# Patient Record
Sex: Female | Born: 1945 | Race: White | Hispanic: No | Marital: Married | Smoking: Former smoker
Health system: Southern US, Community
[De-identification: ages and names within clinical notes are randomized; demographics above are authoritative.]

## PROBLEM LIST (undated history)

## (undated) DIAGNOSIS — M1990 Unspecified osteoarthritis, unspecified site: Secondary | ICD-10-CM

## (undated) DIAGNOSIS — F32A Depression, unspecified: Secondary | ICD-10-CM

## (undated) DIAGNOSIS — I6523 Occlusion and stenosis of bilateral carotid arteries: Secondary | ICD-10-CM

## (undated) DIAGNOSIS — N3281 Overactive bladder: Secondary | ICD-10-CM

## (undated) DIAGNOSIS — N3941 Urge incontinence: Secondary | ICD-10-CM

## (undated) HISTORY — DX: Urge incontinence: N39.41

## (undated) HISTORY — PX: ELBOW FRACTURE SURGERY: SHX616

## (undated) HISTORY — PX: FRACTURE SURGERY: SHX138

## (undated) HISTORY — PX: BUNIONECTOMY: SHX129

## (undated) HISTORY — PX: COLONOSCOPY: SHX174

---

## 2002-03-15 HISTORY — PX: BREAST BIOPSY: SHX20

## 2009-05-30 LAB — HM COLONOSCOPY

## 2011-06-08 DIAGNOSIS — M25529 Pain in unspecified elbow: Secondary | ICD-10-CM | POA: Insufficient documentation

## 2011-11-10 DIAGNOSIS — S42409A Unspecified fracture of lower end of unspecified humerus, initial encounter for closed fracture: Secondary | ICD-10-CM | POA: Insufficient documentation

## 2011-11-10 HISTORY — DX: Unspecified fracture of lower end of unspecified humerus, initial encounter for closed fracture: S42.409A

## 2013-03-15 HISTORY — PX: OTHER SURGICAL HISTORY: SHX169

## 2016-03-24 DIAGNOSIS — Z1231 Encounter for screening mammogram for malignant neoplasm of breast: Secondary | ICD-10-CM | POA: Diagnosis not present

## 2016-04-06 DIAGNOSIS — N3281 Overactive bladder: Secondary | ICD-10-CM | POA: Diagnosis not present

## 2016-04-06 DIAGNOSIS — Z0000 Encounter for general adult medical examination without abnormal findings: Secondary | ICD-10-CM | POA: Diagnosis not present

## 2016-04-06 DIAGNOSIS — G478 Other sleep disorders: Secondary | ICD-10-CM | POA: Diagnosis not present

## 2016-04-06 DIAGNOSIS — E784 Other hyperlipidemia: Secondary | ICD-10-CM | POA: Diagnosis not present

## 2016-04-06 DIAGNOSIS — K5730 Diverticulosis of large intestine without perforation or abscess without bleeding: Secondary | ICD-10-CM | POA: Diagnosis not present

## 2016-06-14 DIAGNOSIS — M79671 Pain in right foot: Secondary | ICD-10-CM | POA: Diagnosis not present

## 2016-06-14 DIAGNOSIS — M2011 Hallux valgus (acquired), right foot: Secondary | ICD-10-CM | POA: Diagnosis not present

## 2016-09-10 DIAGNOSIS — M19071 Primary osteoarthritis, right ankle and foot: Secondary | ICD-10-CM | POA: Diagnosis not present

## 2016-09-10 DIAGNOSIS — M2010 Hallux valgus (acquired), unspecified foot: Secondary | ICD-10-CM | POA: Insufficient documentation

## 2016-09-10 DIAGNOSIS — M2021 Hallux rigidus, right foot: Secondary | ICD-10-CM | POA: Diagnosis not present

## 2016-09-10 DIAGNOSIS — G8918 Other acute postprocedural pain: Secondary | ICD-10-CM | POA: Diagnosis not present

## 2016-09-10 DIAGNOSIS — M79671 Pain in right foot: Secondary | ICD-10-CM | POA: Diagnosis not present

## 2016-09-10 DIAGNOSIS — N393 Stress incontinence (female) (male): Secondary | ICD-10-CM | POA: Diagnosis not present

## 2016-09-10 DIAGNOSIS — Z87891 Personal history of nicotine dependence: Secondary | ICD-10-CM | POA: Diagnosis not present

## 2016-09-24 DIAGNOSIS — Z09 Encounter for follow-up examination after completed treatment for conditions other than malignant neoplasm: Secondary | ICD-10-CM | POA: Diagnosis not present

## 2016-10-13 DIAGNOSIS — Z09 Encounter for follow-up examination after completed treatment for conditions other than malignant neoplasm: Secondary | ICD-10-CM | POA: Diagnosis not present

## 2016-10-13 DIAGNOSIS — M79671 Pain in right foot: Secondary | ICD-10-CM | POA: Diagnosis not present

## 2016-12-21 DIAGNOSIS — Z09 Encounter for follow-up examination after completed treatment for conditions other than malignant neoplasm: Secondary | ICD-10-CM | POA: Diagnosis not present

## 2016-12-31 DIAGNOSIS — Z23 Encounter for immunization: Secondary | ICD-10-CM | POA: Diagnosis not present

## 2017-02-18 DIAGNOSIS — E559 Vitamin D deficiency, unspecified: Secondary | ICD-10-CM | POA: Diagnosis not present

## 2017-02-18 DIAGNOSIS — R350 Frequency of micturition: Secondary | ICD-10-CM | POA: Diagnosis not present

## 2017-02-18 DIAGNOSIS — E785 Hyperlipidemia, unspecified: Secondary | ICD-10-CM | POA: Diagnosis not present

## 2017-02-18 DIAGNOSIS — I10 Essential (primary) hypertension: Secondary | ICD-10-CM | POA: Diagnosis not present

## 2017-02-25 DIAGNOSIS — E559 Vitamin D deficiency, unspecified: Secondary | ICD-10-CM | POA: Diagnosis not present

## 2017-02-25 DIAGNOSIS — R350 Frequency of micturition: Secondary | ICD-10-CM | POA: Diagnosis not present

## 2017-02-25 DIAGNOSIS — E785 Hyperlipidemia, unspecified: Secondary | ICD-10-CM | POA: Diagnosis not present

## 2017-02-25 HISTORY — DX: Hyperlipidemia, unspecified: E78.5

## 2017-03-30 DIAGNOSIS — Z1231 Encounter for screening mammogram for malignant neoplasm of breast: Secondary | ICD-10-CM | POA: Diagnosis not present

## 2017-11-29 ENCOUNTER — Emergency Department
Admission: EM | Admit: 2017-11-29 | Discharge: 2017-11-29 | Disposition: A | Discharge status: Home / Self Care | Payer: Medicare Other | Attending: Emergency Medicine | Admitting: Emergency Medicine

## 2017-11-29 ENCOUNTER — Other Ambulatory Visit: Payer: Self-pay

## 2017-11-29 ENCOUNTER — Emergency Department: Payer: Medicare Other

## 2017-11-29 DIAGNOSIS — Y929 Unspecified place or not applicable: Secondary | ICD-10-CM | POA: Insufficient documentation

## 2017-11-29 DIAGNOSIS — S52021A Displaced fracture of olecranon process without intraarticular extension of right ulna, initial encounter for closed fracture: Secondary | ICD-10-CM | POA: Diagnosis not present

## 2017-11-29 DIAGNOSIS — W0110XA Fall on same level from slipping, tripping and stumbling with subsequent striking against unspecified object, initial encounter: Secondary | ICD-10-CM | POA: Insufficient documentation

## 2017-11-29 DIAGNOSIS — Z23 Encounter for immunization: Secondary | ICD-10-CM | POA: Diagnosis not present

## 2017-11-29 DIAGNOSIS — Y999 Unspecified external cause status: Secondary | ICD-10-CM | POA: Insufficient documentation

## 2017-11-29 DIAGNOSIS — Y939 Activity, unspecified: Secondary | ICD-10-CM | POA: Insufficient documentation

## 2017-11-29 DIAGNOSIS — S59902A Unspecified injury of left elbow, initial encounter: Secondary | ICD-10-CM | POA: Diagnosis present

## 2017-11-29 MED ORDER — CEPHALEXIN 500 MG PO CAPS: 1000.0000 mg | ORAL_CAPSULE | Freq: Two times a day (BID) | ORAL | 0 refills | Status: DC

## 2017-11-29 MED ORDER — OXYCODONE-ACETAMINOPHEN 5-325 MG PO TABS
1.0000 | ORAL_TABLET | Freq: Once | ORAL | Status: AC
  Administered 2017-11-29: 1 via ORAL
  Filled 2017-11-29: qty 1

## 2017-11-29 MED ORDER — TETANUS-DIPHTH-ACELL PERTUSSIS 5-2.5-18.5 LF-MCG/0.5 IM SUSP
0.5000 mL | Freq: Once | INTRAMUSCULAR | Status: AC
  Administered 2017-11-29: 0.5 mL via INTRAMUSCULAR
  Filled 2017-11-29: qty 0.5

## 2017-11-29 MED ORDER — OXYCODONE-ACETAMINOPHEN 5-325 MG PO TABS: 1.0000 | ORAL_TABLET | Freq: Four times a day (QID) | ORAL | 0 refills | Status: DC | PRN

## 2017-11-29 MED ORDER — ONDANSETRON 8 MG PO TBDP
8.0000 mg | ORAL_TABLET | Freq: Once | ORAL | Status: AC
  Administered 2017-11-29: 8 mg via ORAL
  Filled 2017-11-29: qty 1

## 2017-11-29 NOTE — ED Provider Notes (Signed)
[facility] Emergency Department Provider Note  ____________________________________________  Time seen: Approximately 6:42 PM  I have reviewed the triage vital signs and the nursing notes.   HISTORY  Chief Complaint Fall    HPI Julia Snyder is a 72 y.o. female who presents the emergency department complaining of right elbow injury.  Patient reports that she was walking, believes she tripped over an unknown object, falling and landing on her elbow.  Patient reports immediate pain to the area and inability to move due to the significant amount of pain.  Patient did not hit her head or lose consciousness.  She did sustain abrasions to the forearm and elbow.  No active bleeding.  She is unsure of her last tetanus shot.  Patient's main complaint at this time is severe elbow pain.  Patient had significant surgery to the left elbow but has had no right elbow/arm pain.    History reviewed. No pertinent past medical history.  There are no active problems to display for this patient.   Past Surgical History:  Procedure Laterality Date  . OTHER SURGICAL HISTORY  2015   left arm surgery with titanium plate    Prior to Admission medications   Medication Sig Start Date End Date Taking? Authorizing Provider  cephALEXin (KEFLEX) 500 MG capsule Take 2 capsules (1,000 mg total) by mouth 2 (two) times daily. 11/29/17   Cuthriell, Delorise RoyalsJonathan D, PA-C  oxyCODONE-acetaminophen (PERCOCET/ROXICET) 5-325 MG tablet Take 1 tablet by mouth every 6 (six) hours as needed for severe pain. 11/29/17   Cuthriell, Delorise RoyalsJonathan D, PA-C    Allergies Patient has no allergy information on record.  History reviewed. No pertinent family history.  Social History Social History   Tobacco Use  . Smoking status: Never Smoker  . Smokeless tobacco: Never Used  Substance Use Topics  . Alcohol use: Yes  . Drug use: Never     Review of Systems  Constitutional: No fever/chills Eyes: No visual  changes.  Cardiovascular: no chest pain. Respiratory: no cough. No SOB. Gastrointestinal: No abdominal pain.  No nausea, no vomiting.   Musculoskeletal: Positive for right elbow injury Skin: Abrasions to the right elbow and forearm Neurological: Negative for headaches, focal weakness or numbness. 10-point ROS otherwise negative.  ____________________________________________   PHYSICAL EXAM:  VITAL SIGNS: ED Triage Vitals  Enc Vitals Group     BP 11/29/17 1736 (!) 134/54     Pulse Rate 11/29/17 1736 (!) 54     Resp 11/29/17 1736 20     Temp 11/29/17 1736 97.8 F (36.6 C)     Temp Source 11/29/17 1736 Oral     SpO2 11/29/17 1736 99 %     Weight 11/29/17 1731 121 lb (54.9 kg)     Height 11/29/17 1731 5\' 2"  (1.575 m)     Head Circumference --      Peak Flow --      Pain Score 11/29/17 1729 10     Pain Loc --      Pain Edu? --      Excl. in GC? --      Constitutional: Alert and oriented. Well appearing and in no acute distress. Eyes: Conjunctivae are normal. PERRL. EOMI. Head: Atraumatic. Neck: No stridor.    Cardiovascular: Normal rate, regular rhythm. Normal S1 and S2.  Good peripheral circulation. Respiratory: Normal respiratory effort without tachypnea or retractions. Lungs CTAB. Good air entry to the bases with no decreased or absent breath sounds. Musculoskeletal: Full range of  motion to all extremities. No gross deformities appreciated.  Visualization of the right elbow reveals edema to the posterior aspect.  Patient does have superficial abrasions from elbow into the forearm.  No range of motion due to pain.  Palpation along the elbow reveals posteriorly displaced olecranon but no other palpable abnormality.  Extreme tenderness to palpation in this region.  No range of motion testing is performed.  No other significant tenderness or palpable abnormality to the right elbow.  Examination of the right shoulder and right wrist is unremarkable.  Radial pulse intact distally.   Sensation intact distally. Neurologic:  Normal speech and language. No gross focal neurologic deficits are appreciated.  Skin:  Skin is warm, dry and intact. No rash noted. Psychiatric: Mood and affect are normal. Speech and behavior are normal. Patient exhibits appropriate insight and judgement.   ____________________________________________   LABS (all labs ordered are listed, but only abnormal results are displayed)  Labs Reviewed - No data to display ____________________________________________  EKG   ____________________________________________  RADIOLOGY I personally viewed and evaluated these images as part of my medical decision making, as well as reviewing the written report by the radiologist.  I concur with radiologist finding of comminuted olecranon fracture with posterior displacement.  Visualized wedge-shaped fracture fragment is also appreciated.  Dg Elbow 2 Views Right  Result Date: 11/29/2017 CLINICAL DATA:  Right elbow pain after fall EXAM: RIGHT ELBOW - 2 VIEW COMPARISON:  None. FINDINGS: Acute, closed, comminuted fracture of the olecranon with 4 cm of distraction of the main fracture fragments. A wedge shaped fracture fragment measuring 18 x 18 x 9 mm is seen interposed within the gap created by the fracture. The radius and distal humerus appear intact. IMPRESSION: Acute, comminuted fracture of the olecranon with 4 cm of distraction noted. An 18 x 18 x 9 mm wedge-shaped fracture fragment is also seen interposed between the main fracture fragments. Electronically Signed   By: Tollie Eth M.D.   On: 11/29/2017 18:16    ____________________________________________    PROCEDURES  Procedure(s) performed:    .Splint Application Date/Time: 11/29/2017 7:12 PM Performed by: Racheal Patches, PA-C Authorized by: Racheal Patches, PA-C   Consent:    Consent obtained:  Verbal   Consent given by:  Patient   Risks discussed:  Pain and swelling Pre-procedure  details:    Sensation:  Normal Procedure details:    Laterality:  Right   Location:  Elbow   Elbow:  R elbow   Splint type:  Long arm   Supplies:  Cotton padding, Ortho-Glass and elastic bandage Post-procedure details:    Pain:  Improved   Sensation:  Normal   Patient tolerance of procedure:  Tolerated well, no immediate complications      Medications  oxyCODONE-acetaminophen (PERCOCET/ROXICET) 5-325 MG per tablet 1 tablet (1 tablet Oral Given 11/29/17 1910)  ondansetron (ZOFRAN-ODT) disintegrating tablet 8 mg (8 mg Oral Given 11/29/17 1912)  Tdap (BOOSTRIX) injection 0.5 mL (0.5 mLs Intramuscular Given 11/29/17 1911)     ____________________________________________   INITIAL IMPRESSION / ASSESSMENT AND PLAN / ED COURSE  Pertinent labs & imaging results that were available during my care of the patient were reviewed by me and considered in my medical decision making (see chart for details).  Review of the [place] CSRS was performed in accordance of the [facility] prior to dispensing any controlled drugs.      Patient's diagnosis is consistent with olecranon fracture.  Patient presents the emergency department with injury  to the right elbow.  Palpation revealed palpable deformity around the olecranon process.  Imaging reveals comminuted fracture with posterior displacement.  Patient will be splinted with long-arm posterior OCL splint.  Patient is updated with tetanus shot.  She will be prescribed pain medication, antibiotics prophylactically.. I discussed the case with on-call orthopedic surgeon who advises that he will follow the patient in the morning. Patient is given ED precautions to return to the ED for any worsening or new symptoms.     ____________________________________________  FINAL CLINICAL IMPRESSION(S) / ED DIAGNOSES  Final diagnoses:  Closed fracture of right olecranon process, initial encounter      NEW MEDICATIONS STARTED DURING THIS VISIT:  ED Discharge Orders          Ordered    cephALEXin (KEFLEX) 500 MG capsule  2 times daily     11/29/17 1959    oxyCODONE-acetaminophen (PERCOCET/ROXICET) 5-325 MG tablet  Every 6 hours PRN     11/29/17 1959              This chart was dictated using voice recognition software/Dragon. Despite best efforts to proofread, errors can occur which can change the meaning. Any change was purely unintentional.    Racheal Patches, PA-C 11/29/17 2353    Jeanmarie Plant, MD 11/30/17 0000

## 2017-11-29 NOTE — ED Triage Notes (Signed)
Pt arrives to ed via POV. Pt states she tripped over shoe on sidewalk. Pt a&o x 4. Pt denies hitting head, no LOC. Ice pack on right arm. Pt stated hit elbow on concrete, skin abrasions and bruising noted to the right elbow and forearm, currently not bleeding.

## 2017-11-30 DIAGNOSIS — W101XXA Fall (on)(from) sidewalk curb, initial encounter: Secondary | ICD-10-CM | POA: Diagnosis not present

## 2017-11-30 DIAGNOSIS — S52031A Displaced fracture of olecranon process with intraarticular extension of right ulna, initial encounter for closed fracture: Secondary | ICD-10-CM | POA: Diagnosis not present

## 2017-12-01 ENCOUNTER — Ambulatory Visit: Payer: Medicare Other

## 2017-12-01 ENCOUNTER — Ambulatory Visit: Payer: Medicare Other | Admitting: Anesthesiology

## 2017-12-01 ENCOUNTER — Encounter
Admission: RE | Disposition: A | Discharge status: Home / Self Care | Payer: Self-pay | Source: Ambulatory Visit | Attending: Orthopedic Surgery

## 2017-12-01 ENCOUNTER — Ambulatory Visit
Admission: RE | Admit: 2017-12-01 | Discharge: 2017-12-01 | Disposition: A | Discharge status: Home / Self Care | Payer: Medicare Other | Source: Ambulatory Visit | Attending: Orthopedic Surgery | Admitting: Orthopedic Surgery

## 2017-12-01 ENCOUNTER — Other Ambulatory Visit: Payer: Self-pay

## 2017-12-01 ENCOUNTER — Encounter: Payer: Self-pay | Admitting: *Deleted

## 2017-12-01 DIAGNOSIS — S52001A Unspecified fracture of upper end of right ulna, initial encounter for closed fracture: Secondary | ICD-10-CM | POA: Diagnosis not present

## 2017-12-01 DIAGNOSIS — Z79899 Other long term (current) drug therapy: Secondary | ICD-10-CM | POA: Diagnosis not present

## 2017-12-01 DIAGNOSIS — S52031A Displaced fracture of olecranon process with intraarticular extension of right ulna, initial encounter for closed fracture: Secondary | ICD-10-CM | POA: Diagnosis not present

## 2017-12-01 DIAGNOSIS — W010XXA Fall on same level from slipping, tripping and stumbling without subsequent striking against object, initial encounter: Secondary | ICD-10-CM | POA: Diagnosis not present

## 2017-12-01 DIAGNOSIS — Z8781 Personal history of (healed) traumatic fracture: Secondary | ICD-10-CM

## 2017-12-01 DIAGNOSIS — N3281 Overactive bladder: Secondary | ICD-10-CM | POA: Diagnosis not present

## 2017-12-01 DIAGNOSIS — Y9289 Other specified places as the place of occurrence of the external cause: Secondary | ICD-10-CM | POA: Diagnosis not present

## 2017-12-01 DIAGNOSIS — Z9889 Other specified postprocedural states: Secondary | ICD-10-CM

## 2017-12-01 DIAGNOSIS — S52021A Displaced fracture of olecranon process without intraarticular extension of right ulna, initial encounter for closed fracture: Secondary | ICD-10-CM | POA: Diagnosis not present

## 2017-12-01 DIAGNOSIS — S52011A Torus fracture of upper end of right ulna, initial encounter for closed fracture: Secondary | ICD-10-CM | POA: Diagnosis not present

## 2017-12-01 DIAGNOSIS — Z01810 Encounter for preprocedural cardiovascular examination: Secondary | ICD-10-CM | POA: Diagnosis not present

## 2017-12-01 HISTORY — PX: ORIF ELBOW FRACTURE: SHX5031

## 2017-12-01 HISTORY — DX: Overactive bladder: N32.81

## 2017-12-01 SURGERY — OPEN REDUCTION INTERNAL FIXATION (ORIF) ELBOW/OLECRANON FRACTURE
Anesthesia: General | Site: Elbow | Laterality: Right

## 2017-12-01 MED ORDER — CEFAZOLIN SODIUM-DEXTROSE 2-4 GM/100ML-% IV SOLN
INTRAVENOUS | Status: AC
  Filled 2017-12-01: qty 100

## 2017-12-01 MED ORDER — ACETAMINOPHEN 10 MG/ML IV SOLN
INTRAVENOUS | Status: DC | PRN
  Administered 2017-12-01: 1000 mg via INTRAVENOUS

## 2017-12-01 MED ORDER — FENTANYL CITRATE (PF) 100 MCG/2ML IJ SOLN
INTRAMUSCULAR | Status: AC
  Administered 2017-12-01: 25 ug via INTRAVENOUS
  Filled 2017-12-01: qty 2

## 2017-12-01 MED ORDER — MIDAZOLAM HCL 2 MG/2ML IJ SOLN
INTRAMUSCULAR | Status: AC
  Administered 2017-12-01: 2 mg via INTRAVENOUS
  Filled 2017-12-01: qty 2

## 2017-12-01 MED ORDER — BUPIVACAINE LIPOSOME 1.3 % IJ SUSP
INTRAMUSCULAR | Status: AC
  Filled 2017-12-01: qty 20

## 2017-12-01 MED ORDER — LIDOCAINE HCL (PF) 1 % IJ SOLN
INTRAMUSCULAR | Status: AC
  Filled 2017-12-01: qty 5

## 2017-12-01 MED ORDER — OXYCODONE-ACETAMINOPHEN 5-325 MG PO TABS: 1.0000 | ORAL_TABLET | ORAL | 0 refills | Status: DC | PRN

## 2017-12-01 MED ORDER — NEOMYCIN-POLYMYXIN B GU 40-200000 IR SOLN
Status: DC | PRN
  Administered 2017-12-01: 2 mL

## 2017-12-01 MED ORDER — CEFAZOLIN SODIUM-DEXTROSE 2-4 GM/100ML-% IV SOLN
2.0000 g | Freq: Once | INTRAVENOUS | Status: AC
  Administered 2017-12-01: 2 g via INTRAVENOUS

## 2017-12-01 MED ORDER — ACETAMINOPHEN 10 MG/ML IV SOLN
INTRAVENOUS | Status: AC
  Filled 2017-12-01: qty 100

## 2017-12-01 MED ORDER — GLYCOPYRROLATE 0.2 MG/ML IJ SOLN
INTRAMUSCULAR | Status: DC | PRN
  Administered 2017-12-01: 0.2 mg via INTRAVENOUS

## 2017-12-01 MED ORDER — ONDANSETRON HCL 4 MG/2ML IJ SOLN
INTRAMUSCULAR | Status: DC | PRN
  Administered 2017-12-01: 4 mg via INTRAVENOUS

## 2017-12-01 MED ORDER — BUPIVACAINE HCL (PF) 0.5 % IJ SOLN
INTRAMUSCULAR | Status: DC | PRN
  Administered 2017-12-01: 10 mL via PERINEURAL
  Administered 2017-12-01: 10 mL

## 2017-12-01 MED ORDER — FENTANYL CITRATE (PF) 100 MCG/2ML IJ SOLN
25.0000 ug | Freq: Once | INTRAMUSCULAR | Status: AC
  Administered 2017-12-01: 25 ug via INTRAVENOUS

## 2017-12-01 MED ORDER — FENTANYL CITRATE (PF) 100 MCG/2ML IJ SOLN
INTRAMUSCULAR | Status: AC
  Filled 2017-12-01: qty 2

## 2017-12-01 MED ORDER — DEXAMETHASONE SODIUM PHOSPHATE 10 MG/ML IJ SOLN
INTRAMUSCULAR | Status: DC | PRN
  Administered 2017-12-01: 10 mg via INTRAVENOUS

## 2017-12-01 MED ORDER — MIDAZOLAM HCL 2 MG/2ML IJ SOLN
2.0000 mg | Freq: Once | INTRAMUSCULAR | Status: AC
  Administered 2017-12-01: 2 mg via INTRAVENOUS

## 2017-12-01 MED ORDER — LACTATED RINGERS IV SOLN
INTRAVENOUS | Status: DC
  Administered 2017-12-01: 14:00:00 via INTRAVENOUS

## 2017-12-01 MED ORDER — FAMOTIDINE 20 MG PO TABS: 20.0000 mg | ORAL_TABLET | Freq: Once | ORAL | Status: DC

## 2017-12-01 MED ORDER — BUPIVACAINE HCL (PF) 0.5 % IJ SOLN
INTRAMUSCULAR | Status: AC
  Filled 2017-12-01: qty 10

## 2017-12-01 MED ORDER — EPHEDRINE SULFATE 50 MG/ML IJ SOLN
INTRAMUSCULAR | Status: DC | PRN
  Administered 2017-12-01: 5 mg via INTRAVENOUS

## 2017-12-01 MED ORDER — NEOMYCIN-POLYMYXIN B GU 40-200000 IR SOLN
Status: AC
  Filled 2017-12-01: qty 2

## 2017-12-01 MED ORDER — SEVOFLURANE IN SOLN
RESPIRATORY_TRACT | Status: AC
  Filled 2017-12-01: qty 500

## 2017-12-01 MED ORDER — PROPOFOL 10 MG/ML IV BOLUS
INTRAVENOUS | Status: DC | PRN
  Administered 2017-12-01: 200 mg via INTRAVENOUS

## 2017-12-01 MED ORDER — BUPIVACAINE LIPOSOME 1.3 % IJ SUSP
INTRAMUSCULAR | Status: DC | PRN
  Administered 2017-12-01: 20 mL via PERINEURAL

## 2017-12-01 SURGICAL SUPPLY — 50 items
BANDAGE ELASTIC 4 LF NS (GAUZE/BANDAGES/DRESSINGS) ×6 IMPLANT
BIT DRILL 2.5X2.75 QC CALB (BIT) ×2 IMPLANT
BIT DRILL CALIBRATED 2.7 (BIT) ×1 IMPLANT
BIT DRILL CALIBRATED 2.7MM (BIT) ×1
CANISTER SUCT 1200ML W/VALVE (MISCELLANEOUS) ×3 IMPLANT
CHLORAPREP W/TINT 26ML (MISCELLANEOUS) ×3 IMPLANT
CLOSURE WOUND 1/2 X4 (GAUZE/BANDAGES/DRESSINGS) ×1
CUFF TOURN 18 STER (MISCELLANEOUS) ×3 IMPLANT
CUFF TOURN 24 STER (MISCELLANEOUS) ×3 IMPLANT
DRAPE C-ARM XRAY 36X54 (DRAPES) ×3 IMPLANT
DRAPE SHEET LG 3/4 BI-LAMINATE (DRAPES) ×3 IMPLANT
ELECT CAUTERY BLADE 6.4 (BLADE) ×3 IMPLANT
ELECT REM PT RETURN 9FT ADLT (ELECTROSURGICAL) ×3
ELECTRODE REM PT RTRN 9FT ADLT (ELECTROSURGICAL) ×1 IMPLANT
GAUZE PETRO XEROFOAM 1X8 (MISCELLANEOUS) ×3 IMPLANT
GAUZE SPONGE 4X4 12PLY STRL (GAUZE/BANDAGES/DRESSINGS) ×3 IMPLANT
GLOVE SURG SYN 9.0  PF PI (GLOVE) ×2
GLOVE SURG SYN 9.0 PF PI (GLOVE) ×1 IMPLANT
GOWN SRG 2XL LVL 4 RGLN SLV (GOWNS) ×1 IMPLANT
GOWN STRL NON-REIN 2XL LVL4 (GOWNS) ×2
GOWN STRL REUS W/ TWL LRG LVL3 (GOWN DISPOSABLE) ×1 IMPLANT
GOWN STRL REUS W/TWL LRG LVL3 (GOWN DISPOSABLE) ×2
K-WIRE FIXATION 2.0X6 (WIRE) ×3
KIT TURNOVER KIT A (KITS) ×3 IMPLANT
KWIRE FIXATION 2.0X6 (WIRE) IMPLANT
NDL FILTER BLUNT 18X1 1/2 (NEEDLE) ×1 IMPLANT
NEEDLE FILTER BLUNT 18X 1/2SAF (NEEDLE) ×2
NEEDLE FILTER BLUNT 18X1 1/2 (NEEDLE) ×1 IMPLANT
NS IRRIG 500ML POUR BTL (IV SOLUTION) ×3 IMPLANT
PACK EXTREMITY ARMC (MISCELLANEOUS) ×3 IMPLANT
PAD ABD DERMACEA PRESS 5X9 (GAUZE/BANDAGES/DRESSINGS) ×6 IMPLANT
PAD CAST CTTN 4X4 STRL (SOFTGOODS) ×3 IMPLANT
PAD PREP 24X41 OB/GYN DISP (PERSONAL CARE ITEMS) ×3 IMPLANT
PADDING CAST COTTON 4X4 STRL (SOFTGOODS) ×6
PLATE OLECRANON LRG (Plate) ×2 IMPLANT
SCALPEL PROTECTED #15 DISP (BLADE) ×6 IMPLANT
SCREW LOCK CORT STAR 3.5X10 (Screw) ×4 IMPLANT
SCREW LOCK CORT STAR 3.5X12 (Screw) ×4 IMPLANT
SCREW LOW PROFILE 22MMX3.5MM (Screw) ×2 IMPLANT
SCREW NON LOCKING LP 3.5 16MM (Screw) ×6 IMPLANT
SPLINT CAST 1 STEP 5X30 WHT (MISCELLANEOUS) ×3 IMPLANT
SPONGE LAP 18X18 RF (DISPOSABLE) ×3 IMPLANT
STAPLER SKIN PROX 35W (STAPLE) ×3 IMPLANT
STRIP CLOSURE SKIN 1/2X4 (GAUZE/BANDAGES/DRESSINGS) ×2 IMPLANT
SUT ETHILON 3-0 FS-10 30 BLK (SUTURE) ×3
SUT VIC AB 0 CT2 27 (SUTURE) ×3 IMPLANT
SUT VIC AB 2-0 CT2 27 (SUTURE) ×3 IMPLANT
SUTURE EHLN 3-0 FS-10 30 BLK (SUTURE) ×1 IMPLANT
SYR 5ML LL (SYRINGE) ×3 IMPLANT
WASHER 3.5MM (Orthopedic Implant) ×4 IMPLANT

## 2017-12-01 NOTE — H&P (Signed)
Reviewed paper H+P, will be scanned into chart. No changes noted.  

## 2017-12-01 NOTE — Op Note (Signed)
12/01/2017  4:14 PM  PATIENT:  Julia LemmingLinda L Dils  72 y.o. female  PRE-OPERATIVE DIAGNOSIS: Right olecranon fracture comminuted intra-articular  POST-OPERATIVE DIAGNOSIS: Same  PROCEDURE:  Procedure(s): OPEN REDUCTION INTERNAL FIXATION (ORIF) ELBOW/OLECRANON FRACTURE (Right)  SURGEON: Leitha SchullerMichael J Jaydah Stahle, MD  ASSISTANTS: None  ANESTHESIA:   general  EBL:  Total I/O In: 900 [I.V.:900] Out: 50 [Blood:50]  BLOOD ADMINISTERED:none  DRAINS: none   LOCAL MEDICATIONS USED:  OTHER preop block given  SPECIMEN:  No Specimen  DISPOSITION OF SPECIMEN:  N/A  COUNTS:  YES  TOURNIQUET:  * No tourniquets in log *  IMPLANTS: Biomet locking olecranon plate long multiple screws  DICTATION: .Dragon Dictation patient was brought to the operating room and after adequate general anesthesia was obtained the right arm was prepped and draped you sterile fashion.  After patient identification and timeout procedures were completed, the right olecranon was exposed through a curvilinear incision extended distally along the shaft of the ulna.  The subcutaneous tissue was spread and there is a large hematoma after this was evacuated the fracture fragments were well identified and there were 4 main fragments the shaft the olecranon and then to intra-articular fragments 1 of which was flipped 180 degrees when this was flipped over and held in position without soft tissue stripping a K wire was sent through the olecranon up to the shaft and this gave almost anatomic alignment immediately.  The long plate was required to get adequate fixation of the shaft from the Biomet locking olecranon plate and the proximal end was bent to better grasp onto the olecranon with the olecranon held in reduced position 2 locking screws were placed followed by a shaft screw to get compression at the fracture.  Care was taken to not over compress the joint.  Next additional nonlocking screws were placed in the shaft and locking screws  obliquely into the olecranon with the set screws placed the K wire could be removed and the fracture was stable.  2 additional locking screws were placed to fix the additional intervening fragments and with essentially anatomic reduction of the joint no hardware prominence into the joint or soft tissues and full range of motion with stable fixation.  The wound was thoroughly irrigated and all fast guides that were not utilized were removed at this time as well as 1 of the tabs on the more ulnar side of the plate.  The wound was closed with 0 Vicryl over the triceps insertion were then split to allow for application of the plate, 3-0 Vicryl subcutaneously with 4-0 nylon for the skin.  Xeroform was placed over a forearm abrasion and Adaptic over the large abrasion and skin incision.  4 x 4's web roll and anterior and posterior splints applied with the elbow at 90 degrees of flexion followed by Ace wrap.  Patient was sent to recovery in stable condition  PLAN OF CARE: Discharge to home after PACU  PATIENT DISPOSITION:  PACU - hemodynamically stable.

## 2017-12-01 NOTE — Transfer of Care (Signed)
Immediate Anesthesia Transfer of Care Note  Patient: Julia LemmingLinda L Snyder  Procedure(s) Performed: OPEN REDUCTION INTERNAL FIXATION (ORIF) ELBOW/OLECRANON FRACTURE (Right Elbow)  Patient Location: PACU  Anesthesia Type:General  Level of Consciousness: sedated  Airway & Oxygen Therapy: Patient Spontanous Breathing and Patient connected to face mask oxygen  Post-op Assessment: Report given to RN and Post -op Vital signs reviewed and stable  Post vital signs: Reviewed and stable  Last Vitals:  Vitals Value Taken Time  BP 138/58 12/01/2017  4:11 PM  Temp 36.3 C 12/01/2017  4:11 PM  Pulse 58 12/01/2017  4:12 PM  Resp 11 12/01/2017  4:12 PM  SpO2 100 % 12/01/2017  4:12 PM  Vitals shown include unvalidated device data.  Last Pain:  Vitals:   12/01/17 1411  TempSrc:   PainSc: 0-No pain         Complications: No apparent anesthesia complications

## 2017-12-01 NOTE — Discharge Instructions (Addendum)
Keep splint clean and dry.  Try to move fingers as much as possible.  Elevate arm.  Okay to put ice on the back of the elbow.  Pain medicine as directed.  AMBULATORY SURGERY  DISCHARGE INSTRUCTIONS   1) The drugs that you were given will stay in your system until tomorrow so for the next 24 hours you should not:  A) Drive an automobile B) Make any legal decisions C) Drink any alcoholic beverage   2) You may resume regular meals tomorrow.  Today it is better to start with liquids and gradually work up to solid foods.  You may eat anything you prefer, but it is better to start with liquids, then soup and crackers, and gradually work up to solid foods.   3) Please notify your doctor immediately if you have any unusual bleeding, trouble breathing, redness and pain at the surgery site, drainage, fever, or pain not relieved by medication.    4) Additional Instructions:        Please contact your physician with any problems or Same Day Surgery at [phone], Monday through Friday 6 am to 4 pm, or [place] [facility] Main number at [phone].

## 2017-12-01 NOTE — Anesthesia Post-op Follow-up Note (Signed)
Anesthesia QCDR form completed.        

## 2017-12-01 NOTE — Anesthesia Procedure Notes (Signed)
Anesthesia Regional Block: Interscalene brachial plexus block   Pre-Anesthetic Checklist: ,, timeout performed, Correct Patient, Correct Site, Correct Laterality, Correct Procedure, Correct Position, site marked, Risks and benefits discussed,  Surgical consent,  Pre-op evaluation,  At surgeon's request and post-op pain management  Laterality: Left  Prep: chloraprep       Needles:  Injection technique: Single-shot  Needle Type: Echogenic Stimulator Needle     Needle Length: 10cm  Needle Gauge: 20     Additional Needles:   Procedures:, nerve stimulator,,, ultrasound used (permanent image in chart),,,,   Nerve Stimulator or Paresthesia:  Response: biceps flexion,   Additional Responses:   Narrative:  Injection made incrementally with aspirations every 5 mL.  Performed by: Personally  Anesthesiologist: Yevette EdwardsAdams, Deagen Krass G, MD  Additional Notes: Functioning IV was confirmed and monitors were applied. . Sterile prep and drape,hand hygiene and sterile gloves were used.  Negative aspiration and negative test dose prior to incremental administration with easy injection and minimal pressure with the  local anesthetic. The patient tolerated the procedure well.

## 2017-12-01 NOTE — Anesthesia Preprocedure Evaluation (Addendum)
Anesthesia Evaluation  Patient identified by MRN, date of birth, ID band Patient awake    Reviewed: Allergy & Precautions, H&P , NPO status , Patient's Chart, lab work & pertinent test results, reviewed documented beta blocker date and time   Airway Mallampati: II  TM Distance: >3 FB Neck ROM: full    Dental  (+) Teeth Intact   Pulmonary neg pulmonary ROS,    Pulmonary exam normal        Cardiovascular negative cardio ROS Normal cardiovascular exam Rhythm:regular Rate:Normal     Neuro/Psych  Neuromuscular disease negative neurological ROS  negative psych ROS   GI/Hepatic negative GI ROS, Neg liver ROS,   Endo/Other  negative endocrine ROS  Renal/GU negative Renal ROS  negative genitourinary   Musculoskeletal   Abdominal   Peds  Hematology negative hematology ROS (+)   Anesthesia Other Findings Past Medical History: No date: Overactive bladder Past Surgical History: No date: COLONOSCOPY 2015: OTHER SURGICAL HISTORY     Comment:  left arm surgery with titanium plate BMI    Body Mass Index:  22.31 kg/m     Reproductive/Obstetrics negative OB ROS                             Anesthesia Physical Anesthesia Plan  ASA: II  Anesthesia Plan: General ETT   Post-op Pain Management:  Regional for Post-op pain   Induction:   PONV Risk Score and Plan: 4 or greater  Airway Management Planned:   Additional Equipment:   Intra-op Plan:   Post-operative Plan:   Informed Consent: I have reviewed the patients History and Physical, chart, labs and discussed the procedure including the risks, benefits and alternatives for the proposed anesthesia with the patient or authorized representative who has indicated his/her understanding and acceptance.   Dental Advisory Given  Plan Discussed with: CRNA  Anesthesia Plan Comments:        Anesthesia Quick Evaluation

## 2017-12-02 ENCOUNTER — Encounter: Payer: Self-pay | Admitting: Orthopedic Surgery

## 2017-12-02 NOTE — Anesthesia Postprocedure Evaluation (Signed)
Anesthesia Post Note  Patient: Julia LemmingLinda L Snyder  Procedure(s) Performed: OPEN REDUCTION INTERNAL FIXATION (ORIF) ELBOW/OLECRANON FRACTURE (Right Elbow)  Patient location during evaluation: PACU Anesthesia Type: General Level of consciousness: awake and alert and oriented Pain management: pain level controlled Vital Signs Assessment: post-procedure vital signs reviewed and stable Respiratory status: spontaneous breathing Cardiovascular status: blood pressure returned to baseline Anesthetic complications: no     Last Vitals:  Vitals:   12/01/17 1702 12/01/17 1726  BP: (!) 141/54 (!) 130/49  Pulse: (!) 52 (!) 51  Resp: 16   Temp:    SpO2: 100% 98%    Last Pain:  Vitals:   12/01/17 1702  TempSrc:   PainSc: 0-No pain                 Jaysen Wey

## 2018-01-02 DIAGNOSIS — S52031D Displaced fracture of olecranon process with intraarticular extension of right ulna, subsequent encounter for closed fracture with routine healing: Secondary | ICD-10-CM | POA: Diagnosis not present

## 2018-01-30 DIAGNOSIS — S52031D Displaced fracture of olecranon process with intraarticular extension of right ulna, subsequent encounter for closed fracture with routine healing: Secondary | ICD-10-CM | POA: Diagnosis not present

## 2018-02-01 DIAGNOSIS — Z23 Encounter for immunization: Secondary | ICD-10-CM | POA: Diagnosis not present

## 2018-02-22 ENCOUNTER — Encounter: Payer: Self-pay | Admitting: Internal Medicine

## 2018-02-22 ENCOUNTER — Ambulatory Visit (INDEPENDENT_AMBULATORY_CARE_PROVIDER_SITE_OTHER): Payer: Medicare Other | Admitting: Internal Medicine

## 2018-02-22 VITALS — BP 108/78 | HR 68 | Temp 98.0°F | Ht 61.5 in | Wt 120.0 lb

## 2018-02-22 DIAGNOSIS — N3941 Urge incontinence: Secondary | ICD-10-CM | POA: Diagnosis not present

## 2018-02-22 DIAGNOSIS — Z7189 Other specified counseling: Secondary | ICD-10-CM | POA: Insufficient documentation

## 2018-02-22 DIAGNOSIS — Z0000 Encounter for general adult medical examination without abnormal findings: Secondary | ICD-10-CM | POA: Diagnosis not present

## 2018-02-22 NOTE — Patient Instructions (Signed)
Please get a copy of your last colonoscopy report. 

## 2018-02-22 NOTE — Progress Notes (Signed)
Subjective:    Patient ID: Julia LemmingLinda L Snyder, female    DOB: [birth year], 72 y.o.   MRN: [id number]  HPI Here to establish care [place] x 5 months Fractured elbowed recently---surgically repaired and doing okay (still sore)  Has urinary incontinence Urge only Some help by the oxybutynin Doesn't need protection while on this  Current Outpatient Medications on File Prior to Visit  Medication Sig Dispense Refill  . Ascorbic Acid (VITAMIN C) 100 MG tablet Take 100 mg by mouth daily.    . Cholecalciferol (VITAMIN D-1000 MAX ST) 25 MCG (1000 UT) tablet Take by mouth.    . Cyanocobalamin (B-12 PO) Take by mouth.    . Multiple Vitamins-Minerals (MULTIPLE VITAMINS/WOMENS PO) Take 1 tablet by mouth daily.    . Omega-3 Fatty Acids (FISH OIL) 1000 MG CAPS Take by mouth.    . oxybutynin (DITROPAN-XL) 5 MG 24 hr tablet Take 5 mg by mouth daily.    . vitamin k 100 MCG tablet Take 100 mcg by mouth daily.     No current facility-administered medications on file prior to visit.     No Known Allergies  Past Medical History:  Diagnosis Date  . Overactive bladder   . Urge incontinence of urine     Past Surgical History:  Procedure Laterality Date  . BREAST BIOPSY Left 2004   benign  . BUNIONECTOMY Bilateral    left ~2014, right 2018  . COLONOSCOPY    . ELBOW FRACTURE SURGERY Bilateral    right 2019, left ~2015  . ORIF ELBOW FRACTURE Right 12/01/2017   Procedure: OPEN REDUCTION INTERNAL FIXATION (ORIF) ELBOW/OLECRANON FRACTURE;  Surgeon: Kennedy BuckerMenz, Michael, MD;  Location: [facility] ORS;  Service: Orthopedics;  Laterality: Right;  . OTHER SURGICAL HISTORY  2015   left arm surgery with titanium plate    Family History  Problem Relation Age of Onset  . Stroke Mother   . Heart disease Mother   . Parkinson's disease Father   . Diabetes Neg Hx   . Cancer Neg Hx     Social History   Socioeconomic History  . Marital status: Married    Spouse name: Not on file  . Number of children: 1  .  Years of education: Not on file  . Highest education level: Not on file  Occupational History  . 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 Financial resource strain: Not on file  . Food insecurity:    Worry: Not on file    Inability: Not on file  . Transportation needs:    Medical: Not on file    Non-medical: Not on file  Tobacco Use  . Smoking status: Never Smoker  . Smokeless tobacco: Never Used  Substance and Sexual Activity  . Alcohol use: Yes    Comment: occassional  . Drug use: Never  . Sexual activity: Not on file  Lifestyle  . Physical activity:    Days per week: Not on file    Minutes per session: Not on file  . Stress: Not on file  Relationships  . Social connections:    Talks on phone: Not on file    Gets together: Not on file    Attends religious service: Not on file    Active member of club or organization: Not on file    Attends meetings of clubs or organizations: Not on file    Relationship status: Not on file  . Intimate partner  violence:    Fear of current or ex partner: Not on file    Emotionally abused: Not on file    Physically abused: Not on file    Forced sexual activity: Not on file  Other Topics Concern  . Not on file  Social History Narrative   Has living will   Husband is health care POA--then daughter   Would accept resuscitation but no prolonged ventilation or feeding tubes   Review of Systems  Constitutional: Negative for fatigue and unexpected weight change.       Plant based diet starting a year ago (husband's MI)--lost 20# then Wears seat belt  HENT: Negative for dental problem, hearing loss and tinnitus.        Sees dentist  Eyes: Negative for visual disturbance.       No diplopia or unilateral vision loss  Respiratory: Negative for cough, chest tightness and shortness of breath.   Cardiovascular: Negative for chest pain, palpitations and leg swelling.  Gastrointestinal: Negative  for abdominal pain, blood in stool and constipation.  Endocrine: Positive for polydipsia. Negative for polyuria.       Dry mouth from oxybutynin  Genitourinary: Positive for frequency. Negative for dyspareunia, dysuria and hematuria.  Musculoskeletal: Negative for back pain and joint swelling.  Skin: Negative for rash.       No suspicious lesions  Allergic/Immunologic: Negative for environmental allergies and immunocompromised state.  Neurological: Positive for dizziness. Negative for headaches.       Passed out a few months ago-- broke elbow. Related to vertigo?  Hematological: Negative for adenopathy. Does not bruise/bleed easily.  Psychiatric/Behavioral: Negative for dysphoric mood and sleep disturbance. The patient is not nervous/anxious.        Objective:   Physical Exam  Constitutional: She appears well-developed. No distress.  HENT:  Mouth/Throat: Oropharynx is clear and moist.  Neck: No thyromegaly present.  Cardiovascular: Normal rate, regular rhythm, normal heart sounds and intact distal pulses. Exam reveals no gallop.  No murmur heard. Respiratory: Breath sounds normal. No respiratory distress. She has no wheezes. She has no rales.  GI: Soft. There is no tenderness.  Musculoskeletal: She exhibits no edema or tenderness.  Lymphadenopathy:    She has no cervical adenopathy.  Skin: No rash noted. No erythema.  Psychiatric: She has a normal mood and affect. Her behavior is normal.           Assessment & Plan:

## 2018-02-22 NOTE — Assessment & Plan Note (Signed)
Some help from the oxybutynin Discussed potential side effects --will continue

## 2018-02-22 NOTE — Assessment & Plan Note (Signed)
See social history 

## 2018-02-22 NOTE — Assessment & Plan Note (Signed)
Had flu vaccine Recent mammogram Colon a few years ago--asked her to get records

## 2018-03-02 ENCOUNTER — Encounter: Payer: Self-pay | Admitting: Internal Medicine

## 2018-04-04 ENCOUNTER — Telehealth: Payer: Self-pay | Admitting: Internal Medicine

## 2018-04-04 NOTE — Telephone Encounter (Signed)
I contacted patient to f/u on her symptoms.   Patient has been waking up in ams with about worth of dizziness for approx 7-10days after receiving Shingrix vaccine.    She did not experience any dizziness today and it has been slowly improving over the past week. She denies any other symptoms.  No numbness, tingling, HA, chest pain, or SOB.  She felt maybe was related to the vaccine.  Shingrix can make you feel achy, flu-like and out of sorts for a few days which may or may not be related.  But do not see any reason why this should prevent her from receiving next dose.  She denies any allergic reaction type symptoms.   I have asked patient to start keeping a record of her blood pressures in the ams (especially if dizziness returns) and pms for the next week until her appointment with Dr. Alphonsus Sias.  She was also informed to increase her fluid intake as she admits to having trouble keeping self hydrated.    Patient is to call us back before her appointment next week if notes any concerning bp readings or if dizziness returns or worsens.   Will FYI to Dr. Alphonsus Sias in case he recommends anything further.  Patient is doing well otherwise today without complaints.

## 2018-04-04 NOTE — Telephone Encounter (Signed)
Pt called in and stated she was having dizziness for 9 days but today it eased up. Pt stated she had taken the shingles shot and it may have came from that but she has felt this way before the shot also. I called [facility] to get the pt triaged and per Tiffany the Nursing Manager the call didn't need to be triaged because it had been going on for 9 days. I was told by Shannon([facility]) to just sched an appt.

## 2018-04-05 NOTE — Telephone Encounter (Signed)
It is probably a reaction to the shingrix--though not typical.  I am not sure about the booster---but certainly this is not a clear contraindication to the booster dose. I would certainly recommend holding off till at least 6 months before giving it though

## 2018-04-07 NOTE — Telephone Encounter (Signed)
LM on VM for patient to call back to discuss details from Dr. Karle Starch response below.

## 2018-04-11 ENCOUNTER — Encounter: Payer: Self-pay | Admitting: Internal Medicine

## 2018-04-11 ENCOUNTER — Ambulatory Visit (INDEPENDENT_AMBULATORY_CARE_PROVIDER_SITE_OTHER): Payer: Medicare Other | Admitting: Internal Medicine

## 2018-04-11 VITALS — BP 124/78 | HR 68 | Temp 98.1°F | Ht 61.5 in | Wt 120.0 lb

## 2018-04-11 DIAGNOSIS — R42 Dizziness and giddiness: Secondary | ICD-10-CM

## 2018-04-11 HISTORY — DX: Dizziness and giddiness: R42

## 2018-04-11 MED ORDER — MECLIZINE HCL 25 MG PO TABS: 25.0000 mg | ORAL_TABLET | Freq: Three times a day (TID) | ORAL | 1 refills | Status: DC | PRN

## 2018-04-11 NOTE — Assessment & Plan Note (Signed)
Has clear vestibular vertigo Nothing to suggest intracranial lesion or stroke She relates to stress Unclear if the shingrix could have been a trigger Discussed salt Rx for meclizine for prn use

## 2018-04-11 NOTE — Progress Notes (Signed)
Subjective:    Patient ID: Julia LemmingLinda L Googe, female    DOB: [birth year], 73 y.o.   MRN: [id number]  HPI Here due to concerns about dizziness With husband  "Woke with vertigo" Remembers having it back when she was [age] (and maybe before) Does have seasickness This was after mother's death, divorce, moving, etc Now feels she is having stress again  Started a writing project Had deadline and then started moving---getting used to new area Has had 7 months of stress  Vertigo now started about 2 weeks ago Really noticed after the shingrix vaccine  Gets room spinning Has to sit on bed for a while before getting up Has to hold on May get it when turning in bed Mostly getting up out of bed in the AM---not clearly orthostatic Hasn't tried anything  They have been monitoring her BP 142/92---179/90 Usually much lower Pulse 55-78  Current Outpatient Medications on File Prior to Visit  Medication Sig Dispense Refill  . Ascorbic Acid (VITAMIN C) 100 MG tablet Take 100 mg by mouth daily.    . Cholecalciferol (VITAMIN D-1000 MAX ST) 25 MCG (1000 UT) tablet Take by mouth.    . Cyanocobalamin (B-12 PO) Take by mouth.    . Multiple Vitamins-Minerals (MULTIPLE VITAMINS/WOMENS PO) Take 1 tablet by mouth daily.    . Omega-3 Fatty Acids (FISH OIL) 1000 MG CAPS Take by mouth.    . oxybutynin (DITROPAN-XL) 5 MG 24 hr tablet Take 5 mg by mouth daily.    . vitamin k 100 MCG tablet Take 100 mcg by mouth daily.     No current facility-administered medications on file prior to visit.     No Known Allergies  Past Medical History:  Diagnosis Date  . Overactive bladder   . Urge incontinence of urine     Past Surgical History:  Procedure Laterality Date  . BREAST BIOPSY Left 2004   benign  . BUNIONECTOMY Bilateral    left ~2014, right 2018  . COLONOSCOPY    . ELBOW FRACTURE SURGERY Bilateral    right 2019, left ~2015  . ORIF ELBOW FRACTURE Right 12/01/2017   Procedure: OPEN REDUCTION  INTERNAL FIXATION (ORIF) ELBOW/OLECRANON FRACTURE;  Surgeon: Kennedy BuckerMenz, Michael, MD;  Location: [facility] ORS;  Service: Orthopedics;  Laterality: Right;  . OTHER SURGICAL HISTORY  2015   left arm surgery with titanium plate    Family History  Problem Relation Age of Onset  . Stroke Mother   . Heart disease Mother   . Parkinson's disease Father   . Diabetes Neg Hx   . Cancer Neg Hx     Social History   Socioeconomic History  . Marital status: Married    Spouse name: Not on file  . Number of children: 1  . Years of education: Not on file  . Highest education level: Not on file  Occupational History  . 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 Financial resource strain: Not on file  . Food insecurity:    Worry: Not on file    Inability: Not on file  . Transportation needs:    Medical: Not on file    Non-medical: Not on file  Tobacco Use  . Smoking status: Never Smoker  . Smokeless tobacco: Never Used  Substance and Sexual Activity  . Alcohol use: Yes    Comment: occassional  . Drug use: Never  . Sexual activity: Not on file  Lifestyle  .  Physical activity:    Days per week: Not on file    Minutes per session: Not on file  . Stress: Not on file  Relationships  . Social connections:    Talks on phone: Not on file    Gets together: Not on file    Attends religious service: Not on file    Active member of club or organization: Not on file    Attends meetings of clubs or organizations: Not on file    Relationship status: Not on file  . Intimate partner violence:    Fear of current or ex partner: Not on file    Emotionally abused: Not on file    Physically abused: Not on file    Forced sexual activity: Not on file  Other Topics Concern  . Not on file  Social History Narrative   Has living will   Husband is health care POA--then daughter   Would accept resuscitation but no prolonged ventilation or feeding tubes   Review  of Systems No tinnitus No hearing loss No recent travel (other than extended car trips) Has been eating a lot of popcorn with salt    Objective:   Physical Exam  Constitutional: She is oriented to person, place, and time.  HENT:  Mouth/Throat: Oropharynx is clear and moist. No oropharyngeal exudate.  Eyes: Pupils are equal, round, and reactive to light. EOM are normal.  No nystagmus  Neck: No thyromegaly present.  Cardiovascular: Normal rate, regular rhythm and normal heart sounds. Exam reveals no gallop.  No murmur heard. Respiratory: Effort normal and breath sounds normal. No respiratory distress. She has no wheezes. She has no rales.  Musculoskeletal:        General: No tenderness or edema.  Lymphadenopathy:    She has no cervical adenopathy.  Neurological: She is oriented to person, place, and time. She has normal strength. She displays no tremor. No cranial nerve deficit. She exhibits normal muscle tone. She displays a negative Romberg sign. Coordination and gait normal.           Assessment & Plan:

## 2018-05-05 ENCOUNTER — Telehealth: Payer: Self-pay | Admitting: Internal Medicine

## 2018-05-05 MED ORDER — OXYBUTYNIN CHLORIDE ER 5 MG PO TB24: 5.0000 mg | ORAL_TABLET | Freq: Every day | ORAL | 3 refills | Status: DC

## 2018-05-05 NOTE — Telephone Encounter (Signed)
Spoke to pt. Asked that the rx be sent to [facility]. Rx sent electronically.

## 2018-05-05 NOTE — Telephone Encounter (Signed)
Pt is requesting a refill on the oxybutynin. Pt is requesting a call back

## 2018-12-04 IMAGING — DX DG ELBOW 2V*R*
3 series · 3 of 3 positions shown · non-contrast
Comparison: None.

CLINICAL DATA: Right elbow pain after fall

EXAM:
RIGHT ELBOW - 2 VIEW

[elbow ap (1 of 2)]
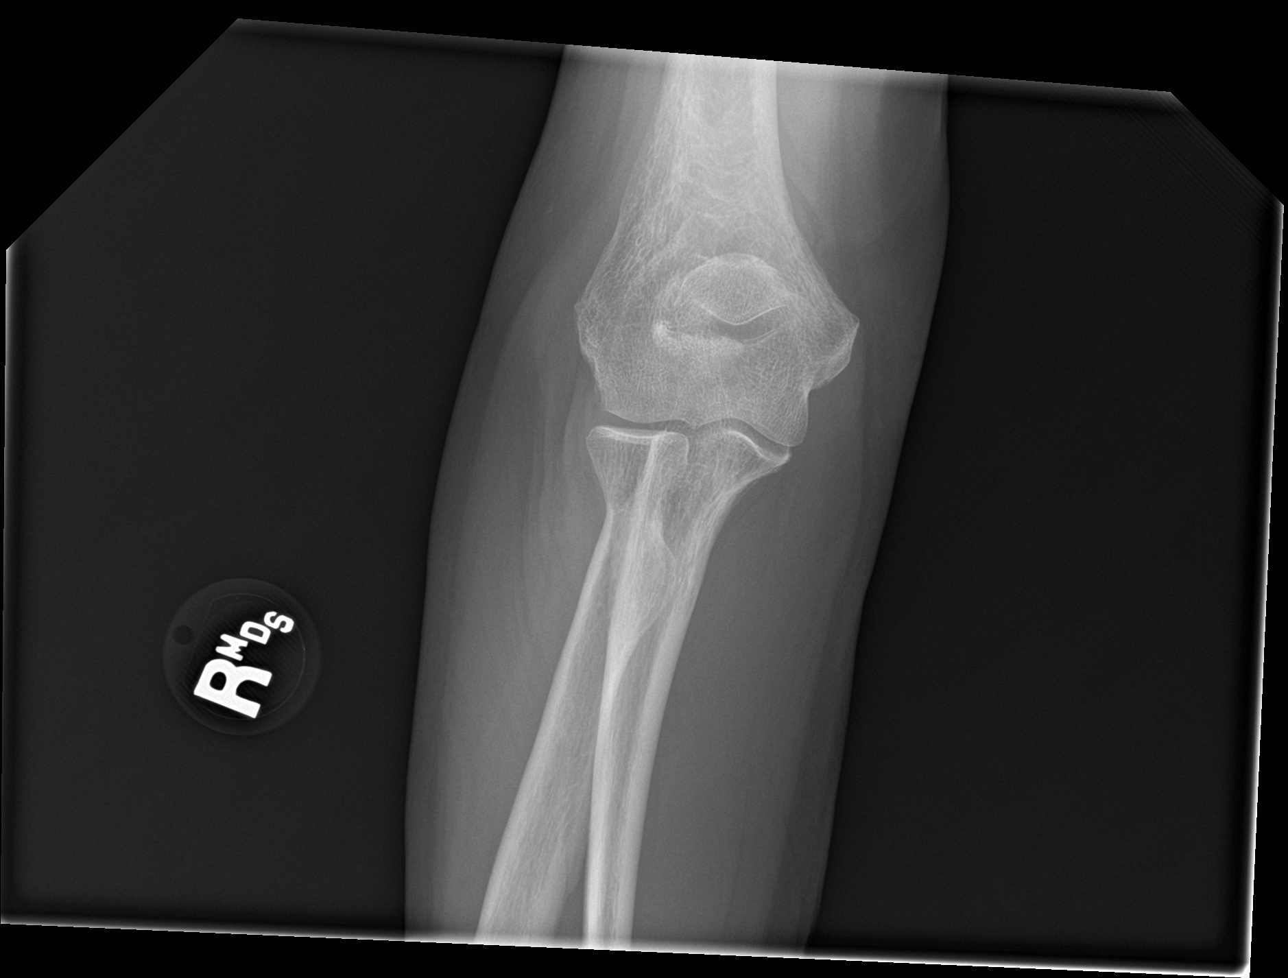

[elbow lat]
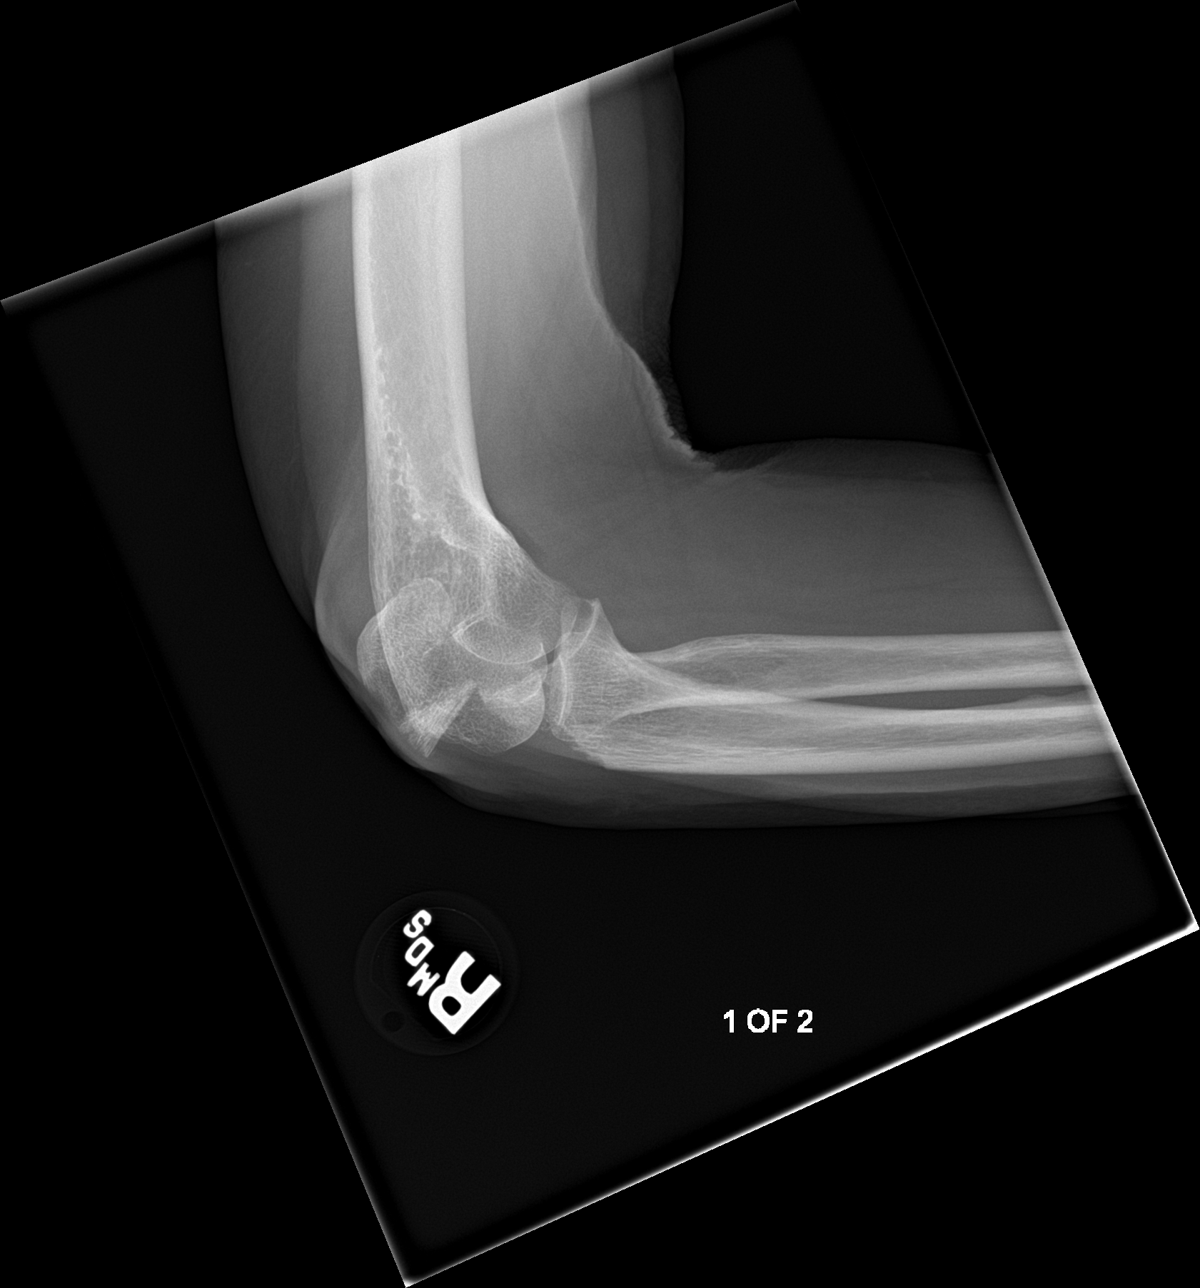

[elbow ap (2 of 2)]
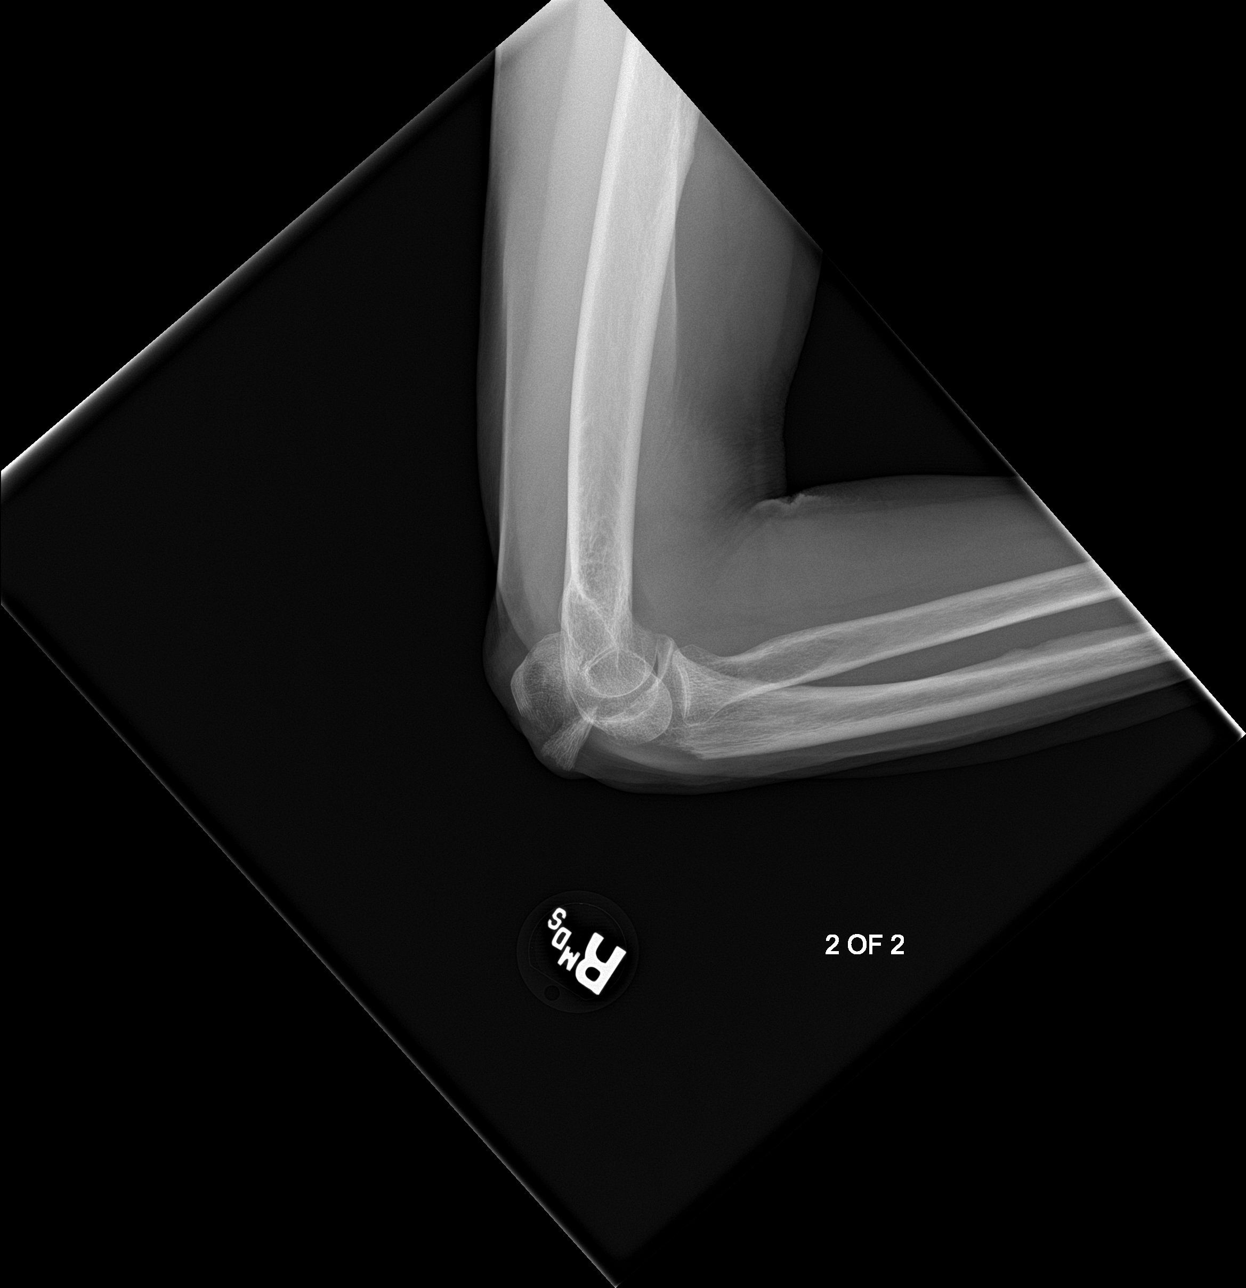

[3 of 3 positions shown; findings below may reference images not displayed]

FINDINGS: Acute, closed, comminuted fracture of the olecranon with 4 cm of
distraction of the main fracture fragments. A wedge shaped fracture
fragment measuring 18 x 18 x 9 mm is seen interposed within the gap
created by the fracture. The radius and distal humerus appear
intact.
IMPRESSION: Acute, comminuted fracture of the olecranon with 4 cm of distraction
noted. An 18 x 18 x 9 mm wedge-shaped fracture fragment is also seen
interposed between the main fracture fragments.

## 2018-12-06 IMAGING — DX DG ELBOW 2V*R*
3 series · 3 of 3 positions shown · non-contrast
Comparison: 11/29/2017

CLINICAL DATA: Status post olecranon ORIF

EXAM:
RIGHT ELBOW - 2 VIEW

[elbow ap (1 of 2)]
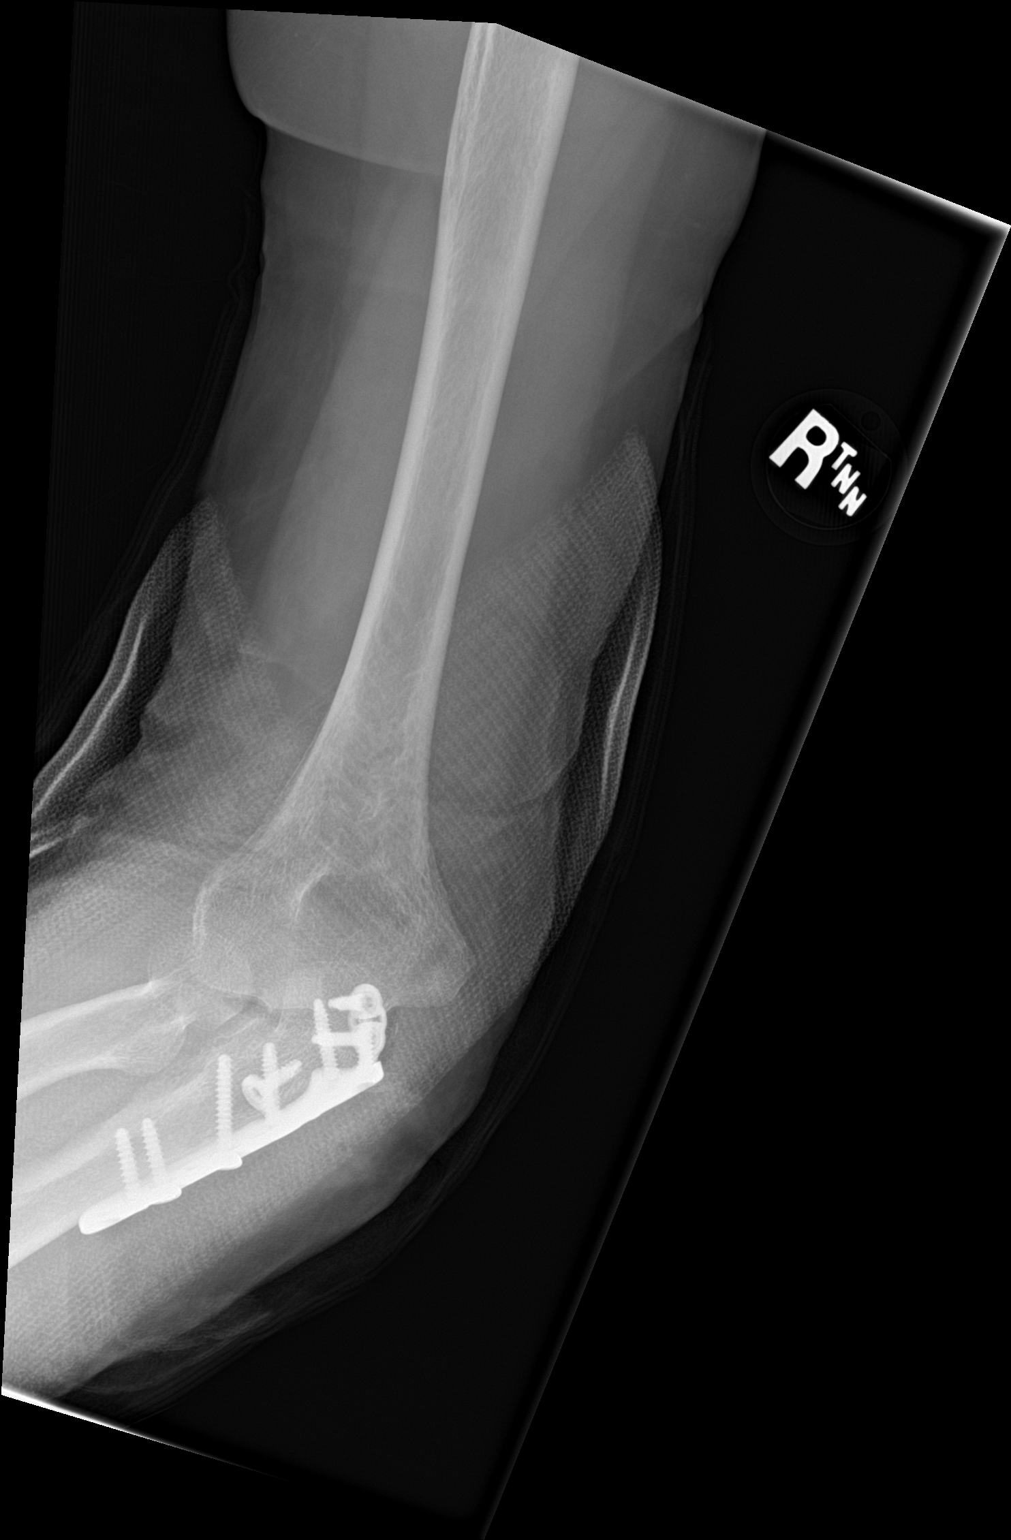

[elbow lat]
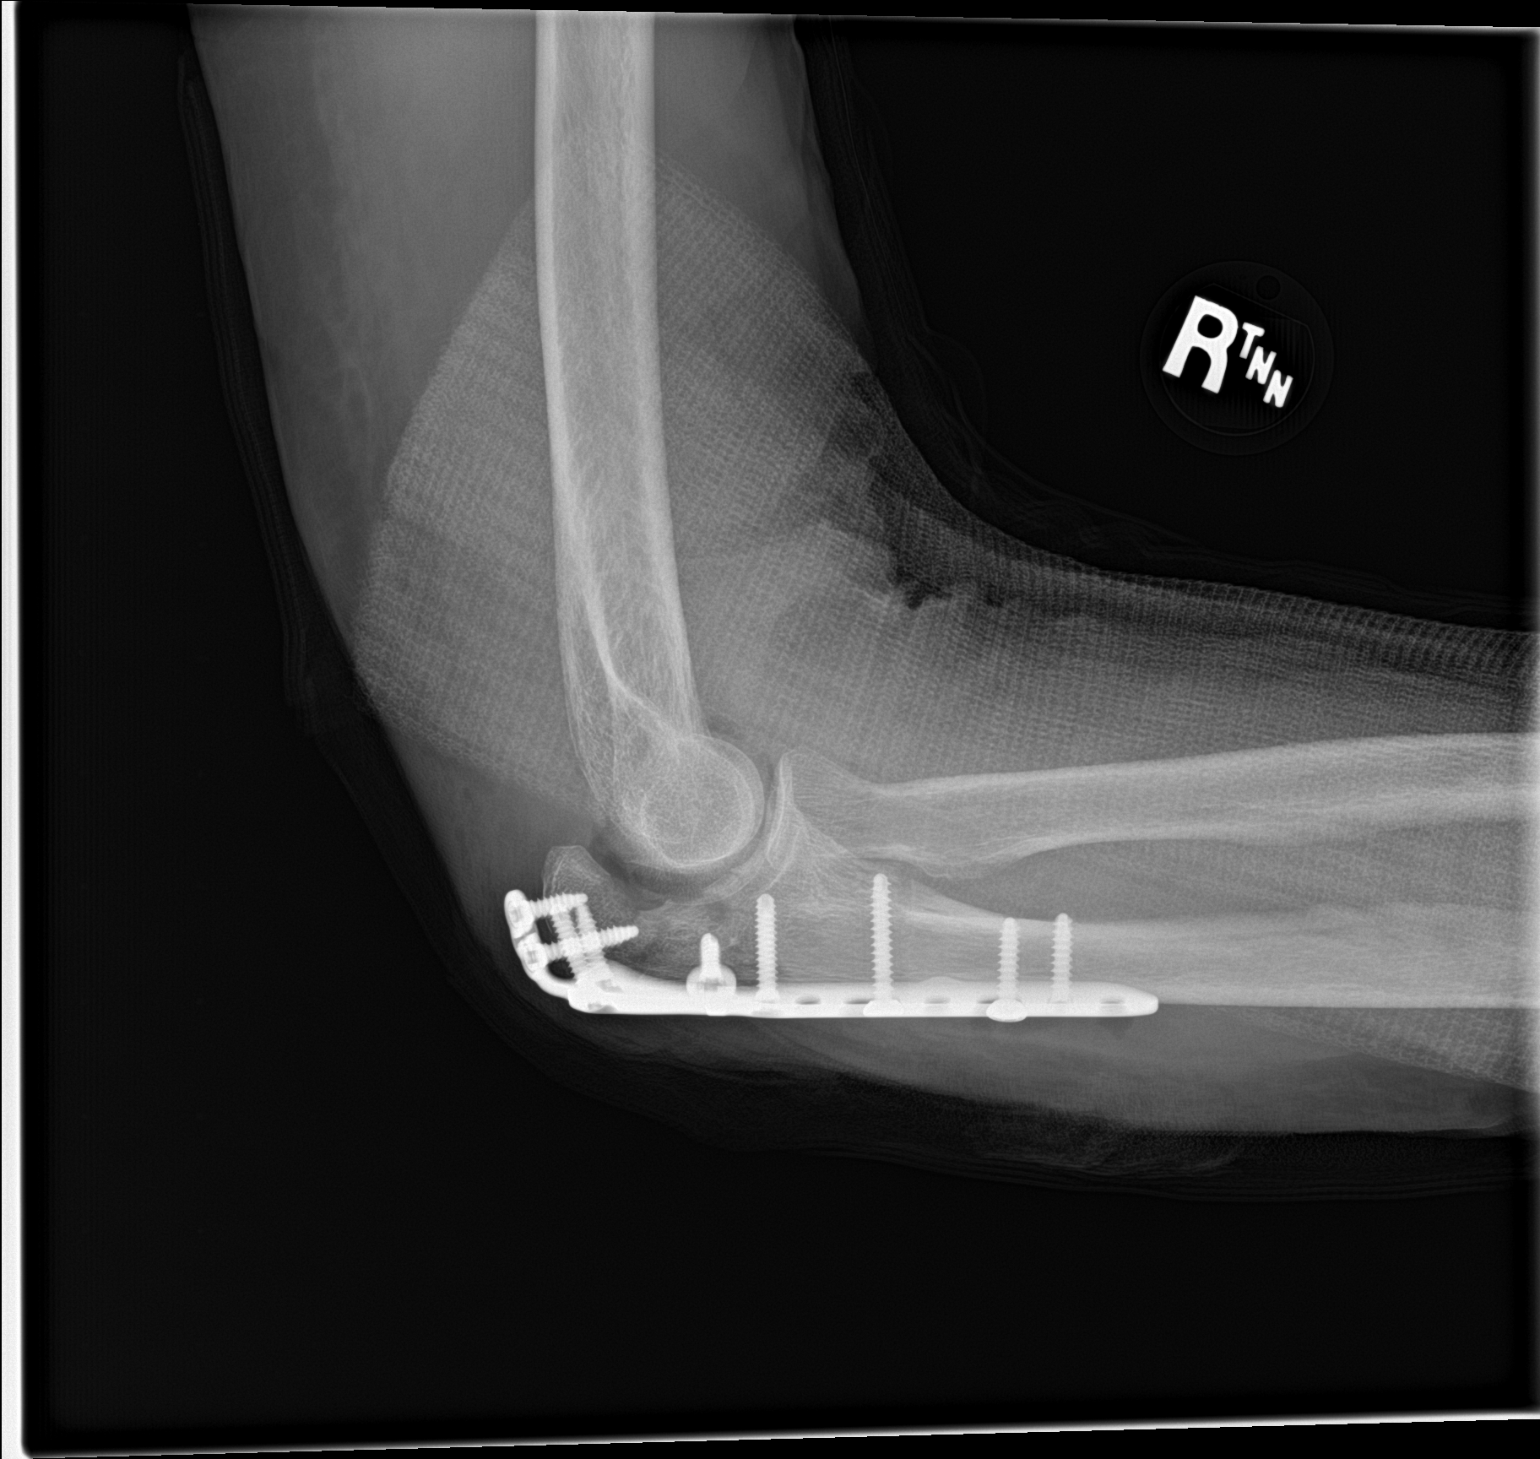

[elbow ap (2 of 2)]
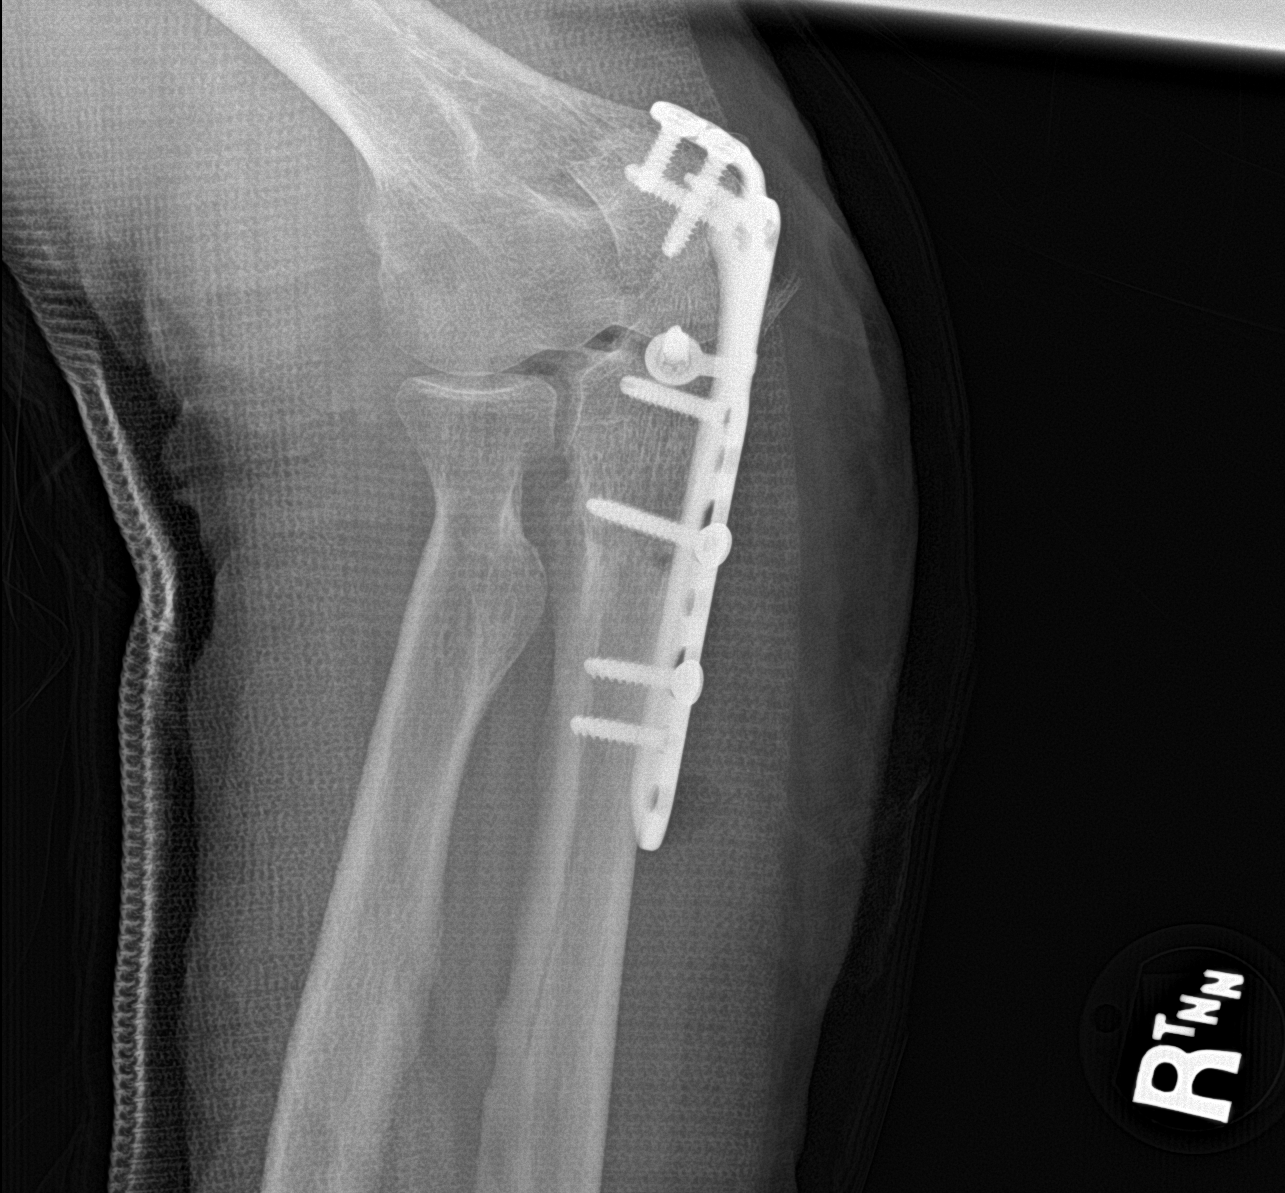

[3 of 3 positions shown; findings below may reference images not displayed]

FINDINGS: Interval surgical plate and multiple screw fixation of the proximal
ulna for olecranon fracture. Decreased separation of fracture
fragments as compared to preoperative radiographs. Gas in the soft
tissues and joint consistent with recent surgery.
IMPRESSION: Interval operative fixation of olecranon fracture with expected
postsurgical changes

## 2019-02-26 ENCOUNTER — Ambulatory Visit (INDEPENDENT_AMBULATORY_CARE_PROVIDER_SITE_OTHER): Payer: Medicare Other | Admitting: Internal Medicine

## 2019-02-26 ENCOUNTER — Other Ambulatory Visit: Payer: Self-pay

## 2019-02-26 ENCOUNTER — Encounter: Payer: Self-pay | Admitting: Internal Medicine

## 2019-02-26 VITALS — BP 138/88 | HR 64 | Temp 97.6°F | Ht 61.75 in | Wt 134.0 lb

## 2019-02-26 DIAGNOSIS — R42 Dizziness and giddiness: Secondary | ICD-10-CM

## 2019-02-26 DIAGNOSIS — F39 Unspecified mood [affective] disorder: Secondary | ICD-10-CM | POA: Insufficient documentation

## 2019-02-26 DIAGNOSIS — N3941 Urge incontinence: Secondary | ICD-10-CM | POA: Diagnosis not present

## 2019-02-26 DIAGNOSIS — Z0000 Encounter for general adult medical examination without abnormal findings: Secondary | ICD-10-CM

## 2019-02-26 DIAGNOSIS — Z7189 Other specified counseling: Secondary | ICD-10-CM

## 2019-02-26 DIAGNOSIS — Z1211 Encounter for screening for malignant neoplasm of colon: Secondary | ICD-10-CM

## 2019-02-26 LAB — COMPREHENSIVE METABOLIC PANEL
ALT: 18 U/L (ref 0–35)
AST: 23 U/L (ref 0–37)
Albumin: 4.4 g/dL (ref 3.5–5.2)
Alkaline Phosphatase: 139 U/L — ABNORMAL HIGH (ref 39–117)
BUN: 19 mg/dL (ref 6–23)
CO2: 31 mEq/L (ref 19–32)
Calcium: 9.7 mg/dL (ref 8.4–10.5)
Chloride: 101 mEq/L (ref 96–112)
Creatinine, Ser: 0.74 mg/dL (ref 0.40–1.20)
GFR: 76.81 mL/min (ref >60.00)
Glucose, Bld: 97 mg/dL (ref 70–99)
Potassium: 4.4 mEq/L (ref 3.5–5.1)
Sodium: 139 mEq/L (ref 135–145)
Total Bilirubin: 0.3 mg/dL (ref 0.2–1.2)
Total Protein: 7.2 g/dL (ref 6.0–8.3)

## 2019-02-26 LAB — CBC
HCT: 41.6 % (ref 36.0–46.0)
Hemoglobin: 14 g/dL (ref 12.0–15.0)
MCHC: 33.8 g/dL (ref 30.0–36.0)
MCV: 89.1 fl (ref 78.0–100.0)
Platelets: 306 10*3/uL (ref 150.0–400.0)
RBC: 4.66 Mil/uL (ref 3.87–5.11)
RDW: 14.2 % (ref 11.5–15.5)
WBC: 4.5 10*3/uL (ref 4.0–10.5)

## 2019-02-26 LAB — T4, FREE: Free T4: 0.86 ng/dL (ref 0.60–1.60)

## 2019-02-26 NOTE — Assessment & Plan Note (Signed)
Intermittent--mostly in AM upon first arising Meclizine does help Didn't seem to be involved in fall

## 2019-02-26 NOTE — Assessment & Plan Note (Signed)
See social history 

## 2019-02-26 NOTE — Assessment & Plan Note (Signed)
I have personally reviewed the Medicare Annual Wellness questionnaire and have noted 1. The patient's medical and social history 2. Their use of alcohol, tobacco or illicit drugs 3. Their current medications and supplements 4. The patient's functional ability including ADL's, fall risks, home safety risks and hearing or visual             impairment. 5. Diet and physical activities 6. Evidence for depression or mood disorders  The patients weight, height, BMI and visual acuity have been recorded in the chart I have made referrals, counseling and provided education to the patient based review of the above and I have provided the pt with a written personalized care plan for preventive services.  I have provided you with a copy of your personalized plan for preventive services. Please take the time to review along with your updated medication list.  Yearly flu vaccine Asked her to set up mammogram Colon would be due in March---she prefers to do FIT Asked her to do some resistance training

## 2019-02-26 NOTE — Progress Notes (Signed)
Subjective:    Patient ID: Julia Snyder, female    DOB: Aug 01, 1945, 73 y.o.   MRN: [id number]  HPI Here for Medicare wellness visit and follow up of chronic health conditions  This visit occurred during the SARS-CoV-2 public health emergency.  Safety protocols were in place, including screening questions prior to the visit, additional usage of staff PPE, and extensive cleaning of exam room while observing appropriate contact time as indicated for disinfecting solutions.   Reviewed form and advanced directives Reviewed other doctors Does walk regularly--but no resistance training---discussed One fall--with a minor injury Some mood issues Vision and hearing are fine Will have 1 scotch nightly No tobacco Independent with instrumental ADLs Some recall issues--loses stuff. 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 Anxious about finishing Intermittent depressed mood--husband inpatient and short with her at times. Feels that is related to COVID partially--but not predominantly Not really anhedonic  Still gets vertigo at times Uses the meclizine prn--but not often [place]y Seat again in the woods 3 weeks ago---tripped over something Hurt back--thinks she has bruised ribs  Still on oxybutynin Does help the bladder Takes 3 days then skips  Current Outpatient Medications on File Prior to Visit  Medication Sig Dispense Refill  . Ascorbic Acid (VITAMIN C) 100 MG tablet Take 100 mg by mouth daily.    . Cholecalciferol (VITAMIN D-1000 MAX ST) 25 MCG (1000 UT) tablet Take by mouth.    . Cyanocobalamin (B-12 PO) Take by mouth.    . meclizine (ANTIVERT) 25 MG tablet Take 1 tablet (25 mg total) by mouth 3 (three) times daily as needed for dizziness. 60 tablet 1  . Multiple Vitamins-Minerals (MULTIPLE VITAMINS/WOMENS PO) Take 1 tablet by mouth daily.    . Omega-3 Fatty Acids (FISH OIL) 1000 MG CAPS Take by mouth.    . oxybutynin  (DITROPAN-XL) 5 MG 24 hr tablet Take 1 tablet (5 mg total) by mouth daily. 90 tablet 3  . vitamin k 100 MCG tablet Take 100 mcg by mouth daily.     No current facility-administered medications on file prior to visit.    No Known Allergies  Past Medical History:  Diagnosis Date  . Overactive bladder   . Urge incontinence of urine     Past Surgical History:  Procedure Laterality Date  . BREAST BIOPSY Left 2004   benign  . BUNIONECTOMY Bilateral    left ~2014, right 2018  . COLONOSCOPY    . ELBOW FRACTURE SURGERY Bilateral    right 2019, left ~2015  . ORIF ELBOW FRACTURE Right 12/01/2017   Procedure: OPEN REDUCTION INTERNAL FIXATION (ORIF) ELBOW/OLECRANON FRACTURE;  Surgeon: Kennedy Bucker, MD;  Location: [facility] ORS;  Service: Orthopedics;  Laterality: Right;  . OTHER SURGICAL HISTORY  2015   left arm surgery with titanium plate    Family History  Problem Relation Age of Onset  . Stroke Mother   . Heart disease Mother   . Parkinson's disease Father   . Diabetes Neg Hx   . Cancer Neg Hx     Social History   Socioeconomic History  . Marital status: Married    Spouse name: Not on file  . Number of children: 1  . Years of education: Not on file  . Highest education level: Not on file  Occupational History  . Occupation: Glass blower/designer    Comment: Retired  Tobacco Use  . Smoking status: Never Smoker  .  Smokeless tobacco: Never Used  Substance and Sexual Activity  . Alcohol use: Yes    Comment: occassional  . Drug use: Never  . Sexual activity: Not on file  Other Topics Concern  . Not on file  Social History Narrative   Has living will   Husband is health care POA--then daughter   Would accept resuscitation but no prolonged ventilation or feeding tubes   Social Determinants of Health   Financial Resource Strain:   . Difficulty of Paying Living Expenses: Not on file  Food Insecurity:   . Worried About Charity fundraiser in the Last  Year: Not on file  . Ran Out of Food in the Last Year: Not on file  Transportation Needs:   . Lack of Transportation (Medical): Not on file  . Lack of Transportation (Non-Medical): Not on file  Physical Activity:   . Days of Exercise per Week: Not on file  . Minutes of Exercise per Session: Not on file  Stress:   . Feeling of Stress : Not on file  Social Connections:   . Frequency of Communication with Friends and Family: Not on file  . Frequency of Social Gatherings with Friends and Family: Not on file  . Attends Religious Services: Not on file  . Active Member of Clubs or Organizations: Not on file  . Attends Archivist Meetings: Not on file  . Marital Status: Not on file  Intimate Partner Violence:   . Fear of Current or Ex-Partner: Not on file  . Emotionally Abused: Not on file  . Physically Abused: Not on file  . Sexually Abused: Not on file    Review of Systems Appetite is fine--mostly plant based diet Weight up some--relates to COVID Sleeps fine Wears seat belt Teeth fine---keeps up with dentist No heartburn or dysphagia Bowels move fine--no blood No chest pain or SOB No dizziness or syncope (other than usually AM vertigo) No rash or suspicious skin lesions    Objective:   Physical Exam  Constitutional: She is oriented to person, place, and time. She appears well-developed. No distress.  HENT:  Mouth/Throat: Oropharynx is clear and moist. No oropharyngeal exudate.  Neck: No thyromegaly present.  Cardiovascular: Normal rate, regular rhythm, normal heart sounds and intact distal pulses. Exam reveals no gallop.  No murmur heard. Respiratory: Effort normal and breath sounds normal. No respiratory distress. She has no wheezes. She has no rales.  GI: Soft. There is no abdominal tenderness.  Musculoskeletal:        General: No tenderness or edema.  Lymphadenopathy:    She has no cervical adenopathy.  Neurological: She is alert and oriented to person, place,  and time.  President--- "Dwaine Deter, Bush" [phone] D-l-r-o-w Recall 1/3  Skin: No rash noted. No erythema.  Psychiatric: She has a normal mood and affect. Her behavior is normal.           Assessment & Plan:

## 2019-02-26 NOTE — Assessment & Plan Note (Signed)
Mild anxiety and dysthymia related to her 3 year project to try to finish screenplay No meds indicated Discussed reaching an end point

## 2019-02-26 NOTE — Assessment & Plan Note (Signed)
Continues on low dose oxybutynin Doesn't take every day No apparent side effects

## 2019-02-27 ENCOUNTER — Other Ambulatory Visit (INDEPENDENT_AMBULATORY_CARE_PROVIDER_SITE_OTHER): Payer: Medicare Other

## 2019-02-27 DIAGNOSIS — Z1211 Encounter for screening for malignant neoplasm of colon: Secondary | ICD-10-CM | POA: Diagnosis not present

## 2019-02-27 LAB — FECAL OCCULT BLOOD, IMMUNOCHEMICAL: Fecal Occult Bld: NEGATIVE

## 2019-03-30 DIAGNOSIS — Z23 Encounter for immunization: Secondary | ICD-10-CM | POA: Diagnosis not present

## 2019-04-27 DIAGNOSIS — Z23 Encounter for immunization: Secondary | ICD-10-CM | POA: Diagnosis not present

## 2019-08-17 ENCOUNTER — Other Ambulatory Visit: Payer: Self-pay | Admitting: Internal Medicine

## 2020-01-04 DIAGNOSIS — Z23 Encounter for immunization: Secondary | ICD-10-CM | POA: Diagnosis not present

## 2020-01-29 DIAGNOSIS — Z23 Encounter for immunization: Secondary | ICD-10-CM | POA: Diagnosis not present

## 2020-02-29 ENCOUNTER — Encounter: Payer: Self-pay | Admitting: Internal Medicine

## 2020-02-29 ENCOUNTER — Other Ambulatory Visit: Payer: Self-pay

## 2020-02-29 ENCOUNTER — Ambulatory Visit (INDEPENDENT_AMBULATORY_CARE_PROVIDER_SITE_OTHER): Payer: Medicare Other | Admitting: Internal Medicine

## 2020-02-29 VITALS — BP 114/72 | HR 62 | Temp 97.5°F | Ht 61.75 in | Wt 130.0 lb

## 2020-02-29 DIAGNOSIS — Z0000 Encounter for general adult medical examination without abnormal findings: Secondary | ICD-10-CM | POA: Diagnosis not present

## 2020-02-29 DIAGNOSIS — Z1211 Encounter for screening for malignant neoplasm of colon: Secondary | ICD-10-CM | POA: Diagnosis not present

## 2020-02-29 DIAGNOSIS — Z7189 Other specified counseling: Secondary | ICD-10-CM | POA: Diagnosis not present

## 2020-02-29 DIAGNOSIS — N3941 Urge incontinence: Secondary | ICD-10-CM | POA: Diagnosis not present

## 2020-02-29 DIAGNOSIS — R42 Dizziness and giddiness: Secondary | ICD-10-CM

## 2020-02-29 NOTE — Progress Notes (Signed)
Subjective:    Patient ID: Julia Snyder, female    DOB: 1945-11-21, 74 y.o.   MRN: [id number]  HPI Here for Medicare wellness visit and follow up of chronic health conditions This visit occurred during the SARS-CoV-2 public health emergency.  Safety protocols were in place, including screening questions prior to the visit, additional usage of staff PPE, and extensive cleaning of exam room while observing appropriate contact time as indicated for disinfecting solutions.   Reviewed form and advanced directives Reviewed other doctors Doing regular exercise Did stumble once while hiking--no sig injury No tobacco Still enjoys an occasional scotch No depressed mood now--and not anhedonic Vision is okay Hearing is good Independent with instrumental ADLs Some memory issues but no major issues (still plays bridge)  Has had a lot of stress Finally gave up on the screenplay Continues to walk and other exercise--this helps stress Likes bridge and other activities at [facility] on the oxybutynin  Gives her dry mouth--wonders about alternatives Has started skipping every 4th day or so  No recent vertigo  Current Outpatient Medications on File Prior to Visit  Medication Sig Dispense Refill  . Ascorbic Acid (VITAMIN C) 100 MG tablet Take 100 mg by mouth daily.    . Cholecalciferol 25 MCG (1000 UT) tablet Take by mouth.    . Cyanocobalamin (B-12 PO) Take by mouth.    . meclizine (ANTIVERT) 25 MG tablet Take 1 tablet (25 mg total) by mouth 3 (three) times daily as needed for dizziness. 60 tablet 1  . Multiple Vitamins-Minerals (MULTIPLE VITAMINS/WOMENS PO) Take 1 tablet by mouth daily.    . Omega-3 Fatty Acids (FISH OIL) 1000 MG CAPS Take by mouth.    . oxybutynin (DITROPAN-XL) 5 MG 24 hr tablet TAKE 1 TABLET(5 MG) BY MOUTH DAILY 90 tablet 1  . vitamin k 100 MCG tablet Take 100 mcg by mouth daily.     No current facility-administered medications on file prior to visit.     No Known Allergies  Past Medical History:  Diagnosis Date  . Overactive bladder   . Urge incontinence of urine     Past Surgical History:  Procedure Laterality Date  . BREAST BIOPSY Left 2004   benign  . BUNIONECTOMY Bilateral    left ~2014, right 2018  . COLONOSCOPY    . ELBOW FRACTURE SURGERY Bilateral    right 2019, left ~2015  . ORIF ELBOW FRACTURE Right 12/01/2017   Procedure: OPEN REDUCTION INTERNAL FIXATION (ORIF) ELBOW/OLECRANON FRACTURE;  Surgeon: Kennedy Bucker, MD;  Location: [facility] ORS;  Service: Orthopedics;  Laterality: Right;  . OTHER SURGICAL HISTORY  2015   left arm surgery with titanium plate    Family History  Problem Relation Age of Onset  . Stroke Mother   . Heart disease Mother   . Parkinson's disease Father   . Diabetes Neg Hx   . Cancer Neg Hx     Social History   Socioeconomic History  . Marital status: Married    Spouse name: Not on file  . Number of children: 1  . Years of education: Not on file  . Highest education level: Not on file  Occupational History  . Occupation: Glass blower/designer    Comment: Retired  Tobacco Use  . Smoking status: Never Smoker  . Smokeless tobacco: Never Used  Vaping Use  . Vaping Use: Never used  Substance and Sexual Activity  . Alcohol use: Yes    Comment:  occassional  . Drug use: Never  . Sexual activity: Not on file  Other Topics Concern  . Not on file  Social History Narrative   Has living will   Husband is health care POA--then daughter   Would accept resuscitation but no prolonged ventilation or feeding tubes   Social Determinants of Health   Financial Resource Strain: Not on file  Food Insecurity: Not on file  Transportation Needs: Not on file  Physical Activity: Not on file  Stress: Not on file  Social Connections: Not on file  Intimate Partner Violence: Not on file   Review of Systems Appetite is good Weight is up slightly Sleeps okay--some trouble  initiating Wears seat belt Teeth are okay---keeps up with dentist No suspicious skin lesions No chest pain or SOB No palpitations  No dizziness or syncope No heartburn or dysphagia Bowels are fine--no blood No sig back or joint pains     Objective:   Physical Exam Constitutional:      Appearance: Normal appearance.  HENT:     Mouth/Throat:     Comments: No lesions Eyes:     Conjunctiva/sclera: Conjunctivae normal.     Pupils: Pupils are equal, round, and reactive to light.  Cardiovascular:     Rate and Rhythm: Normal rate and regular rhythm.     Pulses: Normal pulses.     Heart sounds: No murmur heard. No gallop.   Pulmonary:     Effort: Pulmonary effort is normal.     Breath sounds: Normal breath sounds. No wheezing or rales.  Abdominal:     Palpations: Abdomen is soft.     Tenderness: There is no abdominal tenderness.  Musculoskeletal:     Right lower leg: No edema.     Left lower leg: No edema.  Skin:    General: Skin is warm.     Findings: No rash.  Neurological:     Mental Status: She is alert and oriented to person, place, and time.     Comments: President--- "Caren Macadam, Obama" [phone] D-l-r-o-w Recall 3/3  Psychiatric:        Mood and Affect: Mood normal.        Behavior: Behavior normal.            Assessment & Plan:

## 2020-02-29 NOTE — Assessment & Plan Note (Signed)
I have personally reviewed the Medicare Annual Wellness questionnaire and have noted 1. The patient's medical and social history 2. Their use of alcohol, tobacco or illicit drugs 3. Their current medications and supplements 4. The patient's functional ability including ADL's, fall risks, home safety risks and hearing or visual             impairment. 5. Diet and physical activities 6. Evidence for depression or mood disorders  The patients weight, height, BMI and visual acuity have been recorded in the chart I have made referrals, counseling and provided education to the patient based review of the above and I have provided the pt with a written personalized care plan for preventive services.  I have provided you with a copy of your personalized plan for preventive services. Please take the time to review along with your updated medication list.  Yearly flu vaccine Urged her to get at least one last mammogram FIT again---she will try to get her last colonoscopy report (thinks she may have been 70) Exercising regularly

## 2020-02-29 NOTE — Assessment & Plan Note (Signed)
Using the oxybutynin most days Will try to minimize this

## 2020-02-29 NOTE — Progress Notes (Signed)
Hearing Screening   Method: Audiometry   125Hz  250Hz  500Hz  1000Hz  2000Hz  3000Hz  4000Hz  6000Hz  8000Hz   Right ear:   20 20 20  20     Left ear:   20 20 20  20     Vision Screening Comments: Appt 03-03-20

## 2020-02-29 NOTE — Assessment & Plan Note (Signed)
No recent attacks

## 2020-02-29 NOTE — Assessment & Plan Note (Signed)
See social history 

## 2020-03-04 ENCOUNTER — Other Ambulatory Visit (INDEPENDENT_AMBULATORY_CARE_PROVIDER_SITE_OTHER): Payer: Medicare Other

## 2020-03-04 DIAGNOSIS — Z1211 Encounter for screening for malignant neoplasm of colon: Secondary | ICD-10-CM | POA: Diagnosis not present

## 2020-03-04 LAB — FECAL OCCULT BLOOD, IMMUNOCHEMICAL: Fecal Occult Bld: NEGATIVE

## 2020-03-27 ENCOUNTER — Other Ambulatory Visit: Payer: Self-pay | Admitting: Internal Medicine

## 2020-04-17 ENCOUNTER — Other Ambulatory Visit: Payer: Self-pay | Admitting: Internal Medicine

## 2020-07-31 DIAGNOSIS — Z23 Encounter for immunization: Secondary | ICD-10-CM | POA: Diagnosis not present

## 2020-10-17 ENCOUNTER — Telehealth: Payer: Self-pay | Admitting: *Deleted

## 2020-10-17 NOTE — Telephone Encounter (Signed)
Mr. Stief left voicemail on triage line stating he was calling for his wife Latice.  He states she has a severe rash.  They are currently with the nurse at [facility] but would like to schedule an office visit as soon as possible.  Appointment scheduled 10/20/2020 at 10:40 am with Dr. Selena Batten.

## 2020-10-18 NOTE — Telephone Encounter (Signed)
Okay noted

## 2020-10-20 ENCOUNTER — Ambulatory Visit (INDEPENDENT_AMBULATORY_CARE_PROVIDER_SITE_OTHER): Payer: Medicare Other | Admitting: Family Medicine

## 2020-10-20 ENCOUNTER — Other Ambulatory Visit: Payer: Self-pay

## 2020-10-20 ENCOUNTER — Encounter: Payer: Self-pay | Admitting: Family Medicine

## 2020-10-20 VITALS — BP 128/82 | HR 75 | Temp 97.6°F | Ht 61.75 in | Wt 128.0 lb

## 2020-10-20 DIAGNOSIS — R21 Rash and other nonspecific skin eruption: Secondary | ICD-10-CM | POA: Diagnosis not present

## 2020-10-20 MED ORDER — TRIAMCINOLONE ACETONIDE 0.5 % EX OINT: 1.0000 "application " | TOPICAL_OINTMENT | Freq: Two times a day (BID) | CUTANEOUS | 0 refills | Status: DC

## 2020-10-20 NOTE — Progress Notes (Signed)
   Subjective:     Julia Snyder is a 75 y.o. female presenting for Rash (Going on 2 weeks. )     Rash   #rash - x 2 weeks - treatment - hydrocortisone 2-3 times a day w/o improvement - did recently switch from jergens to dove lotion and thinks this did cause - switched back to jergens and vaseline w/o change - not itchy - no exposures - does walk, but no gardening - no painful - initially just smooth and red, but now more "scabbed" appearance   Review of Systems  Skin:  Positive for rash.    Social History   Tobacco Use  Smoking Status Never  Smokeless Tobacco Never        Objective:    BP Readings from Last 3 Encounters:  10/20/20 128/82  02/29/20 114/72  02/26/19 138/88   Wt Readings from Last 3 Encounters:  10/20/20 128 lb (58.1 kg)  02/29/20 130 lb (59 kg)  02/26/19 134 lb (60.8 kg)    BP 128/82   Pulse 75   Temp 97.6 F (36.4 C) (Temporal)   Ht 5' 1.75" (1.568 m)   Wt 128 lb (58.1 kg)   SpO2 95%   BMI 23.60 kg/m    Physical Exam Constitutional:      General: She is not in acute distress.    Appearance: She is well-developed. She is not diaphoretic.  HENT:     Right Ear: External ear normal.     Left Ear: External ear normal.  Eyes:     Conjunctiva/sclera: Conjunctivae normal.  Cardiovascular:     Rate and Rhythm: Normal rate.  Pulmonary:     Effort: Pulmonary effort is normal.  Musculoskeletal:     Cervical back: Neck supple.  Skin:    General: Skin is warm and dry.     Capillary Refill: Capillary refill takes less than 2 seconds.     Comments: Flexeril surface of the elbow with erythematous plaque with dry peeling top layer. Right with large triangle shaped area on the entire fossa. Left with linear lesion which is small.   Neurological:     Mental Status: She is alert. Mental status is at baseline.  Psychiatric:        Mood and Affect: Mood normal.        Behavior: Behavior normal.          Assessment & Plan:    Problem List Items Addressed This Visit       Musculoskeletal and Integument   Rash - Primary    Discussed location and appearance most consistent with dermatitis of some kind. Advised steroids. Discussed if not improving would consider fungal treatment as not itchy as expected with dermatitis. She will update if not improving.        Relevant Medications   triamcinolone ointment (KENALOG) 0.5 %     Return if symptoms worsen or fail to improve.  Lynnda Child, MD  This visit occurred during the SARS-CoV-2 public health emergency.  Safety protocols were in place, including screening questions prior to the visit, additional usage of staff PPE, and extensive cleaning of exam room while observing appropriate contact time as indicated for disinfecting solutions.

## 2020-10-20 NOTE — Patient Instructions (Signed)
Rash - triamcinolone for 2 weeks (may take up to 3-4 weeks if improving) - call if worsening or no change in 2 weeks -- will plan for fungal treatment   Derm if no change with either

## 2020-10-20 NOTE — Telephone Encounter (Signed)
See note from today

## 2020-10-20 NOTE — Assessment & Plan Note (Signed)
Discussed location and appearance most consistent with dermatitis of some kind. Advised steroids. Discussed if not improving would consider fungal treatment as not itchy as expected with dermatitis. She will update if not improving.

## 2020-12-04 DIAGNOSIS — Z23 Encounter for immunization: Secondary | ICD-10-CM | POA: Diagnosis not present

## 2020-12-21 DIAGNOSIS — Z23 Encounter for immunization: Secondary | ICD-10-CM | POA: Diagnosis not present

## 2020-12-31 DIAGNOSIS — M25562 Pain in left knee: Secondary | ICD-10-CM | POA: Diagnosis not present

## 2021-02-23 NOTE — Progress Notes (Deleted)
Subjective:   Julia Snyder is a 75 y.o. female who presents for Medicare Annual (Subsequent) preventive examination.  I connected with Dillard Cannon today by telephone and verified that I am speaking with the correct person using two identifiers. Location patient: home Location provider: work Persons participating in the virtual visit: patient, Engineer, civil (consulting).    I discussed the limitations, risks, security and privacy concerns of performing an evaluation and management service by telephone and the availability of in person appointments. I also discussed with the patient that there may be a patient responsible charge related to this service. The patient expressed understanding and verbally consented to this telephonic visit.    Interactive audio and video telecommunications were attempted between this provider and patient, however failed, due to patient having technical difficulties OR patient did not have access to video capability.  We continued and completed visit with audio only.  Some vital signs may be absent or patient reported.   Time Spent with patient on telephone encounter: *** minutes  Review of Systems           Objective:    There were no vitals filed for this visit. There is no height or weight on file to calculate BMI.  Advanced Directives 12/01/2017 11/29/2017  Does Patient Have a Medical Advance Directive? Yes No  Type of Estate agent of [place];Living will -  Copy of Healthcare Power of Attorney in Chart? No - copy requested -  Would patient like information on creating a medical advance directive? - No - Patient declined    Current Medications (verified) Outpatient Encounter Medications as of 02/25/2021  Medication Sig   Ascorbic Acid (VITAMIN C) 100 MG tablet Take 100 mg by mouth daily.   Cholecalciferol 25 MCG (1000 UT) tablet Take by mouth.   Cyanocobalamin (B-12 PO) Take by mouth.   meclizine (ANTIVERT) 25 MG tablet Take 1 tablet (25  mg total) by mouth 3 (three) times daily as needed for dizziness.   Multiple Vitamins-Minerals (MULTIPLE VITAMINS/WOMENS PO) Take 1 tablet by mouth daily.   Omega-3 Fatty Acids (FISH OIL) 1000 MG CAPS Take by mouth.   oxybutynin (DITROPAN-XL) 5 MG 24 hr tablet TAKE 1 TABLET(5 MG) BY MOUTH DAILY   triamcinolone ointment (KENALOG) 0.5 % Apply 1 application topically 2 (two) times daily.   vitamin k 100 MCG tablet Take 100 mcg by mouth daily.   No facility-administered encounter medications on file as of 02/25/2021.    Allergies (verified) Patient has no known allergies.   History: Past Medical History:  Diagnosis Date   Overactive bladder    Urge incontinence of urine    Past Surgical History:  Procedure Laterality Date   BREAST BIOPSY Left 2004   benign   BUNIONECTOMY Bilateral    left ~2014, right 2018   COLONOSCOPY     ELBOW FRACTURE SURGERY Bilateral    right 2019, left ~2015   ORIF ELBOW FRACTURE Right 12/01/2017   Procedure: OPEN REDUCTION INTERNAL FIXATION (ORIF) ELBOW/OLECRANON FRACTURE;  Surgeon: Kennedy Bucker, MD;  Location: [facility] ORS;  Service: Orthopedics;  Laterality: Right;   OTHER SURGICAL HISTORY  2015   left arm surgery with titanium plate   Family History  Problem Relation Age of Onset   Stroke Mother    Heart disease Mother    Parkinson's disease Father    Diabetes Neg Hx    Cancer Neg Hx    Social History   Socioeconomic History   Marital status: Married  Spouse name: Not on file   Number of children: 1   Years of education: Not on file   Highest education level: Not on file  Occupational History   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  Tobacco Use   Smoking status: Never   Smokeless tobacco: Never  Vaping Use   Vaping Use: Never used  Substance and Sexual Activity   Alcohol use: Yes    Comment: occassional   Drug use: Never   Sexual activity: Not on file  Other Topics Concern   Not on file  Social  History Narrative   Has living will   Husband is health care POA--then daughter   Would accept resuscitation but no prolonged ventilation or feeding tubes   Social Determinants of Health   Financial Resource Strain: Not on file  Food Insecurity: Not on file  Transportation Needs: Not on file  Physical Activity: Not on file  Stress: Not on file  Social Connections: Not on file    Tobacco Counseling Counseling given: Not Answered   Clinical Intake:                 Diabetic? No         Activities of Daily Living In your present state of health, do you have any difficulty performing the following activities: 10/20/2020  Hearing? N  Vision? Y  Difficulty concentrating or making decisions? Y  Walking or climbing stairs? N  Dressing or bathing? N  Doing errands, shopping? N  Some recent data might be hidden    Patient Care Team: Karie Schwalbe, MD as PCP - General (Internal Medicine)  Indicate any recent Medical Services you may have received from other than [facility] providers in the past year (date may be approximate).     Assessment:   This is a routine wellness examination for [place].  Hearing/Vision screen No results found.  Dietary issues and exercise activities discussed:     Goals Addressed   None    Depression Screen PHQ 2/9 Scores 02/29/2020 02/26/2019 02/22/2018  PHQ - 2 Score 0 1 0    Fall Risk Fall Risk  02/29/2020 02/26/2019 02/22/2018  Falls in the past year? 0 1 1  Number falls in past yr: - 0 0  Injury with Fall? - 1 -  Follow up - Falls prevention discussed Falls prevention discussed    FALL RISK PREVENTION PERTAINING TO THE HOME:  Any stairs in or around the home? {YES/NO:21197} If so, are there any without handrails? {YES/NO:21197} Home free of loose throw rugs in walkways, pet beds, electrical cords, etc? {YES/NO:21197} Adequate lighting in your home to reduce risk of falls? {YES/NO:21197}  ASSISTIVE DEVICES UTILIZED TO  PREVENT FALLS:  Life alert? {YES/NO:21197} Use of a cane, walker or w/c? {YES/NO:21197} Grab bars in the bathroom? {YES/NO:21197} Shower chair or bench in shower? {YES/NO:21197} Elevated toilet seat or a handicapped toilet? {YES/NO:21197}  TIMED UP AND GO:  Was the test performed? No , visit completed over the phone    Cognitive Function:        Immunizations Immunization History  Administered Date(s) Administered   Hep A / Hep B 09/13/2011, 09/20/2011, 10/04/2011, 09/25/2012   Influenza-Unspecified 02/01/2018, 12/28/2018, 01/14/2020   Moderna Sars-Covid-2 Vaccination 03/30/2019, 04/27/2019, 01/29/2020   Pneumococcal Conjugate-13 02/11/2014   Pneumococcal Polysaccharide-23 02/24/2011   Tdap 11/29/2017   Yellow Fever 09/13/2011   Zoster Recombinat (Shingrix) 03/26/2018, 06/26/2018   Zoster, Live 03/15/2006    TDAP  status: Up to date  {Flu Vaccine status:2101806}  Pneumococcal vaccine status: Up to date  {Covid-19 vaccine status:2101808}  Qualifies for Shingles Vaccine? Yes   Zostavax completed Yes   Shingrix Completed?: Yes  Screening Tests Health Maintenance  Topic Date Due   Hepatitis C Screening  Never done   COLONOSCOPY (Pts 45-24yrs Insurance coverage will need to be confirmed)  05/31/2019   COVID-19 Vaccine (4 - Booster for Moderna series) 03/25/2020   INFLUENZA VACCINE  10/13/2020   TETANUS/TDAP  11/30/2027   Pneumonia Vaccine 59+ Years old  Completed   DEXA SCAN  Completed   Zoster Vaccines- Shingrix  Completed   HPV VACCINES  Aged Out    Health Maintenance  Health Maintenance Due  Topic Date Due   Hepatitis C Screening  Never done   COLONOSCOPY (Pts 45-81yrs Insurance coverage will need to be confirmed)  05/31/2019   COVID-19 Vaccine (4 - Booster for Moderna series) 03/25/2020   INFLUENZA VACCINE  10/13/2020    {Colorectal cancer screening:2101809}  {Mammogram status:21018020}  {Bone Density status:21018021}  Lung Cancer Screening:  (Low Dose CT Chest recommended if Age 77-80 years, 30 pack-year currently smoking OR have quit w/in 15years.) does not qualify.     Additional Screening:  Hepatitis C Screening: does qualify;    Vision Screening: Recommended annual ophthalmology exams for early detection of glaucoma and other disorders of the eye. Is the patient up to date with their annual eye exam?  {YES/NO:21197} Who is the provider or what is the name of the office in which the patient attends annual eye exams? *** If pt is not established with a provider, would they like to be referred to a provider to establish care? {YES/NO:21197}.   Dental Screening: Recommended annual dental exams for proper oral hygiene  Community Resource Referral / Chronic Care Management: CRR required this visit?  {YES/NO:21197}  CCM required this visit?  {YES/NO:21197}     Plan:     I have personally reviewed and noted the following in the patient's chart:   Medical and social history Use of alcohol, tobacco or illicit drugs  Current medications and supplements including opioid prescriptions.  Functional ability and status Nutritional status Physical activity Advanced directives List of other physicians Hospitalizations, surgeries, and ER visits in previous 12 months Vitals Screenings to include cognitive, depression, and falls Referrals and appointments  In addition, I have reviewed and discussed with patient certain preventive protocols, quality metrics, and best practice recommendations. A written personalized care plan for preventive services as well as general preventive health recommendations were provided to patient.   Due to this being a telephonic visit, the after visit summary with patients personalized plan was offered to patient via mail or my-chart. ***Patient declined at this time./ Patient would like to access on my-chart/ per request, patient was mailed a copy of AVS./ Patient preferred to pick up at office at  next visit.   Janne Lab, LPN   96/[id number]/[id number]   Nurse Health Advisor  Nurse Notes: none

## 2021-02-25 ENCOUNTER — Telehealth: Payer: Self-pay

## 2021-02-25 ENCOUNTER — Ambulatory Visit: Payer: Medicare Other

## 2021-02-25 NOTE — Telephone Encounter (Signed)
Made several attempts to reach patient in regards to annual wellness visit today @ 11:15am. Left VM advising patient of appointment and to call office to reschedule when available. TM

## 2021-03-02 ENCOUNTER — Ambulatory Visit (INDEPENDENT_AMBULATORY_CARE_PROVIDER_SITE_OTHER): Payer: Medicare Other | Admitting: Internal Medicine

## 2021-03-02 ENCOUNTER — Other Ambulatory Visit: Payer: Self-pay

## 2021-03-02 ENCOUNTER — Encounter: Payer: Self-pay | Admitting: Internal Medicine

## 2021-03-02 VITALS — BP 132/80 | HR 90 | Temp 97.4°F | Ht 61.25 in | Wt 130.0 lb

## 2021-03-02 DIAGNOSIS — N3941 Urge incontinence: Secondary | ICD-10-CM | POA: Diagnosis not present

## 2021-03-02 DIAGNOSIS — Z0000 Encounter for general adult medical examination without abnormal findings: Secondary | ICD-10-CM

## 2021-03-02 DIAGNOSIS — R42 Dizziness and giddiness: Secondary | ICD-10-CM | POA: Diagnosis not present

## 2021-03-02 DIAGNOSIS — Z7189 Other specified counseling: Secondary | ICD-10-CM | POA: Diagnosis not present

## 2021-03-02 DIAGNOSIS — Z1211 Encounter for screening for malignant neoplasm of colon: Secondary | ICD-10-CM | POA: Diagnosis not present

## 2021-03-02 NOTE — Progress Notes (Signed)
Subjective:    Patient ID: Julia Snyder, female    DOB: 27-Mar-1945, 75 y.o.   MRN: [id number]  HPI Here for Medicare wellness visit and follow up of chronic health conditions With husband Reviewed advanced directives Reviewed other doctors--Dr Richardson--eye doctor, Dr Maisie Fus, [facility] No hospitalizations or surgery in the past year No tobacco Occasional alcohol Vision is okay--has check up next week Hearing is fine Still exercises regularly Had 1 fall and tore tendon in leg--had trouble walking for a while (happened in [place] then came back and saw [facility]) No depression---just moody at times. No anhedonia Independent with instrumental ADLs Mild memory issues----nothing worrisome  Rash did finally go away No other skin problems other than dry skin  Has had trouble with urinary leakage Oxybutynin seems to help this Does stay thirsty---no constipation  No recent vertigo Still keeps the antivert--just in case  Current Outpatient Medications on File Prior to Visit  Medication Sig Dispense Refill   Ascorbic Acid (VITAMIN C) 100 MG tablet Take 100 mg by mouth daily.     Cholecalciferol 25 MCG (1000 UT) tablet Take by mouth.     Cyanocobalamin (B-12 PO) Take by mouth.     meclizine (ANTIVERT) 25 MG tablet Take 1 tablet (25 mg total) by mouth 3 (three) times daily as needed for dizziness. 60 tablet 1   Multiple Vitamins-Minerals (MULTIPLE VITAMINS/WOMENS PO) Take 1 tablet by mouth daily.     Omega-3 Fatty Acids (FISH OIL) 1000 MG CAPS Take by mouth.     oxybutynin (DITROPAN-XL) 5 MG 24 hr tablet TAKE 1 TABLET(5 MG) BY MOUTH DAILY 90 tablet 3   vitamin k 100 MCG tablet Take 100 mcg by mouth daily.     No current facility-administered medications on file prior to visit.    No Known Allergies  Past Medical History:  Diagnosis Date   Overactive bladder    Urge incontinence of urine     Past Surgical History:  Procedure Laterality Date   BREAST  BIOPSY Left 2004   benign   BUNIONECTOMY Bilateral    left ~2014, right 2018   COLONOSCOPY     ELBOW FRACTURE SURGERY Bilateral    right 2019, left ~2015   ORIF ELBOW FRACTURE Right 12/01/2017   Procedure: OPEN REDUCTION INTERNAL FIXATION (ORIF) ELBOW/OLECRANON FRACTURE;  Surgeon: Kennedy Bucker, MD;  Location: [facility] ORS;  Service: Orthopedics;  Laterality: Right;   OTHER SURGICAL HISTORY  2015   left arm surgery with titanium plate    Family History  Problem Relation Age of Onset   Stroke Mother    Heart disease Mother    Parkinson's disease Father    Diabetes Neg Hx    Cancer Neg Hx     Social History   Socioeconomic History   Marital status: Married    Spouse name: Not on file   Number of children: 1   Years of education: Not on file   Highest education level: Not on file  Occupational History   Occupation: Psychologist, 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  Tobacco Use   Smoking status: Never   Smokeless tobacco: Never  Vaping Use   Vaping Use: Never used  Substance and Sexual Activity   Alcohol use: Yes    Comment: occassional   Drug use: Never   Sexual activity: Not on file  Other Topics Concern   Not on file  Social History Narrative   Has living will   Husband  is health care POA--then daughter   Requests DNR--done 03/02/21   Not sure about feeding tube   Social Determinants of Health   Financial Resource Strain: Not on file  Food Insecurity: Not on file  Transportation Needs: Not on file  Physical Activity: Not on file  Stress: Not on file  Social Connections: Not on file  Intimate Partner Violence: Not on file   Review of Systems Appetite is good Weight is stable Sleeps well Wears seat belt Teeth okay---keeps up with dentist No heartburn or dysphagia Bowels are fine--no blood No back or joint pains No chest pain or SOB No dizziness or syncope    Objective:   Physical Exam Constitutional:      Appearance: Normal appearance.   HENT:     Mouth/Throat:     Comments: No lesions Eyes:     Conjunctiva/sclera: Conjunctivae normal.     Pupils: Pupils are equal, round, and reactive to light.  Cardiovascular:     Rate and Rhythm: Normal rate and regular rhythm.     Pulses: Normal pulses.     Heart sounds: No murmur heard.   No gallop.  Pulmonary:     Breath sounds: Normal breath sounds. No wheezing or rales.  Abdominal:     Palpations: Abdomen is soft.     Tenderness: There is no abdominal tenderness.  Musculoskeletal:     Cervical back: Neck supple.     Right lower leg: No edema.     Left lower leg: No edema.  Lymphadenopathy:     Cervical: No cervical adenopathy.  Skin:    Findings: No lesion or rash.  Neurological:     Mental Status: She is alert and oriented to person, place, and time.     Comments: President---" Liliane Channel, Obama" [id number]-a-[id number] Recall 3/3  Psychiatric:        Mood and Affect: Mood normal.        Behavior: Behavior normal.           Assessment & Plan:

## 2021-03-02 NOTE — Assessment & Plan Note (Signed)
Feels the oxybutynin really helps Discussed possible side effects

## 2021-03-02 NOTE — Assessment & Plan Note (Signed)
See social history DNR written

## 2021-03-02 NOTE — Assessment & Plan Note (Signed)
I have personally reviewed the Medicare Annual Wellness questionnaire and have noted 1. The patient's medical and social history 2. Their use of alcohol, tobacco or illicit drugs 3. Their current medications and supplements 4. The patient's functional ability including ADL's, fall risks, home safety risks and hearing or visual             impairment. 5. Diet and physical activities 6. Evidence for depression or mood disorders  The patients weight, height, BMI and visual acuity have been recorded in the chart I have made referrals, counseling and provided education to the patient based review of the above and I have provided the pt with a written personalized care plan for preventive services.  I have provided you with a copy of your personalized plan for preventive services. Please take the time to review along with your updated medication list.  Healthy Exercises regularly Will do FIT She prefers no mammogram--discussed Had bivalent COVID and flu vaccines

## 2021-03-02 NOTE — Assessment & Plan Note (Signed)
Rare now Has the meclizine for prn

## 2021-03-03 LAB — COMPREHENSIVE METABOLIC PANEL
ALT: 22 U/L (ref 0–35)
AST: 25 U/L (ref 0–37)
Albumin: 4.4 g/dL (ref 3.5–5.2)
Alkaline Phosphatase: 86 U/L (ref 39–117)
BUN: 20 mg/dL (ref 6–23)
CO2: 31 mEq/L (ref 19–32)
Calcium: 9.7 mg/dL (ref 8.4–10.5)
Chloride: 100 mEq/L (ref 96–112)
Creatinine, Ser: 0.66 mg/dL (ref 0.40–1.20)
GFR: 85.73 mL/min (ref >60.00)
Glucose, Bld: 99 mg/dL (ref 70–99)
Potassium: 4 mEq/L (ref 3.5–5.1)
Sodium: 138 mEq/L (ref 135–145)
Total Bilirubin: 0.3 mg/dL (ref 0.2–1.2)
Total Protein: 6.8 g/dL (ref 6.0–8.3)

## 2021-03-03 LAB — CBC
HCT: 40.3 % (ref 36.0–46.0)
Hemoglobin: 13.4 g/dL (ref 12.0–15.0)
MCHC: 33.3 g/dL (ref 30.0–36.0)
MCV: 88.1 fl (ref 78.0–100.0)
Platelets: 329 10*3/uL (ref 150.0–400.0)
RBC: 4.57 Mil/uL (ref 3.87–5.11)
RDW: 14.1 % (ref 11.5–15.5)
WBC: 4.5 10*3/uL (ref 4.0–10.5)

## 2021-03-05 ENCOUNTER — Other Ambulatory Visit (INDEPENDENT_AMBULATORY_CARE_PROVIDER_SITE_OTHER): Payer: Medicare Other

## 2021-03-05 DIAGNOSIS — Z1211 Encounter for screening for malignant neoplasm of colon: Secondary | ICD-10-CM | POA: Diagnosis not present

## 2021-03-05 LAB — FECAL OCCULT BLOOD, IMMUNOCHEMICAL: Fecal Occult Bld: NEGATIVE

## 2021-04-03 ENCOUNTER — Other Ambulatory Visit: Payer: Self-pay | Admitting: Internal Medicine

## 2021-05-06 DIAGNOSIS — S76312A Strain of muscle, fascia and tendon of the posterior muscle group at thigh level, left thigh, initial encounter: Secondary | ICD-10-CM | POA: Diagnosis not present

## 2021-05-06 DIAGNOSIS — M25562 Pain in left knee: Secondary | ICD-10-CM | POA: Diagnosis not present

## 2021-05-27 DIAGNOSIS — M25562 Pain in left knee: Secondary | ICD-10-CM | POA: Diagnosis not present

## 2021-05-28 DIAGNOSIS — M25562 Pain in left knee: Secondary | ICD-10-CM | POA: Diagnosis not present

## 2021-05-28 DIAGNOSIS — M76899 Other specified enthesopathies of unspecified lower limb, excluding foot: Secondary | ICD-10-CM | POA: Diagnosis not present

## 2021-06-01 DIAGNOSIS — M25562 Pain in left knee: Secondary | ICD-10-CM | POA: Diagnosis not present

## 2021-06-04 DIAGNOSIS — M25562 Pain in left knee: Secondary | ICD-10-CM | POA: Diagnosis not present

## 2021-06-09 DIAGNOSIS — S82102A Unspecified fracture of upper end of left tibia, initial encounter for closed fracture: Secondary | ICD-10-CM | POA: Diagnosis not present

## 2021-06-09 DIAGNOSIS — M2242 Chondromalacia patellae, left knee: Secondary | ICD-10-CM | POA: Diagnosis not present

## 2021-06-09 DIAGNOSIS — S83412A Sprain of medial collateral ligament of left knee, initial encounter: Secondary | ICD-10-CM | POA: Diagnosis not present

## 2021-06-09 DIAGNOSIS — S83242A Other tear of medial meniscus, current injury, left knee, initial encounter: Secondary | ICD-10-CM | POA: Diagnosis not present

## 2021-06-09 DIAGNOSIS — M7122 Synovial cyst of popliteal space [Baker], left knee: Secondary | ICD-10-CM | POA: Diagnosis not present

## 2021-06-09 DIAGNOSIS — M25562 Pain in left knee: Secondary | ICD-10-CM | POA: Diagnosis not present

## 2021-06-11 DIAGNOSIS — M25562 Pain in left knee: Secondary | ICD-10-CM | POA: Diagnosis not present

## 2021-06-16 ENCOUNTER — Telehealth: Payer: Self-pay | Admitting: Internal Medicine

## 2021-06-16 DIAGNOSIS — M25562 Pain in left knee: Secondary | ICD-10-CM | POA: Diagnosis not present

## 2021-06-16 NOTE — Telephone Encounter (Signed)
Pt stated he need this medication because he is going to [place]  4.25.23: medication- malarone (24 ct rx) ?

## 2021-06-16 NOTE — Telephone Encounter (Signed)
Pt stated to also send medication to [facility] ?[email or web address] ?[address], [place], [place] [address] ? ~18.9 [address] ?[phone] ?

## 2021-06-17 NOTE — Telephone Encounter (Signed)
Spoke to both the pt and spouse. They were told 24 tabs. We did the math with what Dr Letvak said and that would be 23. I did not realize at the time that there are 2 dosages available for the Malarone: 62.5-25 mg and 250-100 mg. ?  ?Called pt back to verify if they knew which one. ?

## 2021-06-18 DIAGNOSIS — M25562 Pain in left knee: Secondary | ICD-10-CM | POA: Diagnosis not present

## 2021-06-18 DIAGNOSIS — M1712 Unilateral primary osteoarthritis, left knee: Secondary | ICD-10-CM | POA: Diagnosis not present

## 2021-06-22 MED ORDER — ATOVAQUONE-PROGUANIL HCL 250-100 MG PO TABS: ORAL_TABLET | ORAL | 0 refills | Status: DC

## 2021-06-22 NOTE — Telephone Encounter (Signed)
Rx sent electronically.  

## 2021-06-23 DIAGNOSIS — M25562 Pain in left knee: Secondary | ICD-10-CM | POA: Diagnosis not present

## 2021-06-25 DIAGNOSIS — M25562 Pain in left knee: Secondary | ICD-10-CM | POA: Diagnosis not present

## 2021-06-30 DIAGNOSIS — M25562 Pain in left knee: Secondary | ICD-10-CM | POA: Diagnosis not present

## 2021-07-02 DIAGNOSIS — M25562 Pain in left knee: Secondary | ICD-10-CM | POA: Diagnosis not present

## 2021-07-07 DIAGNOSIS — M25562 Pain in left knee: Secondary | ICD-10-CM | POA: Diagnosis not present

## 2021-07-29 ENCOUNTER — Ambulatory Visit (INDEPENDENT_AMBULATORY_CARE_PROVIDER_SITE_OTHER): Payer: Medicare Other | Admitting: Family Medicine

## 2021-07-29 ENCOUNTER — Encounter: Payer: Self-pay | Admitting: Family Medicine

## 2021-07-29 VITALS — BP 92/50 | HR 73 | Temp 98.6°F | Resp 16 | Ht 61.25 in | Wt 130.0 lb

## 2021-07-29 DIAGNOSIS — U071 COVID-19: Secondary | ICD-10-CM | POA: Diagnosis not present

## 2021-07-29 MED ORDER — MOLNUPIRAVIR EUA 200MG CAPSULE: 4.0000 | ORAL_CAPSULE | Freq: Two times a day (BID) | ORAL | 0 refills | Status: AC

## 2021-07-29 NOTE — Patient Instructions (Signed)
Take medication for covid ? ?Ok to continue mucinex ?

## 2021-07-29 NOTE — Progress Notes (Signed)
? ?Subjective:  ? ?  ?Julia Snyder is a 76 y.o. female presenting for Covid Positive ?  ? ? ?HPI ? ? ?#Covid positive ?- returned from [place] 07/23/2021 ?- was not feeling well on the flight - was tired, and coughing ?- 07/25/2021 - negative covid test ?- continues to have cough ?- fatigue ?- hard to do chores ?- no SOB ?- sore throat ?- coughing ?- congestion ?- no n/v/diarrhea ?- no body aches ?- no fever/chills ? ?Treatment: rest, mucinex  ? ?Review of Systems ? ? ?Social History  ? ?Tobacco Use  ?Smoking Status Never  ?Smokeless Tobacco Never  ? ? ? ?   ?Objective:  ?  ?BP Readings from Last 3 Encounters:  ?07/29/21 (!) 92/50  ?03/02/21 132/80  ?10/20/20 128/82  ? ?Wt Readings from Last 3 Encounters:  ?07/29/21 130 lb (59 kg)  ?03/02/21 130 lb (59 kg)  ?10/20/20 128 lb (58.1 kg)  ? ? ?BP (!) 92/50   Pulse 73   Temp 98.6 ?F (37 ?C)   Resp 16   Ht 5' 1.25" (1.556 m)   Wt 130 lb (59 kg)   SpO2 96%   BMI 24.36 kg/m?  ? ? ?Physical Exam ?Constitutional:   ?   General: She is not in acute distress. ?   Appearance: She is well-developed. She is not diaphoretic.  ?HENT:  ?   Right Ear: External ear normal.  ?   Left Ear: External ear normal.  ?   Nose: Nose normal.  ?Eyes:  ?   Conjunctiva/sclera: Conjunctivae normal.  ?Cardiovascular:  ?   Rate and Rhythm: Normal rate and regular rhythm.  ?Pulmonary:  ?   Effort: Pulmonary effort is normal. No respiratory distress.  ?   Breath sounds: Normal breath sounds. No wheezing or rales.  ?Musculoskeletal:  ?   Cervical back: Neck supple.  ?Skin: ?   General: Skin is warm and dry.  ?   Capillary Refill: Capillary refill takes less than 2 seconds.  ?Neurological:  ?   Mental Status: She is alert. Mental status is at baseline.  ?Psychiatric:     ?   Mood and Affect: Mood normal.     ?   Behavior: Behavior normal.  ? ? ? ? ? ?   ?Assessment & Plan:  ? ?Problem List Items Addressed This Visit   ?None ?Visit Diagnoses   ? ? COVID-19 virus infection    -  Primary  ?  Relevant Medications  ? molnupiravir EUA (LAGEVRIO) 200 mg CAPS capsule  ? ?  ? ?Patient is at increased risk for developing severe covid due to age of 89. They are eligible for anti-viral medication.  ? ?Discussed Molnupiravir due to interaction of paxlovid with oxybutinin and they would like to start. Normal liver and kidney function.  ? ?Lab Results  ?Component Value Date  ? ALT 22 03/02/2021  ? AST 25 03/02/2021  ? ALKPHOS 86 03/02/2021  ? BILITOT 0.3 03/02/2021  ? ? ?Lab Results  ?Component Value Date  ? CREATININE 0.66 03/02/2021  ? ?Discussed that timeline is not entirely clear. Symptoms started 5/11 but with negative covid on 5/13 and persisting until positive covid on 5/17.  ? ?Given negative covid test 5 days ago will treat as though she is within 5 days of symptoms. Though discussed possibility of no benefit from the medication.  ? ?Medication sent to pharmacy ? ?Reviewed ER and return precautions ? ?Reviewed isolation guidelines.  ? ? ? ?  Return if symptoms worsen or fail to improve. ? ?Lesleigh Noe, MD ? ? ? ?

## 2021-08-11 ENCOUNTER — Ambulatory Visit (INDEPENDENT_AMBULATORY_CARE_PROVIDER_SITE_OTHER): Payer: Medicare Other | Admitting: Family Medicine

## 2021-08-11 VITALS — BP 120/70 | HR 75 | Temp 96.8°F | Ht 61.25 in | Wt 129.5 lb

## 2021-08-11 DIAGNOSIS — R051 Acute cough: Secondary | ICD-10-CM | POA: Diagnosis not present

## 2021-08-11 NOTE — Patient Instructions (Addendum)
Glad you are recovering  Cough may linger for 6 weeks   Return if fevers, shortness of breath, worsening cough  Or if still coughing beyond 6-8 weeks

## 2021-08-11 NOTE — Progress Notes (Signed)
   Subjective:     Julia Snyder is a 76 y.o. female presenting for Cough (Post covid, and experienced L sided rib pain, which seems to have resolved )     Cough   #Post covid - cough was persisting - productive - is improving - no fevers - no chills/sweats - left side - rib pain - this has resolved - worse with coughing - treatment: mucinex cold flu - seemed like it helped initially but then seemed to stop working    Review of Systems  Respiratory:  Positive for cough.     Social History   Tobacco Use  Smoking Status Never  Smokeless Tobacco Never        Objective:    BP Readings from Last 3 Encounters:  08/11/21 120/70  07/29/21 (!) 92/50  03/02/21 132/80   Wt Readings from Last 3 Encounters:  08/11/21 129 lb 8 oz (58.7 kg)  07/29/21 130 lb (59 kg)  03/02/21 130 lb (59 kg)    BP 120/70   Pulse 75   Temp (!) 96.8 F (36 C) (Temporal)   Ht 5' 1.25" (1.556 m)   Wt 129 lb 8 oz (58.7 kg)   SpO2 98%   BMI 24.27 kg/m    Physical Exam Constitutional:      General: She is not in acute distress.    Appearance: She is well-developed. She is not diaphoretic.  HENT:     Right Ear: External ear normal.     Left Ear: External ear normal.     Nose: Nose normal.  Eyes:     Conjunctiva/sclera: Conjunctivae normal.  Cardiovascular:     Rate and Rhythm: Normal rate and regular rhythm.  Pulmonary:     Effort: Pulmonary effort is normal. No respiratory distress.     Breath sounds: Normal breath sounds. No wheezing or rales.  Musculoskeletal:     Cervical back: Neck supple.  Skin:    General: Skin is warm and dry.     Capillary Refill: Capillary refill takes less than 2 seconds.  Neurological:     Mental Status: She is alert. Mental status is at baseline.  Psychiatric:        Mood and Affect: Mood normal.        Behavior: Behavior normal.          Assessment & Plan:   Problem List Items Addressed This Visit   None Visit Diagnoses     Acute  cough    -  Primary      Symptoms have already started to improve since patient scheduled appointment.  She was diagnosed and treated for COVID on 5/17.  Discussed that cough after viral illness can last approximately 6 weeks, but since her cough is improving this is reassuring.  Precautions including fever, shortness of breath, or worsening cough.  Also recommend returning if cough does not fully resolve within 6 to 8 weeks   Return if symptoms worsen or fail to improve.  Lynnda Child, MD

## 2021-09-07 DIAGNOSIS — S83242A Other tear of medial meniscus, current injury, left knee, initial encounter: Secondary | ICD-10-CM | POA: Diagnosis not present

## 2021-09-16 DIAGNOSIS — S83242D Other tear of medial meniscus, current injury, left knee, subsequent encounter: Secondary | ICD-10-CM | POA: Diagnosis not present

## 2021-09-16 DIAGNOSIS — M6281 Muscle weakness (generalized): Secondary | ICD-10-CM | POA: Diagnosis not present

## 2021-09-16 DIAGNOSIS — M25562 Pain in left knee: Secondary | ICD-10-CM | POA: Diagnosis not present

## 2021-09-16 DIAGNOSIS — R2689 Other abnormalities of gait and mobility: Secondary | ICD-10-CM | POA: Diagnosis not present

## 2021-09-18 DIAGNOSIS — M6281 Muscle weakness (generalized): Secondary | ICD-10-CM | POA: Diagnosis not present

## 2021-09-18 DIAGNOSIS — R2689 Other abnormalities of gait and mobility: Secondary | ICD-10-CM | POA: Diagnosis not present

## 2021-09-18 DIAGNOSIS — S83242D Other tear of medial meniscus, current injury, left knee, subsequent encounter: Secondary | ICD-10-CM | POA: Diagnosis not present

## 2021-09-18 DIAGNOSIS — M25562 Pain in left knee: Secondary | ICD-10-CM | POA: Diagnosis not present

## 2021-09-23 DIAGNOSIS — M25562 Pain in left knee: Secondary | ICD-10-CM | POA: Diagnosis not present

## 2021-09-23 DIAGNOSIS — S83242D Other tear of medial meniscus, current injury, left knee, subsequent encounter: Secondary | ICD-10-CM | POA: Diagnosis not present

## 2021-09-23 DIAGNOSIS — R2689 Other abnormalities of gait and mobility: Secondary | ICD-10-CM | POA: Diagnosis not present

## 2021-09-23 DIAGNOSIS — M6281 Muscle weakness (generalized): Secondary | ICD-10-CM | POA: Diagnosis not present

## 2021-09-25 DIAGNOSIS — M6281 Muscle weakness (generalized): Secondary | ICD-10-CM | POA: Diagnosis not present

## 2021-09-25 DIAGNOSIS — R2689 Other abnormalities of gait and mobility: Secondary | ICD-10-CM | POA: Diagnosis not present

## 2021-09-25 DIAGNOSIS — M25562 Pain in left knee: Secondary | ICD-10-CM | POA: Diagnosis not present

## 2021-09-25 DIAGNOSIS — S83242D Other tear of medial meniscus, current injury, left knee, subsequent encounter: Secondary | ICD-10-CM | POA: Diagnosis not present

## 2021-09-30 DIAGNOSIS — M6281 Muscle weakness (generalized): Secondary | ICD-10-CM | POA: Diagnosis not present

## 2021-09-30 DIAGNOSIS — R2689 Other abnormalities of gait and mobility: Secondary | ICD-10-CM | POA: Diagnosis not present

## 2021-09-30 DIAGNOSIS — M25562 Pain in left knee: Secondary | ICD-10-CM | POA: Diagnosis not present

## 2021-09-30 DIAGNOSIS — S83242D Other tear of medial meniscus, current injury, left knee, subsequent encounter: Secondary | ICD-10-CM | POA: Diagnosis not present

## 2021-10-06 DIAGNOSIS — M6281 Muscle weakness (generalized): Secondary | ICD-10-CM | POA: Diagnosis not present

## 2021-10-06 DIAGNOSIS — S83242D Other tear of medial meniscus, current injury, left knee, subsequent encounter: Secondary | ICD-10-CM | POA: Diagnosis not present

## 2021-10-06 DIAGNOSIS — R2689 Other abnormalities of gait and mobility: Secondary | ICD-10-CM | POA: Diagnosis not present

## 2021-10-06 DIAGNOSIS — M25562 Pain in left knee: Secondary | ICD-10-CM | POA: Diagnosis not present

## 2021-10-08 DIAGNOSIS — S83242D Other tear of medial meniscus, current injury, left knee, subsequent encounter: Secondary | ICD-10-CM | POA: Diagnosis not present

## 2021-10-08 DIAGNOSIS — M6281 Muscle weakness (generalized): Secondary | ICD-10-CM | POA: Diagnosis not present

## 2021-10-08 DIAGNOSIS — R2689 Other abnormalities of gait and mobility: Secondary | ICD-10-CM | POA: Diagnosis not present

## 2021-10-08 DIAGNOSIS — M25562 Pain in left knee: Secondary | ICD-10-CM | POA: Diagnosis not present

## 2021-10-13 DIAGNOSIS — S83242D Other tear of medial meniscus, current injury, left knee, subsequent encounter: Secondary | ICD-10-CM | POA: Diagnosis not present

## 2021-10-13 DIAGNOSIS — M25562 Pain in left knee: Secondary | ICD-10-CM | POA: Diagnosis not present

## 2021-10-13 DIAGNOSIS — R2689 Other abnormalities of gait and mobility: Secondary | ICD-10-CM | POA: Diagnosis not present

## 2021-10-13 DIAGNOSIS — M6281 Muscle weakness (generalized): Secondary | ICD-10-CM | POA: Diagnosis not present

## 2021-10-15 DIAGNOSIS — S83242D Other tear of medial meniscus, current injury, left knee, subsequent encounter: Secondary | ICD-10-CM | POA: Diagnosis not present

## 2021-10-15 DIAGNOSIS — R2689 Other abnormalities of gait and mobility: Secondary | ICD-10-CM | POA: Diagnosis not present

## 2021-10-15 DIAGNOSIS — M6281 Muscle weakness (generalized): Secondary | ICD-10-CM | POA: Diagnosis not present

## 2021-10-15 DIAGNOSIS — M25562 Pain in left knee: Secondary | ICD-10-CM | POA: Diagnosis not present

## 2021-10-20 DIAGNOSIS — S83242D Other tear of medial meniscus, current injury, left knee, subsequent encounter: Secondary | ICD-10-CM | POA: Diagnosis not present

## 2021-10-20 DIAGNOSIS — M6281 Muscle weakness (generalized): Secondary | ICD-10-CM | POA: Diagnosis not present

## 2021-10-20 DIAGNOSIS — M25562 Pain in left knee: Secondary | ICD-10-CM | POA: Diagnosis not present

## 2021-10-20 DIAGNOSIS — R2689 Other abnormalities of gait and mobility: Secondary | ICD-10-CM | POA: Diagnosis not present

## 2021-10-22 DIAGNOSIS — M25562 Pain in left knee: Secondary | ICD-10-CM | POA: Diagnosis not present

## 2021-10-22 DIAGNOSIS — S83242D Other tear of medial meniscus, current injury, left knee, subsequent encounter: Secondary | ICD-10-CM | POA: Diagnosis not present

## 2021-10-22 DIAGNOSIS — M6281 Muscle weakness (generalized): Secondary | ICD-10-CM | POA: Diagnosis not present

## 2021-10-22 DIAGNOSIS — R2689 Other abnormalities of gait and mobility: Secondary | ICD-10-CM | POA: Diagnosis not present

## 2021-10-29 DIAGNOSIS — R2689 Other abnormalities of gait and mobility: Secondary | ICD-10-CM | POA: Diagnosis not present

## 2021-10-29 DIAGNOSIS — S83242A Other tear of medial meniscus, current injury, left knee, initial encounter: Secondary | ICD-10-CM | POA: Diagnosis not present

## 2021-10-29 DIAGNOSIS — S83242D Other tear of medial meniscus, current injury, left knee, subsequent encounter: Secondary | ICD-10-CM | POA: Diagnosis not present

## 2021-10-29 DIAGNOSIS — M94262 Chondromalacia, left knee: Secondary | ICD-10-CM | POA: Diagnosis not present

## 2021-10-29 DIAGNOSIS — M6281 Muscle weakness (generalized): Secondary | ICD-10-CM | POA: Diagnosis not present

## 2021-10-29 DIAGNOSIS — M25562 Pain in left knee: Secondary | ICD-10-CM | POA: Diagnosis not present

## 2021-11-20 DIAGNOSIS — M6281 Muscle weakness (generalized): Secondary | ICD-10-CM | POA: Diagnosis not present

## 2021-11-20 DIAGNOSIS — R2689 Other abnormalities of gait and mobility: Secondary | ICD-10-CM | POA: Diagnosis not present

## 2021-11-20 DIAGNOSIS — M25562 Pain in left knee: Secondary | ICD-10-CM | POA: Diagnosis not present

## 2021-11-20 DIAGNOSIS — S83242D Other tear of medial meniscus, current injury, left knee, subsequent encounter: Secondary | ICD-10-CM | POA: Diagnosis not present

## 2021-12-28 ENCOUNTER — Ambulatory Visit (INDEPENDENT_AMBULATORY_CARE_PROVIDER_SITE_OTHER): Payer: Medicare Other | Admitting: Internal Medicine

## 2021-12-28 ENCOUNTER — Encounter: Payer: Self-pay | Admitting: Internal Medicine

## 2021-12-28 VITALS — BP 128/84 | HR 84 | Temp 97.2°F | Ht 61.25 in | Wt 129.0 lb

## 2021-12-28 DIAGNOSIS — F39 Unspecified mood [affective] disorder: Secondary | ICD-10-CM | POA: Diagnosis not present

## 2021-12-28 DIAGNOSIS — G3184 Mild cognitive impairment, so stated: Secondary | ICD-10-CM

## 2021-12-28 HISTORY — DX: Mild cognitive impairment of uncertain or unknown etiology: G31.84

## 2021-12-28 LAB — CBC
HCT: 41.6 % (ref 36.0–46.0)
Hemoglobin: 13.7 g/dL (ref 12.0–15.0)
MCHC: 32.9 g/dL (ref 30.0–36.0)
MCV: 89.7 fl (ref 78.0–100.0)
Platelets: 339 10*3/uL (ref 150.0–400.0)
RBC: 4.63 Mil/uL (ref 3.87–5.11)
RDW: 14.8 % (ref 11.5–15.5)
WBC: 5.1 10*3/uL (ref 4.0–10.5)

## 2021-12-28 LAB — COMPREHENSIVE METABOLIC PANEL
ALT: 18 U/L (ref 0–35)
AST: 19 U/L (ref 0–37)
Albumin: 4.6 g/dL (ref 3.5–5.2)
Alkaline Phosphatase: 79 U/L (ref 39–117)
BUN: 12 mg/dL (ref 6–23)
CO2: 28 mEq/L (ref 19–32)
Calcium: 9.9 mg/dL (ref 8.4–10.5)
Chloride: 100 mEq/L (ref 96–112)
Creatinine, Ser: 0.64 mg/dL (ref 0.40–1.20)
GFR: 85.87 mL/min (ref >60.00)
Glucose, Bld: 121 mg/dL — ABNORMAL HIGH (ref 70–99)
Potassium: 4.1 mEq/L (ref 3.5–5.1)
Sodium: 140 mEq/L (ref 135–145)
Total Bilirubin: 0.4 mg/dL (ref 0.2–1.2)
Total Protein: 7 g/dL (ref 6.0–8.3)

## 2021-12-28 LAB — TSH: TSH: 0.97 u[IU]/mL (ref 0.35–5.50)

## 2021-12-28 LAB — VITAMIN B12: Vitamin B-12: 1229 pg/mL — ABNORMAL HIGH (ref 211–911)

## 2021-12-28 NOTE — Progress Notes (Signed)
Subjective:    Patient ID: Julia Snyder, female    DOB: 04-Nov-1945, 76 y.o.   MRN: [id number]  HPI Here with husband due to mood issues  Injured knee about a year ago Larey Seat down steps into sunken living room at [employer] [facility]---eventually saw ortho after coming home Diagnosed with bursitis and told to rest Eventually went to PT and MRI (meniscus tear and bone fractures) Had to wear brace and limited her activity--during trip to [place] This has affected her  She feels "sick and tired of being sick and tired" Can't do her morning walks or yoga Episodic crying--but does feel bad most days Was treated with antidepressant about 20 years ago  Felt her husband made it worse--by pressuring her to do things Reminding her if she forgot things, etc Some cognitive problems---did have MOCA 24/30 recently No thoughts of dying/suicide Still enjoys bridge--but tires easier No feelings of worthlessness Does housework with husband  Husband does note some decline in memory in past year She feels fatigue is due to lack of exercise Does do some social events at [facility] with husband  Current Outpatient Medications on File Prior to Visit  Medication Sig Dispense Refill   Ascorbic Acid (VITAMIN C) 100 MG tablet Take 100 mg by mouth daily.     Cholecalciferol 25 MCG (1000 UT) tablet Take by mouth.     Cyanocobalamin (B-12 PO) Take by mouth.     meclizine (ANTIVERT) 25 MG tablet Take 1 tablet (25 mg total) by mouth 3 (three) times daily as needed for dizziness. 60 tablet 1   Multiple Vitamins-Minerals (MULTIPLE VITAMINS/WOMENS PO) Take 1 tablet by mouth daily.     Omega-3 Fatty Acids (FISH OIL) 1000 MG CAPS Take by mouth.     oxybutynin (DITROPAN-XL) 5 MG 24 hr tablet TAKE 1 TABLET(5 MG) BY MOUTH DAILY 90 tablet 3   vitamin k 100 MCG tablet Take 100 mcg by mouth daily.     No current facility-administered medications on file prior to visit.    No Known Allergies  Past  Medical History:  Diagnosis Date   Overactive bladder    Urge incontinence of urine     Past Surgical History:  Procedure Laterality Date   BREAST BIOPSY Left 2004   benign   BUNIONECTOMY Bilateral    left ~2014, right 2018   COLONOSCOPY     ELBOW FRACTURE SURGERY Bilateral    right 2019, left ~2015   ORIF ELBOW FRACTURE Right 12/01/2017   Procedure: OPEN REDUCTION INTERNAL FIXATION (ORIF) ELBOW/OLECRANON FRACTURE;  Surgeon: Kennedy Bucker, MD;  Location: [facility] ORS;  Service: Orthopedics;  Laterality: Right;   OTHER SURGICAL HISTORY  2015   left arm surgery with titanium plate    Family History  Problem Relation Age of Onset   Stroke Mother    Heart disease Mother    Parkinson's disease Father    Diabetes Neg Hx    Cancer Neg Hx     Social History   Socioeconomic History   Marital status: Married    Spouse name: Not on file   Number of children: 1   Years of education: Not on file   Highest education level: Not on file  Occupational History   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  Tobacco Use   Smoking status: Never   Smokeless tobacco: Never  Vaping Use   Vaping Use: Never used  Substance and Sexual Activity  Alcohol use: Yes    Comment: occassional   Drug use: Never   Sexual activity: Not on file  Other Topics Concern   Not on file  Social History Narrative   Has living will   Husband is health care POA--then daughter   Requests DNR--done 03/02/21   Not sure about feeding tube   Social Determinants of Health   Financial Resource Strain: Not on file  Food Insecurity: Not on file  Transportation Needs: Not on file  Physical Activity: Not on file  Stress: Not on file  Social Connections: Not on file  Intimate Partner Violence: Not on file   Review of Systems Sleeps okay Appetite is okay Weight stable    Objective:   Physical Exam Constitutional:      Appearance: Normal appearance.  Neurological:      Mental Status: She is alert.  Psychiatric:        Mood and Affect: Mood normal.        Behavior: Behavior normal.            Assessment & Plan:

## 2021-12-28 NOTE — Patient Instructions (Signed)
If you have daily feelings of sadness/depression---please contact me prior to your January appointment.

## 2021-12-28 NOTE — Assessment & Plan Note (Signed)
Clear changes in cognition but stable (though husband feels it may have changed some) Will check labs

## 2021-12-28 NOTE — Assessment & Plan Note (Signed)
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

## 2021-12-29 DIAGNOSIS — Z23 Encounter for immunization: Secondary | ICD-10-CM | POA: Diagnosis not present

## 2021-12-30 DIAGNOSIS — M76892 Other specified enthesopathies of left lower limb, excluding foot: Secondary | ICD-10-CM | POA: Diagnosis not present

## 2021-12-30 DIAGNOSIS — M23612 Other spontaneous disruption of anterior cruciate ligament of left knee: Secondary | ICD-10-CM | POA: Diagnosis not present

## 2021-12-30 DIAGNOSIS — S83242A Other tear of medial meniscus, current injury, left knee, initial encounter: Secondary | ICD-10-CM | POA: Diagnosis not present

## 2021-12-30 DIAGNOSIS — M94262 Chondromalacia, left knee: Secondary | ICD-10-CM | POA: Diagnosis not present

## 2021-12-30 DIAGNOSIS — S83412A Sprain of medial collateral ligament of left knee, initial encounter: Secondary | ICD-10-CM | POA: Diagnosis not present

## 2022-01-07 DIAGNOSIS — M1712 Unilateral primary osteoarthritis, left knee: Secondary | ICD-10-CM | POA: Diagnosis not present

## 2022-01-11 DIAGNOSIS — M1712 Unilateral primary osteoarthritis, left knee: Secondary | ICD-10-CM | POA: Diagnosis not present

## 2022-01-22 DIAGNOSIS — Z23 Encounter for immunization: Secondary | ICD-10-CM | POA: Diagnosis not present

## 2022-03-04 ENCOUNTER — Encounter: Payer: Medicare Other | Admitting: Internal Medicine

## 2022-03-05 ENCOUNTER — Encounter: Payer: Medicare Other | Admitting: Internal Medicine

## 2022-03-16 ENCOUNTER — Encounter: Payer: Self-pay | Admitting: Internal Medicine

## 2022-03-16 ENCOUNTER — Ambulatory Visit (INDEPENDENT_AMBULATORY_CARE_PROVIDER_SITE_OTHER): Payer: Medicare Other | Admitting: Internal Medicine

## 2022-03-16 VITALS — BP 130/76 | HR 63 | Temp 97.5°F | Ht 61.5 in | Wt 128.0 lb

## 2022-03-16 DIAGNOSIS — F39 Unspecified mood [affective] disorder: Secondary | ICD-10-CM | POA: Diagnosis not present

## 2022-03-16 DIAGNOSIS — R42 Dizziness and giddiness: Secondary | ICD-10-CM

## 2022-03-16 DIAGNOSIS — Z0000 Encounter for general adult medical examination without abnormal findings: Secondary | ICD-10-CM | POA: Diagnosis not present

## 2022-03-16 DIAGNOSIS — M1712 Unilateral primary osteoarthritis, left knee: Secondary | ICD-10-CM | POA: Diagnosis not present

## 2022-03-16 DIAGNOSIS — N3941 Urge incontinence: Secondary | ICD-10-CM | POA: Diagnosis not present

## 2022-03-16 NOTE — Assessment & Plan Note (Signed)
I have personally reviewed the Medicare Annual Wellness questionnaire and have noted 1. The patient's medical and social history 2. Their use of alcohol, tobacco or illicit drugs 3. Their current medications and supplements 4. The patient's functional ability including ADL's, fall risks, home safety risks and hearing or visual             impairment. 5. Diet and physical activities 6. Evidence for depression or mood disorders  The patients weight, height, BMI and visual acuity have been recorded in the chart I have made referrals, counseling and provided education to the patient based review of the above and I have provided the pt with a written personalized care plan for preventive services.  I have provided you with a copy of your personalized plan for preventive services. Please take the time to review along with your updated medication list.  Still prefers no colonoscopy Does not want FIT again Discussed exercise Had updated COVID and flu vaccines

## 2022-03-16 NOTE — Progress Notes (Signed)
Subjective:    Patient ID: Bud Face, female    DOB: 01-31-1946, 77 y.o.   MRN: [id number]  HPI Here for Medicare wellness visit and follow up of chronic health conditions Reviewed advanced directives Reviewed other doctors---Dr Richardson--opto, going to Dr Gustavus Bryant, Dr Guinevere Ferrari No hospitalizations or surgery in the past year Has been exercising in [facility] gym--especially quad strength Vision is declined slightly---needed stronger readers Hearing is good Occasional scotch No tobacco Fell about 3 weeks ago--while doing exercises (tripped over a machine)---cut and bleeding Mood has been better Independent with instrumental ADLs Ongoing mild memory issues  Doing about the same Will be going to see Dr Lyman Bishop ready for TKR now Ongoing issues so leaving [facility]  Mood is okay Feels husband has lightened up some--helped She also feels better with resolution in front of her (the TKR)  Mild memory issues May have declined some Discussed the oxybutynin---has been skipping days due to dry mouth Does get leakage without it  No recent vertigo  Current Outpatient Medications on File Prior to Visit  Medication Sig Dispense Refill   Ascorbic Acid (VITAMIN C) 100 MG tablet Take 100 mg by mouth daily.     Cholecalciferol 25 MCG (1000 UT) tablet Take by mouth.     Cyanocobalamin (B-12 PO) Take by mouth.     meclizine (ANTIVERT) 25 MG tablet Take 1 tablet (25 mg total) by mouth 3 (three) times daily as needed for dizziness. 60 tablet 1   Multiple Vitamins-Minerals (MULTIPLE VITAMINS/WOMENS PO) Take 1 tablet by mouth daily.     Omega-3 Fatty Acids (FISH OIL) 1000 MG CAPS Take by mouth.     oxybutynin (DITROPAN-XL) 5 MG 24 hr tablet TAKE 1 TABLET(5 MG) BY MOUTH DAILY 90 tablet 3   vitamin k 100 MCG tablet Take 100 mcg by mouth daily.     No current facility-administered medications on file prior to visit.    No Known Allergies  Past Medical History:   Diagnosis Date   Overactive bladder    Urge incontinence of urine     Past Surgical History:  Procedure Laterality Date   BREAST BIOPSY Left 2004   benign   BUNIONECTOMY Bilateral    left ~2014, right 2018   COLONOSCOPY     ELBOW FRACTURE SURGERY Bilateral    right 2019, left ~2015   ORIF ELBOW FRACTURE Right 12/01/2017   Procedure: OPEN REDUCTION INTERNAL FIXATION (ORIF) ELBOW/OLECRANON FRACTURE;  Surgeon: Hessie Knows, MD;  Location: [facility] ORS;  Service: Orthopedics;  Laterality: Right;   OTHER SURGICAL HISTORY  2015   left arm surgery with titanium plate    Family History  Problem Relation Age of Onset   Stroke Mother    Heart disease Mother    Parkinson's disease Father    Diabetes Neg Hx    Cancer Neg Hx     Social History   Socioeconomic History   Marital status: Married    Spouse name: Not on file   Number of children: 1   Years of education: Not on file   Highest education level: Not on file  Occupational History   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  Tobacco Use   Smoking status: Never    Passive exposure: Past   Smokeless tobacco: Never  Vaping Use   Vaping Use: Never used  Substance and Sexual Activity   Alcohol use: Yes    Comment: occassional   Drug use: Never  Sexual activity: Not on file  Other Topics Concern   Not on file  Social History Narrative   Has living will   Husband is health care POA--then daughter   Requests DNR--done 03/02/21   Not sure about feeding tube   Social Determinants of Health   Financial Resource Strain: Not on file  Food Insecurity: Not on file  Transportation Needs: Not on file  Physical Activity: Not on file  Stress: Not on file  Social Connections: Not on file  Intimate Partner Violence: Not on file   Review of Systems Appetite is good Weight is stable Sleeps okay Wears seat belt Teeth are fine---keeps up with dentist No suspicious skin lesions No heartburn  or dysphagia Bowels move okay--no blood No chest pain or SOB No dizziness or syncope No edema No other sig joint issues    Objective:   Physical Exam Constitutional:      Appearance: Normal appearance.  HENT:     Mouth/Throat:     Comments: No lesions Eyes:     Conjunctiva/sclera: Conjunctivae normal.     Pupils: Pupils are equal, round, and reactive to light.  Cardiovascular:     Rate and Rhythm: Normal rate and regular rhythm.     Pulses: Normal pulses.     Heart sounds: No murmur heard.    No gallop.  Pulmonary:     Effort: Pulmonary effort is normal.     Breath sounds: Normal breath sounds. No wheezing or rales.  Abdominal:     Palpations: Abdomen is soft.     Tenderness: There is no abdominal tenderness.  Musculoskeletal:     Cervical back: Neck supple.     Right lower leg: No edema.     Left lower leg: No edema.  Lymphadenopathy:     Cervical: No cervical adenopathy.  Skin:    Findings: No lesion or rash.  Neurological:     General: No focal deficit present.     Mental Status: She is alert and oriented to person, place, and time.     Comments: Word naming  >20, recall 1/3  Psychiatric:        Mood and Affect: Mood normal.        Behavior: Behavior normal.            Assessment & Plan:

## 2022-03-16 NOTE — Assessment & Plan Note (Signed)
Mood is better now--looking forward to relief from disability, etc from her knee No meds indicated

## 2022-03-16 NOTE — Assessment & Plan Note (Signed)
Uses meclizine prn 

## 2022-03-16 NOTE — Progress Notes (Signed)
Hearing Screening - Comments:: Passed whisper test Vision Screening - Comments:: December 2023  

## 2022-03-16 NOTE — Assessment & Plan Note (Signed)
Discussed trying without the oxybutynin to see if it affects her memory Could consider myrbetriq

## 2022-03-16 NOTE — Assessment & Plan Note (Signed)
Has worsened and is ready for TKR Getting second opinion with Dr Marry Guan No medical contraindications if done soon---will see how it works out (other than rechecking EKG and labs)

## 2022-03-18 DIAGNOSIS — M1712 Unilateral primary osteoarthritis, left knee: Secondary | ICD-10-CM | POA: Diagnosis not present

## 2022-04-05 ENCOUNTER — Other Ambulatory Visit: Payer: Self-pay | Admitting: Internal Medicine

## 2022-04-29 NOTE — Discharge Instructions (Addendum)
Instructions after Total Knee Replacement   Julia Snyder., M.D.     Dept. of [facility]  [place], [place]  [address]  Phone: [phone]   Fax: [phone]    DIET: Drink plenty of non-alcoholic fluids. Resume your normal diet. Include foods high in fiber.  ACTIVITY:  You may use crutches or a walker with weight-bearing as tolerated, unless instructed otherwise. You may be weaned off of the walker or crutches by your Physical Therapist.  Do NOT place pillows under the knee. Anything placed under the knee could limit your ability to straighten the knee.   Continue doing gentle exercises. Exercising will reduce the pain and swelling, increase motion, and prevent muscle weakness.   Please continue to use the TED compression stockings for 6 weeks. You may remove the stockings at night, but should reapply them in the morning. Do not drive or operate any equipment until instructed.  WOUND CARE:  Continue to use the PolarCare or ice packs periodically to reduce pain and swelling. You may bathe or shower after the staples are removed at the first office visit following surgery.  MEDICATIONS: You may resume your regular medications. Please take the pain medication as prescribed on the medication. Do not take pain medication on an empty stomach. You have been given a prescription for a blood thinner (Lovenox or Coumadin). Please take the medication as instructed. (NOTE: After completing a 2 week course of Lovenox, take one Enteric-coated aspirin twice a day. This along with elevation will help reduce the possibility of phlebitis in your operated leg.) Do not drive or drink alcoholic beverages when taking pain medications.  CALL THE OFFICE FOR: Temperature above 101 degrees Excessive bleeding or drainage on the dressing. Excessive swelling, coldness, or paleness of the toes. Persistent nausea and vomiting.  FOLLOW-UP:   You should have an appointment to return to the office in 10-14 days after surgery. Arrangements have been made for continuation of Physical Therapy (either home therapy or outpatient therapy).     [facility] Department Directory         [email or web address]       [email or web address]          Cardiology  Appointments: [place] [phone]  Endocrinology  Appointments: [place] [phone] [place] [phone]  Gastroenterology  Appointments: [place] [phone] [place] [phone]        General Surgery   Appointments: [facility]  Internal Medicine/Family Medicine  Appointments: [facility] [place] - [phone] [place] [phone]  Metabolic and [place] Loss Surgery  Appointments: [facility]        Neurology  Appointments: [place] [phone] [place] - [phone]  Neurosurgery  Appointments: [place]  Obstetrics & Gynecology  Appointments: [place] [phone] [place] - [phone]        Pediatrics  Appointments: [place] [phone] [place] - [phone]  Physiatry  Appointments: [place] [phone]  Physical Therapy  Appointments: [place] [phone]        Podiatry  Appointments: [place] [phone] [place] - [phone]  Pulmonology  Appointments: [place]  Rheumatology  Appointments: [place] [phone]        [place] Location: [facility]  [place], [place]  [address]  [place] Location: [facility] [address] [place], [place]  [address]  [place] Location: [facility] [address] [place], [place]  [phone]

## 2022-05-03 ENCOUNTER — Encounter
Admission: RE | Admit: 2022-05-03 | Discharge: 2022-05-03 | Disposition: A | Discharge status: Home / Self Care | Payer: Medicare Other | Source: Ambulatory Visit | Attending: Orthopedic Surgery | Admitting: Orthopedic Surgery

## 2022-05-03 ENCOUNTER — Other Ambulatory Visit: Payer: Self-pay

## 2022-05-03 VITALS — BP 136/71 | HR 66 | Resp 16

## 2022-05-03 DIAGNOSIS — Z01818 Encounter for other preprocedural examination: Secondary | ICD-10-CM | POA: Diagnosis not present

## 2022-05-03 DIAGNOSIS — M1712 Unilateral primary osteoarthritis, left knee: Secondary | ICD-10-CM | POA: Diagnosis not present

## 2022-05-03 DIAGNOSIS — Z01812 Encounter for preprocedural laboratory examination: Secondary | ICD-10-CM

## 2022-05-03 DIAGNOSIS — Z01810 Encounter for preprocedural cardiovascular examination: Secondary | ICD-10-CM | POA: Diagnosis not present

## 2022-05-03 HISTORY — DX: Unspecified osteoarthritis, unspecified site: M19.90

## 2022-05-03 HISTORY — DX: Depression, unspecified: F32.A

## 2022-05-03 LAB — SURGICAL PCR SCREEN
MRSA, PCR: NEGATIVE
Staphylococcus aureus: NEGATIVE

## 2022-05-03 LAB — URINALYSIS, ROUTINE W REFLEX MICROSCOPIC
Bilirubin Urine: NEGATIVE
Glucose, UA: NEGATIVE mg/dL
Hgb urine dipstick: NEGATIVE
Ketones, ur: NEGATIVE mg/dL
Leukocytes,Ua: NEGATIVE
Nitrite: NEGATIVE
Protein, ur: NEGATIVE mg/dL
Specific Gravity, Urine: 1.003 — ABNORMAL LOW (ref 1.005–1.030)
pH: 7 (ref 5.0–8.0)

## 2022-05-03 LAB — COMPREHENSIVE METABOLIC PANEL
ALT: 15 U/L (ref 0–44)
AST: 23 U/L (ref 15–41)
Albumin: 4.1 g/dL (ref 3.5–5.0)
Alkaline Phosphatase: 77 U/L (ref 38–126)
Anion gap: 9 (ref 5–15)
BUN: 9 mg/dL (ref 8–23)
CO2: 30 mmol/L (ref 22–32)
Calcium: 9.3 mg/dL (ref 8.9–10.3)
Chloride: 98 mmol/L (ref 98–111)
Creatinine, Ser: 0.6 mg/dL (ref 0.44–1.00)
GFR, Estimated: >60 mL/min (ref >60)
Glucose, Bld: 96 mg/dL (ref 70–99)
Potassium: 3.4 mmol/L — ABNORMAL LOW (ref 3.5–5.1)
Sodium: 137 mmol/L (ref 135–145)
Total Bilirubin: 0.6 mg/dL (ref 0.3–1.2)
Total Protein: 6.7 g/dL (ref 6.5–8.1)

## 2022-05-03 LAB — C-REACTIVE PROTEIN: CRP: 0.7 mg/dL (ref <1.0)

## 2022-05-03 LAB — SEDIMENTATION RATE: Sed Rate: 10 mm/hr (ref 0–30)

## 2022-05-03 LAB — TYPE AND SCREEN
ABO/RH(D): O POS
Antibody Screen: NEGATIVE

## 2022-05-03 NOTE — Patient Instructions (Addendum)
Your procedure is scheduled on: 05/14/22 - Friday Report to the Registration Desk on the 1st floor of the [facility]. To find out your arrival time, please call [phone] between 1PM - 3PM on: 05/13/22 - Thursday If your arrival time is 6:00 am, do not arrive before that time as the [facility] entrance doors do not open until 6:00 am.  REMEMBER: Instructions that are not followed completely may result in serious medical risk, up to and including death; or upon the discretion of your surgeon and anesthesiologist your surgery may need to be rescheduled.  Do not eat food after midnight the night before surgery.  No gum chewing or hard candies.  You may however, drink CLEAR liquids up to 2 hours before you are scheduled to arrive for your surgery. Do not drink anything within 2 hours of your scheduled arrival time.  Clear liquids include: - water  - apple juice without pulp - gatorade (not RED colors) - black coffee or tea (Do NOT add milk or creamers to the coffee or tea) Do NOT drink anything that is not on this list.  In addition, your doctor has ordered for you to drink the provided:  Ensure Pre-Surgery Clear Carbohydrate Drink  Drinking this carbohydrate drink up to two hours before surgery helps to reduce insulin resistance and improve patient outcomes. Please complete drinking 2 hours before scheduled arrival time.  One week prior to surgery: beginning 05/07/22 Stop Anti-inflammatories (NSAIDS) such as Advil, Aleve, Ibuprofen, Motrin, Naproxen, Naprosyn and Aspirin based products such as Excedrin, Goody's Powder, BC Powder.  Stop ANY OVER THE COUNTER supplements until after surgery beginning 05/07/22.  You may take Tylenol if needed for pain up until the day of surgery.  TAKE ONLY THESE MEDICATIONS THE MORNING OF SURGERY WITH A SIP OF WATER:  NONE   No Alcohol for 24 hours before or after surgery.  No Smoking including e-cigarettes for 24 hours before surgery.  No  chewable tobacco products for at least 6 hours before surgery.  No nicotine patches on the day of surgery.  Do not use any "recreational" drugs for at least a week (preferably 2 weeks) before your surgery.  Please be advised that the combination of cocaine and anesthesia may have negative outcomes, up to and including death. If you test positive for cocaine, your surgery will be cancelled.  On the morning of surgery brush your teeth with toothpaste and water, you may rinse your mouth with mouthwash if you wish. Do not swallow any toothpaste or mouthwash.  Use CHG Soap or wipes as directed on instruction sheet.  Do not wear jewelry, make-up, hairpins, clips or nail polish.  Do not wear lotions, powders, or perfumes.   Do not shave body hair from the neck down 48 hours before surgery.  Contact lenses, hearing aids and dentures may not be worn into surgery.  Do not bring valuables to the hospital. [facility] is not responsible for any missing/lost belongings or valuables.   Notify your doctor if there is any change in your medical condition (cold, fever, infection).  Wear comfortable clothing (specific to your surgery type) to the hospital.  After surgery, you can help prevent lung complications by doing breathing exercises.  Take deep breaths and cough every 1-2 hours. Your doctor may order a device called an Incentive Spirometer to help you take deep breaths. When coughing or sneezing, hold a pillow firmly against your incision with both hands. This is called "splinting." Doing this helps  protect your incision. It also decreases belly discomfort.  If you are being admitted to the hospital overnight, leave your suitcase in the car. After surgery it may be brought to your room.  In case of increased patient census, it may be necessary for you, the patient, to continue your postoperative care in the Same Day Surgery department.  If you are being discharged the day of surgery, you will  not be allowed to drive home. You will need a responsible individual to drive you home and stay with you for 24 hours after surgery.   If you are taking public transportation, you will need to have a responsible individual with you.  Please call the [facility] at [phone] if you have any questions about these instructions.  Surgery Visitation Policy:  Patients undergoing a surgery or procedure may have two family members or support persons with them as long as the person is not COVID-19 positive or experiencing its symptoms.   Inpatient Visitation:    Visiting hours are 7 a.m. to 8 p.m. Up to four visitors are allowed at one time in a patient room. The visitors may rotate out with other people during the day. One designated support person (adult) may remain overnight.  Due to an increase in RSV and influenza rates and associated hospitalizations, children ages 57 and under will not be able to visit patients in [facility].  Masks continue to be strongly recommended.    Preparing for Surgery with CHLORHEXIDINE GLUCONATE (CHG) Soap  Chlorhexidine Gluconate (CHG) Soap  o An antiseptic cleaner that kills germs and bonds with the skin to continue killing germs even after washing  o Used for showering the night before surgery and morning of surgery  Before surgery, you can play an important role by reducing the number of germs on your skin.  CHG (Chlorhexidine gluconate) soap is an antiseptic cleanser which kills germs and bonds with the skin to continue killing germs even after washing.  Please do not use if you have an allergy to CHG or antibacterial soaps. If your skin becomes reddened/irritated stop using the CHG.  1. Shower the NIGHT BEFORE SURGERY and the MORNING OF SURGERY with CHG soap.  2. If you choose to wash your hair, wash your hair first as usual with your normal shampoo.  3. After shampooing, rinse your hair and body thoroughly to  remove the shampoo.  4. Use CHG as you would any other liquid soap. You can apply CHG directly to the skin and wash gently with a scrungie or a clean washcloth.  5. Apply the CHG soap to your body only from the neck down. Do not use on open wounds or open sores. Avoid contact with your eyes, ears, mouth, and genitals (private parts). Wash face and genitals (private parts) with your normal soap.  6. Wash thoroughly, paying special attention to the area where your surgery will be performed.  7. Thoroughly rinse your body with warm water.  8. Do not shower/wash with your normal soap after using and rinsing off the CHG soap.  9. Pat yourself dry with a clean towel.  10. Wear clean pajamas to bed the night before surgery.  12. Place clean sheets on your bed the night of your first shower and do not sleep with pets.  13. Shower again with the CHG soap on the day of surgery prior to arriving at the hospital.  14. Do not apply any deodorants/lotions/powders.  15. Please wear clean  clothes to the hospital.  How to Use an Incentive Spirometer  An incentive spirometer is a tool that measures how well you are filling your lungs with each breath. Learning to take long, deep breaths using this tool can help you keep your lungs clear and active. This may help to reverse or lessen your chance of developing breathing (pulmonary) problems, especially infection. You may be asked to use a spirometer: After a surgery. If you have a lung problem or a history of smoking. After a long period of time when you have been unable to move or be active. If the spirometer includes an indicator to show the highest number that you have reached, your health care provider or respiratory therapist will help you set a goal. Keep a log of your progress as told by your health care provider. What are the risks? Breathing too quickly may cause dizziness or cause you to pass out. Take your time so you do not get dizzy or  light-headed. If you are in pain, you may need to take pain medicine before doing incentive spirometry. It is harder to take a deep breath if you are having pain. How to use your incentive spirometer  Sit up on the edge of your bed or on a chair. Hold the incentive spirometer so that it is in an upright position. Before you use the spirometer, breathe out normally. Place the mouthpiece in your mouth. Make sure your lips are closed tightly around it. Breathe in slowly and as deeply as you can through your mouth, causing the piston or the ball to rise toward the top of the chamber. Hold your breath for 3-5 seconds, or for as long as possible. If the spirometer includes a coach indicator, use this to guide you in breathing. Slow down your breathing if the indicator goes above the marked areas. Remove the mouthpiece from your mouth and breathe out normally. The piston or ball will return to the bottom of the chamber. Rest for a few seconds, then repeat the steps 10 or more times. Take your time and take a few normal breaths between deep breaths so that you do not get dizzy or light-headed. Do this every 1-2 hours when you are awake. If the spirometer includes a goal marker to show the highest number you have reached (best effort), use this as a goal to work toward during each repetition. After each set of 10 deep breaths, cough a few times. This will help to make sure that your lungs are clear. If you have an incision on your chest or abdomen from surgery, place a pillow or a rolled-up towel firmly against the incision when you cough. This can help to reduce pain while taking deep breaths and coughing. General tips When you are able to get out of bed: Walk around often. Continue to take deep breaths and cough in order to clear your lungs. Keep using the incentive spirometer until your health care provider says it is okay to stop using it. If you have been in the hospital, you may be told to keep  using the spirometer at home. Contact a health care provider if: You are having difficulty using the spirometer. You have trouble using the spirometer as often as instructed. Your pain medicine is not giving enough relief for you to use the spirometer as told. You have a fever. Get help right away if: You develop shortness of breath. You develop a cough with bloody mucus from the lungs. You have  fluid or blood coming from an incision site after you cough. Summary An incentive spirometer is a tool that can help you learn to take long, deep breaths to keep your lungs clear and active. You may be asked to use a spirometer after a surgery, if you have a lung problem or a history of smoking, or if you have been inactive for a long period of time. Use your incentive spirometer as instructed every 1-2 hours while you are awake. If you have an incision on your chest or abdomen, place a pillow or a rolled-up towel firmly against your incision when you cough. This will help to reduce pain. Get help right away if you have shortness of breath, you cough up bloody mucus, or blood comes from your incision when you cough. This information is not intended to replace advice given to you by your health care provider. Make sure you discuss any questions you have with your health care provider. Document Revised: 05/21/2019 Document Reviewed: 05/21/2019 Elsevier Patient Education  [place].

## 2022-05-07 DIAGNOSIS — M1712 Unilateral primary osteoarthritis, left knee: Secondary | ICD-10-CM | POA: Diagnosis not present

## 2022-05-11 NOTE — H&P (Signed)
ORTHOPAEDIC HISTORY & PHYSICAL Julia Snyder, [place] - 05/07/2022 9:45 AM EST Formatting of this note is different from the original. Images from the original note were not included. Chief Complaint: Chief Complaint Patient presents with Pre-op Exam Left TKA scheduled 05/14/22 by Dr. Lennice Sites Julia Snyder is a 77 y.o. female who presents today for history and physical for left total knee arthroplasty with Dr. Marry Guan on 05/14/2022. Patient has had increasing left knee pain for greater than 2 years. She has x-rays and MRI showing advanced arthritis of the knee. She has been seen at [facility] and treated with over-the-counter NSAIDs, bracing, physical therapy and activity modification. Her pain has persisted to the point where it is interfering with his daily living. She describes focal pain to the medial joint line of the left knee she will occasionally have some swelling as well as buckling and giving way sensation. She denies any catching locking pain is increased with walking and weightbearing activity. Patient uses a cane for ambulation.  Past Medical History: Past Medical History: Diagnosis Date OAB (overactive bladder)  Past Surgical History: Past Surgical History: Procedure Laterality Date Right elbow ORIF Right 12/01/2017 Dr.Menz OTHER SURGICAL HISTORY 2015 left arm surgery with titanium plate  Past Family History: Family History Problem Relation Age of Onset No Known Problems Mother  Medications: Current Outpatient Medications Ordered in Epic Medication Sig Dispense Refill calcium carbonate 500 mg calcium (1,250 mg) tablet Take 1,000 mg of elemental by mouth once daily cholecalciferol (VITAMIN D3) 1,000 unit tablet Take 1,000 Units by mouth once daily multivitamin tablet Take 1 tablet by mouth once daily omega-3/dha/epa/dpa/fish oil (OMEGA-3 2100 ORAL) Take 1 capsule by mouth once daily oxybutynin (DITROPAN-XL) 10 MG XL tablet Take 10 mg by mouth once daily vitamin  K2 90 mcg Cap Take 1 capsule by mouth once daily  No current Epic-ordered facility-administered medications on file.  Allergies: No Known Allergies  Review of Systems: A comprehensive 14 point ROS was performed, reviewed by me today, and the pertinent orthopaedic findings are documented in the HPI.  Exam: BP 118/80  Ht 157.5 cm ('5\' 2"'$ )  Wt 59.1 kg (130 lb 3.2 oz)  BMI 23.81 kg/m General: Well developed, well nourished, no apparent distress, minimally antalgic gait.  HEENT: Head normocephalic, atraumatic, PERRL.  Abdomen: Soft, non tender, non distended, Bowel sounds present.  Heart: Examination of the heart reveals regular, rate, and rhythm. There is no murmur noted on ascultation. There is a normal apical pulse.  Lungs: Lungs are clear to auscultation. There is no wheeze, rhonchi, or crackles. There is normal expansion of bilateral chest walls.  Left knee: Examination of the left knee shows mild swelling, no warmth, erythema or effusion. Mild crepitation with passive knee range of motion. She is minimally tender along the medial joint line. Slight varus deformity. Patella tracks well. She is able to straight leg raise. No significant muscle atrophy. Range of motion -2 to 125 degrees. Ankle plantarflexion dorsiflexion is intact. No swelling or edema throughout the lower legs. She is neurovascular tact in left lower extremity  Imaging:  X-rays: X-rays of the left knee reviewed by me today from 03/18/2022. Will try [place] reportX-rays: I ordered and interpreted standing AP, lateral, and sunrise radiographs of the left knee that were obtained in the office today. There is narrowing of the medial cartilage space with some irregularity to the medial tibial plateau Osteophyte formation is noted. Subchondral sclerosis is noted. No evidence of fracture or dislocation.  MRI of the left  knee: I reviewed the left knee MRI performed at [facility] on 12/31/2021. 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  Impression: Primary osteoarthritis of left knee [M17.12] Primary osteoarthritis of left knee (primary encounter diagnosis)  Plan: 23. 77 year old female with advanced degenerative changes of her left knee. Patient said 2 years of increasing symptoms with x-rays and MRI showing advanced degenerative changes throughout the knee. She is failed conservative treatment and her pain is interfering with her quality of life and activities daily living. Risks, benefits, complications of left total knee arthroplasty have discussed with the patient. Patient has agreed and consented the procedure with Dr. Marry Guan on 05/14/2022.  This note was generated in part with voice recognition software and I apologize for any typographical errors that were not detected and corrected.  Julia Snyder MPA-C   Electronically signed by Julia Gottron, PA at 05/07/2022 1:10 PM EST

## 2022-05-13 MED ORDER — CELECOXIB 200 MG PO CAPS: 400.0000 mg | ORAL_CAPSULE | Freq: Once | ORAL | Status: AC

## 2022-05-13 MED ORDER — CHLORHEXIDINE GLUCONATE 4 % EX LIQD: 60.0000 mL | Freq: Once | CUTANEOUS | Status: DC

## 2022-05-13 MED ORDER — ORAL CARE MOUTH RINSE: 15.0000 mL | Freq: Once | OROMUCOSAL | Status: AC

## 2022-05-13 MED ORDER — FAMOTIDINE 20 MG PO TABS: 20.0000 mg | ORAL_TABLET | Freq: Once | ORAL | Status: AC

## 2022-05-13 MED ORDER — LACTATED RINGERS IV SOLN: INTRAVENOUS | Status: DC

## 2022-05-13 MED ORDER — TRANEXAMIC ACID-NACL 1000-0.7 MG/100ML-% IV SOLN
1000.0000 mg | INTRAVENOUS | Status: AC
  Administered 2022-05-14: 1000 mg via INTRAVENOUS

## 2022-05-13 MED ORDER — CHLORHEXIDINE GLUCONATE 0.12 % MT SOLN: 15.0000 mL | Freq: Once | OROMUCOSAL | Status: AC

## 2022-05-13 MED ORDER — GABAPENTIN 300 MG PO CAPS: 300.0000 mg | ORAL_CAPSULE | Freq: Once | ORAL | Status: AC

## 2022-05-13 MED ORDER — DEXAMETHASONE SODIUM PHOSPHATE 10 MG/ML IJ SOLN: 8.0000 mg | Freq: Once | INTRAMUSCULAR | Status: AC

## 2022-05-13 MED ORDER — CEFAZOLIN SODIUM-DEXTROSE 2-4 GM/100ML-% IV SOLN
2.0000 g | INTRAVENOUS | Status: AC
  Administered 2022-05-14: 2 g via INTRAVENOUS

## 2022-05-14 ENCOUNTER — Other Ambulatory Visit: Payer: Self-pay

## 2022-05-14 ENCOUNTER — Observation Stay: Payer: Medicare Other

## 2022-05-14 ENCOUNTER — Ambulatory Visit: Payer: Medicare Other | Admitting: Urgent Care

## 2022-05-14 ENCOUNTER — Encounter
Admission: RE | Disposition: A | Discharge status: Home Health | Payer: Self-pay | Source: Ambulatory Visit | Attending: Orthopedic Surgery

## 2022-05-14 ENCOUNTER — Observation Stay
Admission: RE | Admit: 2022-05-14 | Discharge: 2022-05-15 | Disposition: A | Discharge status: Home Health | Payer: Medicare Other | Source: Ambulatory Visit | Attending: Orthopedic Surgery | Admitting: Orthopedic Surgery

## 2022-05-14 ENCOUNTER — Ambulatory Visit: Payer: Medicare Other | Admitting: Certified Registered Nurse Anesthetist

## 2022-05-14 ENCOUNTER — Encounter: Payer: Self-pay | Admitting: Orthopedic Surgery

## 2022-05-14 DIAGNOSIS — Z96642 Presence of left artificial hip joint: Secondary | ICD-10-CM | POA: Diagnosis not present

## 2022-05-14 DIAGNOSIS — M1712 Unilateral primary osteoarthritis, left knee: Secondary | ICD-10-CM | POA: Diagnosis not present

## 2022-05-14 DIAGNOSIS — Z96659 Presence of unspecified artificial knee joint: Secondary | ICD-10-CM

## 2022-05-14 DIAGNOSIS — F32A Depression, unspecified: Secondary | ICD-10-CM | POA: Diagnosis not present

## 2022-05-14 DIAGNOSIS — Z471 Aftercare following joint replacement surgery: Secondary | ICD-10-CM | POA: Diagnosis not present

## 2022-05-14 DIAGNOSIS — Z96652 Presence of left artificial knee joint: Secondary | ICD-10-CM | POA: Diagnosis not present

## 2022-05-14 HISTORY — PX: KNEE ARTHROPLASTY: SHX992

## 2022-05-14 LAB — ABO/RH: ABO/RH(D): O POS

## 2022-05-14 SURGERY — ARTHROPLASTY, KNEE, TOTAL, USING IMAGELESS COMPUTER-ASSISTED NAVIGATION
Anesthesia: Spinal | Site: Knee | Laterality: Left

## 2022-05-14 MED ORDER — SENNOSIDES-DOCUSATE SODIUM 8.6-50 MG PO TABS
1.0000 | ORAL_TABLET | Freq: Two times a day (BID) | ORAL | Status: DC
  Administered 2022-05-14 – 2022-05-15 (×2): 1 via ORAL
  Filled 2022-05-14 (×2): qty 1

## 2022-05-14 MED ORDER — MECLIZINE HCL 25 MG PO TABS: 25.0000 mg | ORAL_TABLET | Freq: Three times a day (TID) | ORAL | Status: DC | PRN

## 2022-05-14 MED ORDER — PROPOFOL 1000 MG/100ML IV EMUL
INTRAVENOUS | Status: AC
  Filled 2022-05-14: qty 100

## 2022-05-14 MED ORDER — PHENOL 1.4 % MT LIQD: 1.0000 | OROMUCOSAL | Status: DC | PRN

## 2022-05-14 MED ORDER — OXYCODONE HCL 5 MG PO TABS: 10.0000 mg | ORAL_TABLET | ORAL | Status: DC | PRN

## 2022-05-14 MED ORDER — BUPIVACAINE HCL (PF) 0.25 % IJ SOLN
INTRAMUSCULAR | Status: AC
  Filled 2022-05-14: qty 120

## 2022-05-14 MED ORDER — MIDAZOLAM HCL 5 MG/5ML IJ SOLN
INTRAMUSCULAR | Status: DC | PRN
  Administered 2022-05-14: 1 mg via INTRAVENOUS

## 2022-05-14 MED ORDER — ENOXAPARIN SODIUM 30 MG/0.3ML IJ SOSY
30.0000 mg | PREFILLED_SYRINGE | Freq: Two times a day (BID) | INTRAMUSCULAR | Status: DC
  Administered 2022-05-15: 30 mg via SUBCUTANEOUS
  Filled 2022-05-14: qty 0.3

## 2022-05-14 MED ORDER — FENTANYL CITRATE (PF) 100 MCG/2ML IJ SOLN
INTRAMUSCULAR | Status: DC | PRN
  Administered 2022-05-14: 50 ug via INTRAVENOUS

## 2022-05-14 MED ORDER — TRANEXAMIC ACID-NACL 1000-0.7 MG/100ML-% IV SOLN
INTRAVENOUS | Status: AC
  Filled 2022-05-14: qty 100

## 2022-05-14 MED ORDER — CELECOXIB 200 MG PO CAPS
200.0000 mg | ORAL_CAPSULE | Freq: Two times a day (BID) | ORAL | Status: DC
  Administered 2022-05-14 – 2022-05-15 (×2): 200 mg via ORAL
  Filled 2022-05-14 (×2): qty 1

## 2022-05-14 MED ORDER — FLEET ENEMA 7-19 GM/118ML RE ENEM: 1.0000 | ENEMA | Freq: Once | RECTAL | Status: DC | PRN

## 2022-05-14 MED ORDER — FENTANYL CITRATE (PF) 100 MCG/2ML IJ SOLN
INTRAMUSCULAR | Status: AC
  Filled 2022-05-14: qty 2

## 2022-05-14 MED ORDER — SODIUM CHLORIDE 0.9 % IR SOLN
Status: DC | PRN
  Administered 2022-05-14: 3000 mL

## 2022-05-14 MED ORDER — ONDANSETRON HCL 4 MG/2ML IJ SOLN: 4.0000 mg | Freq: Four times a day (QID) | INTRAMUSCULAR | Status: DC | PRN

## 2022-05-14 MED ORDER — GABAPENTIN 300 MG PO CAPS
ORAL_CAPSULE | ORAL | Status: AC
  Administered 2022-05-14: 300 mg via ORAL
  Filled 2022-05-14: qty 1

## 2022-05-14 MED ORDER — TRAMADOL HCL 50 MG PO TABS
50.0000 mg | ORAL_TABLET | ORAL | Status: DC | PRN
  Administered 2022-05-14 – 2022-05-15 (×2): 50 mg via ORAL
  Filled 2022-05-14 (×2): qty 1

## 2022-05-14 MED ORDER — CELECOXIB 200 MG PO CAPS
ORAL_CAPSULE | ORAL | Status: AC
  Administered 2022-05-14: 400 mg via ORAL
  Filled 2022-05-14: qty 2

## 2022-05-14 MED ORDER — ONDANSETRON HCL 4 MG PO TABS: 4.0000 mg | ORAL_TABLET | Freq: Four times a day (QID) | ORAL | Status: DC | PRN

## 2022-05-14 MED ORDER — ACETAMINOPHEN 10 MG/ML IV SOLN
INTRAVENOUS | Status: DC | PRN
  Administered 2022-05-14: 1000 mg via INTRAVENOUS

## 2022-05-14 MED ORDER — HYDROMORPHONE HCL 1 MG/ML IJ SOLN: 0.5000 mg | INTRAMUSCULAR | Status: DC | PRN

## 2022-05-14 MED ORDER — PROPOFOL 500 MG/50ML IV EMUL
INTRAVENOUS | Status: DC | PRN
  Administered 2022-05-14: 25 ug/kg/min via INTRAVENOUS

## 2022-05-14 MED ORDER — BUPIVACAINE HCL (PF) 0.5 % IJ SOLN
INTRAMUSCULAR | Status: AC
  Filled 2022-05-14: qty 10

## 2022-05-14 MED ORDER — ACETAMINOPHEN 10 MG/ML IV SOLN
INTRAVENOUS | Status: AC
  Filled 2022-05-14: qty 100

## 2022-05-14 MED ORDER — MAGNESIUM HYDROXIDE 400 MG/5ML PO SUSP
30.0000 mL | Freq: Every day | ORAL | Status: DC
  Administered 2022-05-14 – 2022-05-15 (×2): 30 mL via ORAL
  Filled 2022-05-14 (×2): qty 30

## 2022-05-14 MED ORDER — TRANEXAMIC ACID-NACL 1000-0.7 MG/100ML-% IV SOLN
1000.0000 mg | Freq: Once | INTRAVENOUS | Status: AC
  Administered 2022-05-14: 1000 mg via INTRAVENOUS

## 2022-05-14 MED ORDER — PANTOPRAZOLE SODIUM 40 MG PO TBEC
40.0000 mg | DELAYED_RELEASE_TABLET | Freq: Two times a day (BID) | ORAL | Status: DC
  Administered 2022-05-14 – 2022-05-15 (×2): 40 mg via ORAL
  Filled 2022-05-14 (×2): qty 1

## 2022-05-14 MED ORDER — SODIUM CHLORIDE (PF) 0.9 % IJ SOLN
INTRAMUSCULAR | Status: DC | PRN
  Administered 2022-05-14: 120 mL via INTRAMUSCULAR

## 2022-05-14 MED ORDER — LIDOCAINE HCL (CARDIAC) PF 100 MG/5ML IV SOSY
PREFILLED_SYRINGE | INTRAVENOUS | Status: DC | PRN
  Administered 2022-05-14: 2 mL via INTRAVENOUS

## 2022-05-14 MED ORDER — DEXAMETHASONE SODIUM PHOSPHATE 10 MG/ML IJ SOLN
INTRAMUSCULAR | Status: AC
  Administered 2022-05-14: 8 mg via INTRAVENOUS
  Filled 2022-05-14: qty 1

## 2022-05-14 MED ORDER — PHENYLEPHRINE HCL-NACL 20-0.9 MG/250ML-% IV SOLN
INTRAVENOUS | Status: DC | PRN
  Administered 2022-05-14: 25 ug/min via INTRAVENOUS

## 2022-05-14 MED ORDER — ALUM & MAG HYDROXIDE-SIMETH 200-200-20 MG/5ML PO SUSP: 30.0000 mL | ORAL | Status: DC | PRN

## 2022-05-14 MED ORDER — ENSURE PRE-SURGERY PO LIQD
296.0000 mL | Freq: Once | ORAL | Status: AC
  Administered 2022-05-14: 296 mL via ORAL
  Filled 2022-05-14: qty 296

## 2022-05-14 MED ORDER — OXYCODONE HCL 5 MG PO TABS
5.0000 mg | ORAL_TABLET | ORAL | Status: DC | PRN
  Administered 2022-05-14 (×2): 5 mg via ORAL
  Filled 2022-05-14 (×2): qty 1

## 2022-05-14 MED ORDER — ACETAMINOPHEN 10 MG/ML IV SOLN
1000.0000 mg | Freq: Four times a day (QID) | INTRAVENOUS | Status: DC
  Administered 2022-05-14 – 2022-05-15 (×3): 1000 mg via INTRAVENOUS
  Filled 2022-05-14 (×3): qty 100

## 2022-05-14 MED ORDER — CEFAZOLIN SODIUM-DEXTROSE 2-4 GM/100ML-% IV SOLN
INTRAVENOUS | Status: AC
  Filled 2022-05-14: qty 100

## 2022-05-14 MED ORDER — GLYCOPYRROLATE 0.2 MG/ML IJ SOLN
INTRAMUSCULAR | Status: AC
  Filled 2022-05-14: qty 1

## 2022-05-14 MED ORDER — CHLORHEXIDINE GLUCONATE 0.12 % MT SOLN
OROMUCOSAL | Status: AC
  Administered 2022-05-14: 15 mL via OROMUCOSAL
  Filled 2022-05-14: qty 15

## 2022-05-14 MED ORDER — BUPIVACAINE LIPOSOME 1.3 % IJ SUSP
INTRAMUSCULAR | Status: AC
  Filled 2022-05-14: qty 40

## 2022-05-14 MED ORDER — BISACODYL 10 MG RE SUPP: 10.0000 mg | Freq: Every day | RECTAL | Status: DC | PRN

## 2022-05-14 MED ORDER — BUPIVACAINE HCL (PF) 0.5 % IJ SOLN
INTRAMUSCULAR | Status: DC | PRN
  Administered 2022-05-14: 2.6 mL

## 2022-05-14 MED ORDER — CEFAZOLIN SODIUM-DEXTROSE 2-4 GM/100ML-% IV SOLN
2.0000 g | Freq: Four times a day (QID) | INTRAVENOUS | Status: AC
  Administered 2022-05-14 (×2): 2 g via INTRAVENOUS
  Filled 2022-05-14 (×2): qty 100

## 2022-05-14 MED ORDER — DIPHENHYDRAMINE HCL 12.5 MG/5ML PO ELIX: 12.5000 mg | ORAL_SOLUTION | ORAL | Status: DC | PRN

## 2022-05-14 MED ORDER — PROPOFOL 10 MG/ML IV BOLUS
INTRAVENOUS | Status: AC
  Filled 2022-05-14: qty 20

## 2022-05-14 MED ORDER — SODIUM CHLORIDE 0.9 % IV SOLN: INTRAVENOUS | Status: DC

## 2022-05-14 MED ORDER — PHENYLEPHRINE HCL-NACL 20-0.9 MG/250ML-% IV SOLN
INTRAVENOUS | Status: AC
  Filled 2022-05-14: qty 250

## 2022-05-14 MED ORDER — METOCLOPRAMIDE HCL 5 MG PO TABS
10.0000 mg | ORAL_TABLET | Freq: Three times a day (TID) | ORAL | Status: DC
  Administered 2022-05-14 (×2): 10 mg via ORAL
  Filled 2022-05-14 (×3): qty 2

## 2022-05-14 MED ORDER — SURGIPHOR WOUND IRRIGATION SYSTEM - OPTIME: TOPICAL | Status: DC | PRN

## 2022-05-14 MED ORDER — MENTHOL 3 MG MT LOZG: 1.0000 | LOZENGE | OROMUCOSAL | Status: DC | PRN

## 2022-05-14 MED ORDER — OXYBUTYNIN CHLORIDE ER 5 MG PO TB24
5.0000 mg | ORAL_TABLET | Freq: Every day | ORAL | Status: DC
  Administered 2022-05-14: 5 mg via ORAL
  Filled 2022-05-14: qty 1

## 2022-05-14 MED ORDER — SODIUM CHLORIDE FLUSH 0.9 % IV SOLN
INTRAVENOUS | Status: AC
  Filled 2022-05-14: qty 80

## 2022-05-14 MED ORDER — MIDAZOLAM HCL 2 MG/2ML IJ SOLN
INTRAMUSCULAR | Status: AC
  Filled 2022-05-14: qty 2

## 2022-05-14 MED ORDER — ACETAMINOPHEN 325 MG PO TABS: 325.0000 mg | ORAL_TABLET | Freq: Four times a day (QID) | ORAL | Status: DC | PRN

## 2022-05-14 MED ORDER — FAMOTIDINE 20 MG PO TABS
ORAL_TABLET | ORAL | Status: AC
  Administered 2022-05-14: 20 mg via ORAL
  Filled 2022-05-14: qty 1

## 2022-05-14 SURGICAL SUPPLY — 72 items
ATTUNE PSFEM LTSZ5 NARCEM KNEE (Femur) IMPLANT
ATTUNE PSRP INSR SZ5 5 KNEE (Insert) IMPLANT
BASE TIBIAL ROT PLAT SZ 5 KNEE (Knees) IMPLANT
BATTERY INSTRU NAVIGATION (MISCELLANEOUS) ×4 IMPLANT
BLADE SAW 70X12.5 (BLADE) ×1 IMPLANT
BLADE SAW 90X13X1.19 OSCILLAT (BLADE) ×1 IMPLANT
BLADE SAW 90X25X1.19 OSCILLAT (BLADE) ×1 IMPLANT
BSPLAT TIB 5 CMNT ROT PLAT STR (Knees) ×1 IMPLANT
BTRY SRG DRVR LF (MISCELLANEOUS) ×4
CATH FOLEY SIL 2WAY 12FR5CC (CATHETERS) IMPLANT
CEMENT HV SMART SET (Cement) IMPLANT
COOLER POLAR GLACIER W/PUMP (MISCELLANEOUS) ×1 IMPLANT
CUFF TOURN SGL QUICK 24 (TOURNIQUET CUFF)
CUFF TOURN SGL QUICK 34 (TOURNIQUET CUFF)
CUFF TRNQT CYL 24X4X16.5-23 (TOURNIQUET CUFF) IMPLANT
CUFF TRNQT CYL 34X4.125X (TOURNIQUET CUFF) IMPLANT
DRAPE 3/4 80X56 (DRAPES) ×1 IMPLANT
DRAPE INCISE IOBAN 66X45 STRL (DRAPES) IMPLANT
DRSG MEPILEX SACRM 8.7X9.8 (GAUZE/BANDAGES/DRESSINGS) ×1 IMPLANT
DRSG NON-ADHERENT DERMACEA 3X4 (GAUZE/BANDAGES/DRESSINGS) ×1 IMPLANT
DRSG OPSITE POSTOP 4X14 (GAUZE/BANDAGES/DRESSINGS) ×1 IMPLANT
DRSG TEGADERM 4X4.75 (GAUZE/BANDAGES/DRESSINGS) ×1 IMPLANT
DURAPREP 26ML APPLICATOR (WOUND CARE) ×2 IMPLANT
ELECT CAUTERY BLADE 6.4 (BLADE) ×1 IMPLANT
ELECT REM PT RETURN 9FT ADLT (ELECTROSURGICAL) ×1
ELECTRODE REM PT RTRN 9FT ADLT (ELECTROSURGICAL) ×1 IMPLANT
EX-PIN ORTHOLOCK NAV 4X150 (PIN) ×2 IMPLANT
GLOVE BIOGEL M STRL SZ7.5 (GLOVE) ×2 IMPLANT
GLOVE SRG 8 PF TXTR STRL LF DI (GLOVE) ×1 IMPLANT
GLOVE SURG UNDER POLY LF SZ8 (GLOVE) ×1
GOWN STRL REUS W/ TWL LRG LVL3 (GOWN DISPOSABLE) ×1 IMPLANT
GOWN STRL REUS W/TWL LRG LVL3 (GOWN DISPOSABLE) ×1
GOWN TOGA ZIPPER T7+ PEEL AWAY (MISCELLANEOUS) ×1 IMPLANT
HANDLE YANKAUER SUCT OPEN TIP (MISCELLANEOUS) ×1 IMPLANT
HEMOVAC 400CC 10FR (MISCELLANEOUS) ×1 IMPLANT
HOLDER FOLEY CATH W/STRAP (MISCELLANEOUS) ×1 IMPLANT
HOOD PEEL AWAY T7 (MISCELLANEOUS) IMPLANT
IV NS IRRIG 3000ML ARTHROMATIC (IV SOLUTION) ×1 IMPLANT
KIT TURNOVER KIT A (KITS) ×1 IMPLANT
KNIFE SCULPS 14X20 (INSTRUMENTS) ×1 IMPLANT
MANIFOLD NEPTUNE II (INSTRUMENTS) ×2 IMPLANT
NDL SPNL 20GX3.5 QUINCKE YW (NEEDLE) ×2 IMPLANT
NEEDLE SPNL 20GX3.5 QUINCKE YW (NEEDLE) ×2 IMPLANT
NS IRRIG 500ML POUR BTL (IV SOLUTION) ×1 IMPLANT
PACK TOTAL KNEE (MISCELLANEOUS) ×1 IMPLANT
PAD ABD DERMACEA PRESS 5X9 (GAUZE/BANDAGES/DRESSINGS) ×2 IMPLANT
PAD WRAPON POLAR KNEE (MISCELLANEOUS) ×1 IMPLANT
PATELLA MEDIAL ATTUN 35MM KNEE (Knees) IMPLANT
PIN DRILL FIX HALF THREAD (BIT) ×2 IMPLANT
PIN FIXATION 1/8DIA X 3INL (PIN) ×1 IMPLANT
PULSAVAC PLUS IRRIG FAN TIP (DISPOSABLE) ×1
SOL PREP PVP 2OZ (MISCELLANEOUS) ×1
SOLUTION IRRIG SURGIPHOR (IV SOLUTION) ×1 IMPLANT
SOLUTION PREP PVP 2OZ (MISCELLANEOUS) ×1 IMPLANT
SPONGE DRAIN TRACH 4X4 STRL 2S (GAUZE/BANDAGES/DRESSINGS) ×1 IMPLANT
STAPLER SKIN PROX 35W (STAPLE) ×1 IMPLANT
STOCKINETTE IMPERV 14X48 (MISCELLANEOUS) ×1 IMPLANT
STRAP TIBIA SHORT (MISCELLANEOUS) ×1 IMPLANT
SUCTION FRAZIER HANDLE 10FR (MISCELLANEOUS) ×1
SUCTION TUBE FRAZIER 10FR DISP (MISCELLANEOUS) ×1 IMPLANT
SUT VIC AB 0 CT1 36 (SUTURE) ×1 IMPLANT
SUT VIC AB 1 CT1 36 (SUTURE) ×2 IMPLANT
SUT VIC AB 2-0 CT2 27 (SUTURE) ×1 IMPLANT
SYR 30ML LL (SYRINGE) ×2 IMPLANT
TIBIAL BASE ROT PLAT SZ 5 KNEE (Knees) ×1 IMPLANT
TIP FAN IRRIG PULSAVAC PLUS (DISPOSABLE) ×1 IMPLANT
TOWEL OR 17X26 4PK STRL BLUE (TOWEL DISPOSABLE) IMPLANT
TOWER CARTRIDGE SMART MIX (DISPOSABLE) ×1 IMPLANT
TRAP FLUID SMOKE EVACUATOR (MISCELLANEOUS) ×1 IMPLANT
TRAY FOLEY MTR SLVR 16FR STAT (SET/KITS/TRAYS/PACK) ×1 IMPLANT
WATER STERILE IRR 1000ML POUR (IV SOLUTION) ×1 IMPLANT
WRAPON POLAR PAD KNEE (MISCELLANEOUS) ×1

## 2022-05-14 NOTE — Interval H&P Note (Signed)
History and Physical Interval Note:  05/14/2022 6:04 AM  Julia Snyder  has presented today for surgery, with the diagnosis of PRIMARY OSTEOARTHRITIS OF LEFT KNEE..  The various methods of treatment have been discussed with the patient and family. After consideration of risks, benefits and other options for treatment, the patient has consented to  Procedure(s): COMPUTER ASSISTED TOTAL KNEE ARTHROPLASTY - RNFA (Left) as a surgical intervention.  The patient's history has been reviewed, patient examined, no change in status, stable for surgery.  I have reviewed the patient's chart and labs.  Questions were answered to the patient's satisfaction.     [place]

## 2022-05-14 NOTE — Anesthesia Procedure Notes (Signed)
Spinal  Patient location during procedure: OR Start time: 05/14/2022 8:08 AM End time: 05/14/2022 8:12 AM Reason for block: surgical anesthesia Staffing Performed: resident/CRNA  Resident/CRNA: Demetrius Charity, CRNA Performed by: Demetrius Charity, CRNA Authorized by: Molli Barrows, MD   Preanesthetic Checklist Completed: patient identified, IV checked, site marked, risks and benefits discussed, surgical consent, monitors and equipment checked, pre-op evaluation and timeout performed Spinal Block Patient position: sitting Prep: ChloraPrep Patient monitoring: heart rate, continuous pulse ox, blood pressure and cardiac monitor Approach: midline Location: L4-5 Injection technique: single-shot Needle Needle type: Whitacre and Introducer  Needle gauge: 25 G Needle length: 9 cm Assessment Sensory level: T10 Events: CSF return Additional Notes Sterile aseptic technique used throughout the procedure.  Negative paresthesia. Negative blood return. Positive free-flowing CSF. Expiration date of kit checked and confirmed. Patient tolerated procedure well, without complications.

## 2022-05-14 NOTE — Evaluation (Signed)
Physical Therapy Evaluation Patient Details Name: Julia Snyder MRN: [id number] DOB: [birth year] Today's Date: 05/14/2022  History of Present Illness  Patient is a 77 year old female with degenerative arthrosis of the left knee, s/p left total knee arthroplasty.  Clinical Impression  Patient is agreeable to PT. She is typically independent with mobility, drives, and lives with her spouse.  Today, she reports 3/10 pain in the left knee. Gait training initiated and patient ambulated 87f with Min guard assistance using rolling walker. Exercises initiated and education provided for positioning techniques to promote knee ROM. Patient is hopeful for discharge home tomorrow. PT will continue to follow to maximize independence and decrease caregiver burden.      Recommendations for follow up therapy are one component of a multi-disciplinary discharge planning process, led by the attending physician.  Recommendations may be updated based on patient status, additional functional criteria and insurance authorization.  Follow Up Recommendations Home health PT      Assistance Recommended at Discharge Set up Supervision/Assistance  Patient can return home with the following  Assist for transportation;Help with stairs or ramp for entrance    Equipment Recommendations Rolling walker (2 wheels);BSC/3in1  Recommendations for Other Services       Functional Status Assessment Patient has had a recent decline in their functional status and demonstrates the ability to make significant improvements in function in a reasonable and predictable amount of time.     Precautions / Restrictions Precautions Precautions: Knee;Fall Precaution Booklet Issued: Yes (comment) Restrictions Weight Bearing Restrictions: Yes LLE Weight Bearing: Weight bearing as tolerated      Mobility  Bed Mobility Overal bed mobility: Needs Assistance Bed Mobility: Supine to Sit, Sit to Supine     Supine to sit:  Supervision Sit to supine: Min assist   General bed mobility comments: assistance briefly with LLE to return to bed. cues for technique    Transfers Overall transfer level: Needs assistance Equipment used: Rolling walker (2 wheels) Transfers: Sit to/from Stand Sit to Stand: Min guard           General transfer comment: verbal cues for hand placement for safety    Ambulation/Gait Ambulation/Gait assistance: Min guard Gait Distance (Feet): 12 Feet Assistive device: Rolling walker (2 wheels) Gait Pattern/deviations: Step-to pattern, Decreased stance time - left Gait velocity: decreased     General Gait Details: cues for safety with ambualtion. no loss of balance and no knee buckling with activity  Stairs            Wheelchair Mobility    Modified Rankin (Stroke Patients Only)       Balance Overall balance assessment: Needs assistance Sitting-balance support: Feet supported Sitting balance-Leahy Scale: Good     Standing balance support: Single extremity supported, Reliant on assistive device for balance, Bilateral upper extremity supported Standing balance-Leahy Scale: Fair                               Pertinent Vitals/Pain Pain Assessment Pain Assessment: 0-10 Pain Score: 3  Pain Location: L knee Pain Descriptors / Indicators: Sore Pain Intervention(s): Limited activity within patient's tolerance, Monitored during session, RN gave pain meds during session, Repositioned (polar care reapplied)    Home Living Family/patient expects to be discharged to:: Private residence Living Arrangements: Spouse/significant other Available Help at Discharge: Family;Available 24 hours/day Type of Home: House Home Access: Stairs to enter   [employer] Steps: 1 and then  1   Home Layout: One level Home Equipment: Cane - single point      Prior Function Prior Level of Function : Independent/Modified Independent;Driving              Mobility Comments: no device       Hand Dominance        Extremity/Trunk Assessment   Upper Extremity Assessment Upper Extremity Assessment: Overall WFL for tasks assessed    Lower Extremity Assessment Lower Extremity Assessment: LLE deficits/detail LLE Deficits / Details: ankle AROM WFL. can SLR x 5 reps independently       Communication   Communication: No difficulties  Cognition Arousal/Alertness: Awake/alert Behavior During Therapy: WFL for tasks assessed/performed Overall Cognitive Status: Within Functional Limits for tasks assessed                                          General Comments      Exercises Total Joint Exercises Straight Leg Raises: AROM, Strengthening, Left, 5 reps, Supine Long Arc Quad: AROM, Strengthening, Left, 5 reps, Seated Goniometric ROM: L knee 5-45 degrees Other Exercises Other Exercises: HEP issued. cues for exercise technique. education on positioning techniques to promote knee ROM and to avoid placing pillow beind the knee, promoting knee extension   Assessment/Plan    PT Assessment Patient needs continued PT services  PT Problem List Decreased strength;Decreased range of motion;Decreased activity tolerance;Decreased balance;Decreased mobility;Decreased knowledge of precautions;Decreased safety awareness       PT Treatment Interventions DME instruction;Gait training;Stair training;Functional mobility training;Therapeutic activities;Therapeutic exercise;Balance training;Neuromuscular re-education;Patient/family education    PT Goals (Current goals can be found in the Care Plan section)  Acute Rehab PT Goals Patient Stated Goal: to go home PT Goal Formulation: With patient Time For Goal Achievement: 05/28/22 Potential to Achieve Goals: Good    Frequency BID     Co-evaluation               AM-PAC PT "6 Clicks" Mobility  Outcome Measure Help needed turning from your back to your side while in a flat  bed without using bedrails?: None Help needed moving from lying on your back to sitting on the side of a flat bed without using bedrails?: A Little Help needed moving to and from a bed to a chair (including a wheelchair)?: A Little Help needed standing up from a chair using your arms (e.g., wheelchair or bedside chair)?: A Little Help needed to walk in hospital room?: A Little Help needed climbing 3-5 steps with a railing? : A Little 6 Click Score: 19    End of Session Equipment Utilized During Treatment: Gait belt Activity Tolerance: Patient tolerated treatment well Patient left: in bed;with call bell/phone within reach;with family/visitor present;with nursing/sitter in room;with SCD's reapplied (SCD RLE, polar care LLE) Nurse Communication: Mobility status PT Visit Diagnosis: Other abnormalities of gait and mobility (R26.89);Difficulty in walking, not elsewhere classified (R26.2)    Time: [id number] PT Time Calculation (min) (ACUTE ONLY): 63 min   Charges:   PT Evaluation $PT Eval Low Complexity: 1 Low PT Treatments $Gait Training: 8-22 mins $Therapeutic Exercise: 8-22 mins        Minna Merritts, PT, MPT   Percell Locus 05/14/2022, 4:30 PM

## 2022-05-14 NOTE — Plan of Care (Signed)
  Problem: Education: Goal: Knowledge of the prescribed therapeutic regimen will improve Outcome: Progressing Goal: Individualized Educational Video(s) Outcome: Progressing   Problem: Activity: Goal: Ability to avoid complications of mobility impairment will improve Outcome: Progressing Goal: Range of joint motion will improve Outcome: Progressing   Problem: Clinical Measurements: Goal: Postoperative complications will be avoided or minimized Outcome: Progressing   Problem: Pain Management: Goal: Pain level will decrease with appropriate interventions Outcome: Progressing   Problem: Skin Integrity: Goal: Will show signs of wound healing Outcome: Progressing   Problem: Education: Goal: Knowledge of General Education information will improve Description: Including pain rating scale, medication(s)/side effects and non-pharmacologic comfort measures Outcome: Progressing   Problem: Health Behavior/Discharge Planning: Goal: Ability to manage health-related needs will improve Outcome: Progressing   Problem: Clinical Measurements: Goal: Ability to maintain clinical measurements within normal limits will improve Outcome: Progressing Goal: Will remain free from infection Outcome: Progressing Goal: Diagnostic test results will improve Outcome: Progressing Goal: Respiratory complications will improve Outcome: Progressing Goal: Cardiovascular complication will be avoided Outcome: Progressing   Problem: Activity: Goal: Risk for activity intolerance will decrease Outcome: Progressing   Problem: Nutrition: Goal: Adequate nutrition will be maintained Outcome: Progressing   Problem: Coping: Goal: Level of anxiety will decrease Outcome: Progressing   Problem: Elimination: Goal: Will not experience complications related to bowel motility Outcome: Progressing Goal: Will not experience complications related to urinary retention Outcome: Progressing   Problem: Pain  Managment: Goal: General experience of comfort will improve Outcome: Progressing   Problem: Safety: Goal: Ability to remain free from injury will improve Outcome: Progressing   Problem: Skin Integrity: Goal: Risk for impaired skin integrity will decrease Outcome: Progressing   

## 2022-05-14 NOTE — Anesthesia Procedure Notes (Signed)
Date/Time: 05/14/2022 8:23 AM  Performed by: Demetrius Charity, CRNAPre-anesthesia Checklist: Patient identified, Emergency Drugs available, Suction available, Patient being monitored and Timeout performed Patient Re-evaluated:Patient Re-evaluated prior to induction Oxygen Delivery Method: Nasal cannula Induction Type: IV induction Placement Confirmation: CO2 detector and positive ETCO2

## 2022-05-14 NOTE — Op Note (Signed)
OPERATIVE NOTE  DATE OF SURGERY:  05/14/2022  PATIENT NAME:  Julia Snyder   DOB: Sep 09, 1945  MRN: [id number]  PRE-OPERATIVE DIAGNOSIS: Degenerative arthrosis of the left knee, primary  POST-OPERATIVE DIAGNOSIS:  Same  PROCEDURE:  Left total knee arthroplasty using computer-assisted navigation  SURGEON:  Marciano Sequin. M.D.  ANESTHESIA: spinal  ESTIMATED BLOOD LOSS: 50 mL  FLUIDS REPLACED: 1000 mL of crystalloid  TOURNIQUET TIME: 72 minutes  DRAINS: 2 medium Hemovac drains  SOFT TISSUE RELEASES: Anterior cruciate ligament, posterior cruciate ligament, deep medial collateral ligament, patellofemoral ligament  IMPLANTS UTILIZED: DePuy Attune size 5N posterior stabilized femoral component (cemented), size 5 rotating platform tibial component (cemented), 35 mm medialized dome patella (cemented), and a 5 mm stabilized rotating platform polyethylene insert.  INDICATIONS FOR SURGERY: Julia Snyder is a 77 y.o. year old female with a long history of progressive knee pain. X-rays demonstrated severe degenerative changes in tricompartmental fashion. The patient had not seen any significant improvement despite conservative nonsurgical intervention. After discussion of the risks and benefits of surgical intervention, the patient expressed understanding of the risks benefits and agree with plans for total knee arthroplasty.   The risks, benefits, and alternatives were discussed at length including but not limited to the risks of infection, bleeding, nerve injury, stiffness, blood clots, the need for revision surgery, cardiopulmonary complications, among others, and they were willing to proceed.  PROCEDURE IN DETAIL: The patient was brought into the operating room and, after adequate spinal anesthesia was achieved, a tourniquet was placed on the patient's upper thigh. The patient's knee and leg were cleaned and prepped with alcohol and DuraPrep and draped in the usual sterile fashion. A  "timeout" was performed as per usual protocol. The lower extremity was exsanguinated using an Esmarch, and the tourniquet was inflated to 300 mmHg. An anterior longitudinal incision was made followed by a standard mid vastus approach. The deep fibers of the medial collateral ligament were elevated in a subperiosteal fashion off of the medial flare of the tibia so as to maintain a continuous soft tissue sleeve. The patella was subluxed laterally and the patellofemoral ligament was incised. Inspection of the knee demonstrated severe degenerative changes with full-thickness loss of articular cartilage. Osteophytes were debrided using a rongeur. Anterior and posterior cruciate ligaments were excised. Two 4.0 mm Schanz pins were inserted in the femur and into the tibia for attachment of the array of trackers used for computer-assisted navigation. Hip center was identified using a circumduction technique. Distal landmarks were mapped using the computer. The distal femur and proximal tibia were mapped using the computer. The distal femoral cutting guide was positioned using computer-assisted navigation so as to achieve a 5 distal valgus cut. The femur was sized and it was felt that a size 5N femoral component was appropriate. A size 5 femoral cutting guide was positioned and the anterior cut was performed and verified using the computer. This was followed by completion of the posterior and chamfer cuts. Femoral cutting guide for the central box was then positioned in the center box cut was performed.  Attention was then directed to the proximal tibia. Medial and lateral menisci were excised. The extramedullary tibial cutting guide was positioned using computer-assisted navigation so as to achieve a 0 varus-valgus alignment and 3 posterior slope. The cut was performed and verified using the computer. The proximal tibia was sized and it was felt that a size 5 tibial tray was appropriate. Tibial and femoral trials were  inserted followed  by insertion of a 5 mm polyethylene insert. This allowed for excellent mediolateral soft tissue balancing both in flexion and in full extension. Finally, the patella was cut and prepared so as to accommodate a 35 mm medialized dome patella. A patella trial was placed and the knee was placed through a range of motion with excellent patellar tracking appreciated. The femoral trial was removed after debridement of posterior osteophytes. The central post-hole for the tibial component was reamed followed by insertion of a keel punch. Tibial trials were then removed. Cut surfaces of bone were irrigated with copious amounts of normal saline using pulsatile lavage and then suctioned dry. Polymethylmethacrylate cement was prepared in the usual fashion using a vacuum mixer. Cement was applied to the cut surface of the proximal tibia as well as along the undersurface of a size 5 rotating platform tibial component. Tibial component was positioned and impacted into place. Excess cement was removed using [employer] fast streamer. Cement was then applied to the cut surfaces of the femur as well as along the posterior flanges of the size 5N femoral component. The femoral component was positioned and impacted into place. Excess cement was removed using [employer] fast streamer. A 5 mm polyethylene trial was inserted and the knee was brought into full extension with steady axial compression applied. Finally, cement was applied to the backside of a 35 mm medialized dome patella and the patellar component was positioned and patellar clamp applied. Excess cement was removed using [employer] fast streamer. After adequate curing of the cement, the tourniquet was deflated after a total tourniquet time of 72 minutes. Hemostasis was achieved using electrocautery. The knee was irrigated with copious amounts of normal saline using pulsatile lavage followed by 450 ml of Surgiphor and then suctioned dry. 20 mL of 1.3% Exparel and 60 mL of 0.25% Marcaine  in 40 mL of normal saline was injected along the posterior capsule, medial and lateral gutters, and along the arthrotomy site. A 5 mm stabilized rotating platform polyethylene insert was inserted and the knee was placed through a range of motion with excellent mediolateral soft tissue balancing appreciated and excellent patellar tracking noted. 2 medium drains were placed in the wound bed and brought out through separate stab incisions. The medial parapatellar portion of the incision was reapproximated using interrupted sutures of #1 Vicryl. Subcutaneous tissue was approximated in layers using first #0 Vicryl followed #2-0 Vicryl. The skin was approximated with skin staples. A sterile dressing was applied.  The patient tolerated the procedure well and was transported to the recovery room in stable condition.    Javel Hersh P. Holley Bouche., M.D.

## 2022-05-14 NOTE — Transfer of Care (Signed)
Immediate Anesthesia Transfer of Care Note  Patient: Julia Snyder  Procedure(s) Performed: COMPUTER ASSISTED TOTAL KNEE ARTHROPLASTY - RNFA (Left: Knee)  Patient Location: PACU  Anesthesia Type:Spinal  Level of Consciousness: awake, alert , and oriented  Airway & Oxygen Therapy: Patient Spontanous Breathing  Post-op Assessment: Report given to RN and Post -op Vital signs reviewed and stable  Post vital signs: Reviewed and stable  Last Vitals:  Vitals Value Taken Time  BP 119/61   Temp    Pulse 63   Resp 13   SpO2 100     Last Pain:  Vitals:   05/14/22 0631  TempSrc: Temporal  PainSc: 0-No pain         Complications: No notable events documented.

## 2022-05-15 DIAGNOSIS — M1712 Unilateral primary osteoarthritis, left knee: Secondary | ICD-10-CM | POA: Diagnosis not present

## 2022-05-15 MED ORDER — TRAMADOL HCL 50 MG PO TABS: 50.0000 mg | ORAL_TABLET | ORAL | 0 refills | Status: DC | PRN

## 2022-05-15 MED ORDER — ENOXAPARIN SODIUM 40 MG/0.4ML IJ SOSY: 40.0000 mg | PREFILLED_SYRINGE | INTRAMUSCULAR | 0 refills | Status: DC

## 2022-05-15 MED ORDER — OXYCODONE HCL 5 MG PO TABS: 5.0000 mg | ORAL_TABLET | ORAL | 0 refills | Status: DC | PRN

## 2022-05-15 MED ORDER — CELECOXIB 200 MG PO CAPS: 200.0000 mg | ORAL_CAPSULE | Freq: Two times a day (BID) | ORAL | 1 refills | Status: DC

## 2022-05-15 NOTE — Plan of Care (Signed)
Problem: Education: Goal: Knowledge of the prescribed therapeutic regimen will improve 05/15/2022 0945 by Alferd Apa, RN Outcome: Adequate for Discharge 05/15/2022 [phone] by Alferd Apa, RN Outcome: Progressing Goal: Individualized Educational Video(s) 05/15/2022 0945 by Alferd Apa, RN Outcome: Adequate for Discharge 05/15/2022 [phone] by Alferd Apa, RN Outcome: Progressing   Problem: Activity: Goal: Ability to avoid complications of mobility impairment will improve 05/15/2022 0945 by Alferd Apa, RN Outcome: Adequate for Discharge 05/15/2022 [phone] by Alferd Apa, RN Outcome: Progressing Goal: Range of joint motion will improve 05/15/2022 0945 by Alferd Apa, RN Outcome: Adequate for Discharge 05/15/2022 0808 by Alferd Apa, RN Outcome: Progressing   Problem: Clinical Measurements: Goal: Postoperative complications will be avoided or minimized 05/15/2022 0945 by Alferd Apa, RN Outcome: Adequate for Discharge 05/15/2022 0808 by Alferd Apa, RN Outcome: Progressing   Problem: Pain Management: Goal: Pain level will decrease with appropriate interventions 05/15/2022 0945 by Alferd Apa, RN Outcome: Adequate for Discharge 05/15/2022 0808 by Alferd Apa, RN Outcome: Progressing   Problem: Skin Integrity: Goal: Will show signs of wound healing 05/15/2022 0945 by Alferd Apa, RN Outcome: Adequate for Discharge 05/15/2022 0808 by Alferd Apa, RN Outcome: Progressing   Problem: Education: Goal: Knowledge of General Education information will improve Description: Including pain rating scale, medication(s)/side effects and non-pharmacologic comfort measures 05/15/2022 0945 by Alferd Apa, RN Outcome: Adequate for Discharge 05/15/2022 0808 by Alferd Apa, RN Outcome: Progressing   Problem: Health Behavior/Discharge Planning: Goal: Ability to manage health-related needs will improve 05/15/2022 0945 by Alferd Apa, RN Outcome: Adequate for Discharge 05/15/2022 0808 by Alferd Apa, RN Outcome: Progressing   Problem: Clinical Measurements: Goal: Ability to maintain clinical measurements within normal limits will improve 05/15/2022 0945 by Alferd Apa, RN Outcome: Adequate for Discharge 05/15/2022 0808 by Alferd Apa, RN Outcome: Progressing Goal: Will remain free from infection 05/15/2022 0945 by Alferd Apa, RN Outcome: Adequate for Discharge 05/15/2022 0808 by Alferd Apa, RN Outcome: Progressing Goal: Diagnostic test results will improve 05/15/2022 0945 by Alferd Apa, RN Outcome: Adequate for Discharge 05/15/2022 0808 by Alferd Apa, RN Outcome: Progressing Goal: Respiratory complications will improve 05/15/2022 0945 by Alferd Apa, RN Outcome: Adequate for Discharge 05/15/2022 0808 by Alferd Apa, RN Outcome: Progressing Goal: Cardiovascular complication will be avoided 05/15/2022 0945 by Alferd Apa, RN Outcome: Adequate for Discharge 05/15/2022 [phone] by Alferd Apa, RN Outcome: Progressing   Problem: Activity: Goal: Risk for activity intolerance will decrease 05/15/2022 0945 by Alferd Apa, RN Outcome: Adequate for Discharge 05/15/2022 [phone] by Alferd Apa, RN Outcome: Progressing   Problem: Nutrition: Goal: Adequate nutrition will be maintained 05/15/2022 0945 by Alferd Apa, RN Outcome: Adequate for Discharge 05/15/2022 [phone] by Alferd Apa, RN Outcome: Progressing   Problem: Coping: Goal: Level of anxiety will decrease 05/15/2022 0945 by Alferd Apa, RN Outcome: Adequate for Discharge 05/15/2022 0808 by Alferd Apa, RN Outcome: Progressing   Problem: Elimination: Goal: Will not experience complications related to bowel motility 05/15/2022 0945 by Alferd Apa, RN Outcome: Adequate for Discharge 05/15/2022 0808 by Alferd Apa, RN Outcome: Progressing Goal: Will not experience complications related to urinary retention 05/15/2022 0945 by Alferd Apa, RN Outcome: Adequate for Discharge 05/15/2022 0808 by Alferd Apa,  RN Outcome: Progressing   Problem: Pain Managment: Goal: General experience of comfort will improve 05/15/2022 0945 by  Rayen Palen, Alfredo Batty, RN Outcome: Adequate for Discharge 05/15/2022 [id number] by Alferd Apa, RN Outcome: Progressing   Problem: Safety: Goal: Ability to remain free from injury will improve 05/15/2022 0945 by Alferd Apa, RN Outcome: Adequate for Discharge 05/15/2022 0808 by Alferd Apa, RN Outcome: Progressing   Problem: Skin Integrity: Goal: Risk for impaired skin integrity will decrease 05/15/2022 0945 by Alferd Apa, RN Outcome: Adequate for Discharge 05/15/2022 [phone] by Alferd Apa, RN Outcome: Progressing

## 2022-05-15 NOTE — Progress Notes (Signed)
  Subjective: 1 Day Post-Op Procedure(s) (LRB): COMPUTER ASSISTED TOTAL KNEE ARTHROPLASTY - RNFA (Left) Patient reports pain as mild.   Patient is well, and has had no acute complaints or problems Plan is to go Home after hospital stay. Negative for chest pain and shortness of breath Fever: no Gastrointestinal: Negative for nausea and vomiting  Objective: Vital signs in last 24 hours: Temp:  [97.8 F (36.6 C)-98 F (36.7 C)] 97.8 F (36.6 C) (03/02 0345) Pulse Rate:  [52-68] 52 (03/02 0345) Resp:  [10-26] 16 (03/02 0345) BP: (119-141)/(44-84) 133/53 (03/02 0345) SpO2:  [95 %-100 %] 98 % (03/02 0345)  Intake/Output from previous day:  Intake/Output Summary (Last 24 hours) at 05/15/2022 0730 Last data filed at 05/15/2022 0525 Gross per 24 hour  Intake 2871.22 ml  Output 2410 ml  Net 461.22 ml    Intake/Output this shift: No intake/output data recorded.  Labs: No results for input(s): "HGB" in the last 72 hours. No results for input(s): "WBC", "RBC", "HCT", "PLT" in the last 72 hours. No results for input(s): "NA", "K", "CL", "CO2", "BUN", "CREATININE", "GLUCOSE", "CALCIUM" in the last 72 hours. No results for input(s): "LABPT", "INR" in the last 72 hours.   EXAM General - Patient is Alert and Oriented Extremity - Neurovascular intact Sensation intact distally Dorsiflexion/Plantar flexion intact Compartment soft Dressing/Incision - clean, dry, with the Hemovac tubing removed with no complication.  The Hemovac tubing was intact on removal. Motor Function - intact, moving foot and toes well on exam.  Able to straight leg raise independently.  Past Medical History:  Diagnosis Date   Arthritis    Depression    Overactive bladder    Urge incontinence of urine     Assessment/Plan: 1 Day Post-Op Procedure(s) (LRB): COMPUTER ASSISTED TOTAL KNEE ARTHROPLASTY - RNFA (Left) Principal Problem:   Total knee replacement status  Estimated body mass index is 25.09 kg/m as  calculated from the following:   Height as of this encounter: 5' 1.5" (1.562 m).   Weight as of this encounter: 61.2 kg. Advance diet Up with therapy D/C IV fluids Discharge home with home health  DVT Prophylaxis - Lovenox, Foot Pumps, and TED hose Weight-Bearing as tolerated to left leg  Reche Dixon, PA-C Orthopaedic Surgery 05/15/2022, 7:30 AM

## 2022-05-15 NOTE — Evaluation (Signed)
Occupational Therapy Evaluation Patient Details Name: KHADIDJA RUDIE MRN: [id number] DOB: 02-Dec-1945 Today's Date: 05/15/2022   History of Present Illness Patient is a 77 y.o. female who was admitted for a left total knee arthroplasty 2/2 degenerative arthrosis of the left knee.   Clinical Impression   Pt. presents with 3/10 left knee pain, limited ROM, limited functional mobility, and impulsivity which limits her ability to complete basic ADL and IADL functioning at her PLOF. Pt. resides at home with her husband. Pt. was independent with ADLs, and IADL functioning: including meal preparation-although husband cooks most meals, and medication management. Pt. was able to drive. Pt. Is independent with UE dressing. Pt. Is independently able to donn and doff the left sock, and reach during LE dressing tasks. Pt.requires Supervision with functional mobility for ADLs/IADL 2/2 impulsivity. Pt. Is eager to resume driving, and had multiple questions about driving. Pt. was advised to wait for clearance from her physician before resuming driving. Pt.'s Husband reports plans are in place for him to assist the Pt. as needed at home. Pt. And husband report he will be cooking meals for the Pt. Pt. will benefit from OT services for ADL training, A/E training, and pt. Education about home modification, and DME while at the acute care level. No follow-up OT services are recommended after d/c at this time.      Recommendations for follow up therapy are one component of a multi-disciplinary discharge planning process, led by the attending physician.  Recommendations may be updated based on patient status, additional functional criteria and insurance authorization.   Follow Up Recommendations  No OT follow up     Assistance Recommended at Discharge    Patient can return home with the following A little help with walking and/or transfers;A little help with bathing/dressing/bathroom;Assistance with cooking/housework     Functional Status Assessment  Patient has had a recent decline in their functional status and demonstrates the ability to make significant improvements in function in a reasonable and predictable amount of time.  Equipment Recommendations       Recommendations for Other Services       Precautions / Restrictions Precautions Precautions: Knee;Fall Restrictions Weight Bearing Restrictions: Yes LLE Weight Bearing: Weight bearing as tolerated      Mobility Bed Mobility Overal bed mobility: Independent         Sit to supine: Independent        Transfers Overall transfer level: Modified independent   Transfers: Sit to/from Stand Sit to Stand: Independent                  Balance Overall balance assessment: Needs assistance   Sitting balance-Leahy Scale: Good       Standing balance-Leahy Scale: Good                             ADL either performed or assessed with clinical judgement   ADL Overall ADL's : Needs assistance/impaired Eating/Feeding: Independent   Grooming: Independent           Upper Body Dressing : Independent   Lower Body Dressing: Supervision/safety               Functional mobility during ADLs: Supervision/safety       Vision Baseline Vision/History: 1 Wears glasses Patient Visual Report: No change from baseline       Perception     Praxis      Pertinent Vitals/Pain Pain Assessment Pain  Assessment: 0-10 Pain Score: 3  Pain Location: L knee Pain Descriptors / Indicators: Sore, Discomfort, Grimacing Pain Intervention(s): Limited activity within patient's tolerance     Hand Dominance Right   Extremity/Trunk Assessment Upper Extremity Assessment Upper Extremity Assessment: Overall WFL for tasks assessed           Communication Communication Communication: No difficulties   Cognition Arousal/Alertness: Awake/alert Behavior During Therapy: WFL for tasks assessed/performed Overall Cognitive  Status: Within Functional Limits for tasks assessed                                 General Comments: Impulsive     General Comments       Exercises     Shoulder Instructions      Home Living Family/patient expects to be discharged to:: Private residence Living Arrangements: Spouse/significant other Available Help at Discharge: Family;Available 24 hours/day Type of Home: House Home Access: Stairs to enter [employer] of Steps: 1   Home Layout: One level               Home Equipment: Cane - single point          Prior Functioning/Environment Prior Level of Function : Independent/Modified Independent;Driving               ADLs Comments: Independent with ADLs, and IADLs.        OT Problem List: Decreased strength      OT Treatment/Interventions: Self-care/ADL training;Therapeutic exercise;DME and/or AE instruction;Therapeutic activities;Patient/family education    OT Goals(Current goals can be found in the care plan section) Acute Rehab OT Goals Patient Stated Goal: To resume activity OT Goal Formulation: With patient Time For Goal Achievement: 05/29/22 Potential to Achieve Goals: Good  OT Frequency: Min 2X/week    Co-evaluation              AM-PAC OT "6 Clicks" Daily Activity     Outcome Measure Help from another person eating meals?: None Help from another person taking care of personal grooming?: None Help from another person toileting, which includes using toliet, bedpan, or urinal?: A Little Help from another person bathing (including washing, rinsing, drying)?: A Little Help from another person to put on and taking off regular upper body clothing?: None Help from another person to put on and taking off regular lower body clothing?: None 6 Click Score: 22   End of Session Equipment Utilized During Treatment: Gait belt  Activity Tolerance: Patient tolerated treatment well Patient left: in bed;with call  bell/phone within reach;with bed alarm set;with family/visitor present  OT Visit Diagnosis: Unsteadiness on feet (R26.81)                Time: [id number] OT Time Calculation (min): 28 min Charges:  OT General Charges $OT Visit: 1 Visit OT Evaluation $OT Eval Moderate Complexity: 1 Mod  Harrel Carina, MS, OTR/L   Harrel Carina 05/15/2022, 10:48 AM

## 2022-05-15 NOTE — TOC CM/SW Note (Signed)
Patient is not able to walk the distance required to go the bathroom, or she is unable to safely negotiate stairs required to access the bathroom.  A BSC will alleviate this problem

## 2022-05-15 NOTE — Anesthesia Postprocedure Evaluation (Signed)
Anesthesia Post Note  Patient: Julia Snyder  Procedure(s) Performed: COMPUTER ASSISTED TOTAL KNEE ARTHROPLASTY - RNFA (Left: Knee)  Patient location during evaluation: Nursing Unit Anesthesia Type: Spinal Level of consciousness: oriented and awake and alert Pain management: pain level controlled Vital Signs Assessment: post-procedure vital signs reviewed and stable Respiratory status: spontaneous breathing and respiratory function stable Cardiovascular status: blood pressure returned to baseline and stable Postop Assessment: no headache, no backache, no apparent nausea or vomiting and patient able to bend at knees Anesthetic complications: no   No notable events documented.   Last Vitals:  Vitals:   05/15/22 0919 05/15/22 0932  BP: (!) 120/59 125/69  Pulse: 60 89  Resp: 16 16  Temp: 36.6 C 36.7 C  SpO2:  96%    Last Pain:  Vitals:   05/15/22 0823  TempSrc:   PainSc: 3                  Ilene Qua

## 2022-05-15 NOTE — Plan of Care (Signed)
  Problem: Education: Goal: Knowledge of the prescribed therapeutic regimen will improve Outcome: Progressing Goal: Individualized Educational Video(s) Outcome: Progressing   Problem: Activity: Goal: Ability to avoid complications of mobility impairment will improve Outcome: Progressing Goal: Range of joint motion will improve Outcome: Progressing   Problem: Clinical Measurements: Goal: Postoperative complications will be avoided or minimized Outcome: Progressing   Problem: Pain Management: Goal: Pain level will decrease with appropriate interventions Outcome: Progressing   Problem: Skin Integrity: Goal: Will show signs of wound healing Outcome: Progressing   Problem: Education: Goal: Knowledge of General Education information will improve Description: Including pain rating scale, medication(s)/side effects and non-pharmacologic comfort measures Outcome: Progressing   Problem: Health Behavior/Discharge Planning: Goal: Ability to manage health-related needs will improve Outcome: Progressing   Problem: Clinical Measurements: Goal: Ability to maintain clinical measurements within normal limits will improve Outcome: Progressing Goal: Will remain free from infection Outcome: Progressing Goal: Diagnostic test results will improve Outcome: Progressing Goal: Respiratory complications will improve Outcome: Progressing Goal: Cardiovascular complication will be avoided Outcome: Progressing   Problem: Activity: Goal: Risk for activity intolerance will decrease Outcome: Progressing   Problem: Nutrition: Goal: Adequate nutrition will be maintained Outcome: Progressing   Problem: Coping: Goal: Level of anxiety will decrease Outcome: Progressing   Problem: Elimination: Goal: Will not experience complications related to bowel motility Outcome: Progressing Goal: Will not experience complications related to urinary retention Outcome: Progressing   Problem: Pain  Managment: Goal: General experience of comfort will improve Outcome: Progressing   Problem: Safety: Goal: Ability to remain free from injury will improve Outcome: Progressing   Problem: Skin Integrity: Goal: Risk for impaired skin integrity will decrease Outcome: Progressing   

## 2022-05-15 NOTE — TOC Initial Note (Signed)
Transition of Care [facility]) - Initial/Assessment Note    Patient Details  Name: Julia Snyder MRN: [id number] Date of Birth: 1945/10/30  Transition of Care [facility]) CM/SW Contact:    Rebekah Chesterfield, LCSW Phone Number: 05/15/2022, 9:32 AM  Clinical Narrative:                 CSW met with pt and spouse at bedside. Introduced self and PT recommendations for [facility], RW, and home health PT. Pt resides with spouse, PCP is Docia Chuck. Spouse provides transportation to medical appts and plans to provide at discharge. Pt is agreeable to [facility] and has no preference. Referral previously accepted with [facility] for PT. CSW informed [place] of d/c at 9:42 AM. [employer] will deliver DME to pt room prior to discharge   Expected Discharge Plan: [place] Barriers to Discharge: Barriers Resolved   Patient Goals and CMS Choice            Expected Discharge Plan and Services       Living arrangements for the past 2 months: [place] Expected Discharge Date: 05/15/22               DME Arranged: 3-N-1, Walker rolling DME Agency: [employer] Date DME Agency Contacted: 05/15/22 Time DME Agency Contacted: [phone] Representative spoke with at DME Agency: [place]: PT [place]: [place] ([place]) Date [place]: 05/15/22 Time [place]: [phone] Representative spoke with at [place]: Corene Cornea  Prior Living Arrangements/Services Living arrangements for the past 2 months: [place] Lives with:: Spouse Patient language and need for interpreter reviewed:: Yes Do you feel safe going back to the place where you live?: Yes      Need for Family Participation in Patient Care: Yes (Comment) Care giver support system in place?: Yes (comment)   Criminal Activity/Legal Involvement Pertinent to Current Situation/Hospitalization: No - Comment as needed  Activities of Daily Living Home Assistive Devices/Equipment:  Eyeglasses ADL Screening (condition at time of admission) Patient's cognitive ability adequate to safely complete daily activities?: Yes Is the patient deaf or have difficulty hearing?: No Does the patient have difficulty seeing, even when wearing glasses/contacts?: Yes Does the patient have difficulty concentrating, remembering, or making decisions?: Yes Patient able to express need for assistance with ADLs?: Yes Does the patient have difficulty dressing or bathing?: No Independently performs ADLs?: Yes (appropriate for developmental age) Does the patient have difficulty walking or climbing stairs?: Yes Weakness of Legs: None Weakness of Arms/Hands: None  Permission Sought/Granted                  Emotional Assessment Appearance:: Developmentally appropriate, Appears stated age Attitude/Demeanor/Rapport: Engaged Affect (typically observed): Appropriate, Pleasant Orientation: : Oriented to Self, Oriented to Place, Oriented to  Time, Oriented to Situation Alcohol / Substance Use: Not Applicable Psych Involvement: No (comment)  Admission diagnosis:  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] Patient Active Problem List   Diagnosis Date Noted   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   Osteoarthritis of left knee 03/16/2022   MCI (mild cognitive impairment) 12/28/2021   Mood disorder ([place]) 02/26/2019   Vertigo 04/11/2018   Preventative health care 02/22/2018   Advance directive discussed with patient 02/22/2018   Urge incontinence of urine    Dyslipidemia 02/25/2017   Increased frequency of urination 02/25/2017   Hallux valgus, acquired 09/10/2016   Closed fracture of lower end of humerus 11/10/2011   Pain in elbow 06/08/2011  PCP:  Venia Carbon, MD Pharmacy:   [facility] [place], [place] [address] Phone: [phone] Fax: [phone]     Social  Determinants of Health (SDOH) Social History: [place]: No Food Insecurity (05/14/2022)  Housing: Low Risk  (05/14/2022)  Transportation Needs: No Transportation Needs (05/14/2022)  Utilities: Not At Risk (05/14/2022)  Depression (PHQ2-9): Low Risk  (03/16/2022)  Tobacco Use: Low Risk  (05/14/2022)   SDOH Interventions:     Readmission Risk Interventions     No data to display

## 2022-05-15 NOTE — Discharge Summary (Signed)
Physician Discharge Summary  Subjective: 1 Day Post-Op Procedure(s) (LRB): COMPUTER ASSISTED TOTAL KNEE ARTHROPLASTY - RNFA (Left) Patient reports pain as mild.   Patient seen in rounds with Dr. Posey Pronto. Patient is well, and has had no acute complaints or problems Patient is ready to go home with home health physical therapy  Physician Discharge Summary  Patient ID: Julia Snyder MRN: [id number] DOB/AGE: 77-Feb-1947 77 y.o.  Admit date: 05/14/2022 Discharge date: 05/15/2022  Admission Diagnoses:  Discharge Diagnoses:  Principal Problem:   Total knee replacement status   Discharged Condition: good  Hospital Course: Patient is postop day 1 from a left total knee arthroplasty.  She is doing well with pain control.  Her vitals have remained stable.  Her Hemovac was removed this morning.  The patient will do physical therapy this morning before being discharged home.  She did physical therapy yesterday and ambulated 12 feet.  Treatments: surgery:   Left total knee arthroplasty using computer-assisted navigation   SURGEON:  Marciano Sequin. M.D.   ANESTHESIA: spinal   ESTIMATED BLOOD LOSS: 50 mL   FLUIDS REPLACED: 1000 mL of crystalloid   TOURNIQUET TIME: 72 minutes   DRAINS: 2 medium Hemovac drains   SOFT TISSUE RELEASES: Anterior cruciate ligament, posterior cruciate ligament, deep medial collateral ligament, patellofemoral ligament   IMPLANTS UTILIZED: DePuy Attune size 5N posterior stabilized femoral component (cemented), size 5 rotating platform tibial component (cemented), 35 mm medialized dome patella (cemented), and a 5 mm stabilized rotating platform polyethylene insert.  Discharge Exam: Blood pressure (!) 133/53, pulse (!) 52, temperature 97.8 F (36.6 C), resp. rate 16, height 5' 1.5" (1.562 m), weight 61.2 kg, SpO2 98 %.   Disposition: Discharge disposition: 01-Home or Self Care        Allergies as of 05/15/2022   No Known Allergies      Medication  List     TAKE these medications    B-12 PO Take 2,000 mcg by mouth daily.   CALCIUM 1200 PO Take 1 tablet by mouth daily.   celecoxib 200 MG capsule Commonly known as: CELEBREX Take 1 capsule (200 mg total) by mouth 2 (two) times daily.   Cholecalciferol 25 MCG (1000 UT) tablet Take 1 tablet by mouth. 125 mcg   enoxaparin 40 MG/0.4ML injection Commonly known as: LOVENOX Inject 0.4 mLs (40 mg total) into the skin daily for 14 days.   Fish Oil 1000 MG Caps Take by mouth daily.   meclizine 25 MG tablet Commonly known as: ANTIVERT Take 1 tablet (25 mg total) by mouth 3 (three) times daily as needed for dizziness.   MULTIPLE VITAMINS/WOMENS PO Take 1 tablet by mouth daily.   oxybutynin 5 MG 24 hr tablet Commonly known as: DITROPAN-XL TAKE 1 TABLET(5 MG) BY MOUTH DAILY   oxyCODONE 5 MG immediate release tablet Commonly known as: Oxy IR/ROXICODONE Take 1 tablet (5 mg total) by mouth every 4 (four) hours as needed for moderate pain (pain score 4-6).   traMADol 50 MG tablet Commonly known as: ULTRAM Take 1-2 tablets (50-100 mg total) by mouth every 4 (four) hours as needed for moderate pain.   vitamin C 100 MG tablet Take 1,000 mg by mouth daily.   vitamin k 100 MCG tablet Take 100 mcg by mouth daily.               Durable Medical Equipment  (From admission, onward)           Start  Ordered   05/14/22 1524  DME Walker rolling  Once       Question:  Patient needs a walker to treat with the following condition  Answer:  Total knee replacement status   05/14/22 1523   05/14/22 1524  DME Bedside commode  Once       Comments: Patient is not able to walk the distance required to go the bathroom, or he/she is unable to safely negotiate stairs required to access the bathroom.  A 3in1 BSC will alleviate this problem  Question:  Patient needs a bedside commode to treat with the following condition  Answer:  Total knee replacement status   05/14/22 1523             Follow-up Information     Watt Climes, PA Follow up on 05/31/2022.   Specialty: Physician Assistant Why: at 1:15pm Contact information: [place] [address] [phone]         Dereck Leep, MD Follow up on 06/29/2022.   Specialty: Orthopedic Surgery Why: at 9:45am Contact information: [place] [address] [phone]                 Signed: Prescott Parma, Zniya Cottone 05/15/2022, 7:34 AM   Objective: Vital signs in last 24 hours: Temp:  [97.8 F (36.6 C)-98 F (36.7 C)] 97.8 F (36.6 C) (03/02 0345) Pulse Rate:  [52-68] 52 (03/02 0345) Resp:  [10-26] 16 (03/02 0345) BP: (119-141)/(44-84) 133/53 (03/02 0345) SpO2:  [95 %-100 %] 98 % (03/02 0345)  Intake/Output from previous day:  Intake/Output Summary (Last 24 hours) at 05/15/2022 0734 Last data filed at 05/15/2022 0525 Gross per 24 hour  Intake 2871.22 ml  Output 2410 ml  Net 461.22 ml    Intake/Output this shift: No intake/output data recorded.  Labs: No results for input(s): "HGB" in the last 72 hours. No results for input(s): "WBC", "RBC", "HCT", "PLT" in the last 72 hours. No results for input(s): "NA", "K", "CL", "CO2", "BUN", "CREATININE", "GLUCOSE", "CALCIUM" in the last 72 hours. No results for input(s): "LABPT", "INR" in the last 72 hours.  EXAM: General - Patient is Alert and Oriented Extremity - Neurovascular intact Sensation intact distally Dorsiflexion/Plantar flexion intact Compartment soft Incision - clean, dry, with the Hemovac tubing removed with no complication Motor Function -plantarflexion and dorsiflexion are intact.  Able to straight leg raise independently.  Assessment/Plan: 1 Day Post-Op Procedure(s) (LRB): COMPUTER ASSISTED TOTAL KNEE ARTHROPLASTY - RNFA (Left) Procedure(s) (LRB): COMPUTER ASSISTED TOTAL KNEE ARTHROPLASTY - RNFA (Left) Past Medical History:  Diagnosis Date   Arthritis    Depression     Overactive bladder    Urge incontinence of urine    Principal Problem:   Total knee replacement status  Estimated body mass index is 25.09 kg/m as calculated from the following:   Height as of this encounter: 5' 1.5" (1.562 m).   Weight as of this encounter: 61.2 kg. Advance diet Up with therapy D/C IV fluids Discharge home with home health Diet - Regular diet Follow up - in 2 weeks Activity - WBAT Disposition - Home Condition Upon Discharge - Stable DVT Prophylaxis - Lovenox and TED hose  Reche Dixon, PA-C Orthopaedic Surgery 05/15/2022, 7:34 AM

## 2022-05-15 NOTE — Progress Notes (Signed)
Physical Therapy Treatment Patient Details Name: Julia Snyder MRN: [id number] DOB: [birth year] Today's Date: 05/15/2022   History of Present Illness Patient is a 77 year old female with degenerative arthrosis of the left knee, s/p left total knee arthroplasty.    PT Comments    Pt seen for PT tx with pt agreeable, spouse Julia Snyder) present for session. Pt presents with slightly decreased memory & impulsivity requiring cuing throughout session. Pt is able to complete bed mobility with mod I, STS with mod I, & ambulation with RW & supervision. Educated pt & [name] on stair negotiation with B rails as well as single step negotiation with RW. Pt & Julia Snyder return demonstrated ability to negotiate single step with RW with Julia Snyder providing assistance with recalling compensatory technique.  Briefly reviewed HEP & all questions answered to pt & spouse satisfaction. At this time, pt is cleared from PT standpoint to d/c home with HHPT f/u - team notified.    Recommendations for follow up therapy are one component of a multi-disciplinary discharge planning process, led by the attending physician.  Recommendations may be updated based on patient status, additional functional criteria and insurance authorization.  Follow Up Recommendations  Home health PT     Assistance Recommended at Discharge Intermittent Supervision/Assistance  Patient can return home with the following Assist for transportation;Help with stairs or ramp for entrance   Equipment Recommendations  Rolling walker (2 wheels);BSC/3in1    Recommendations for Other Services       Precautions / Restrictions Precautions Precautions: Knee;Fall Restrictions Weight Bearing Restrictions: Yes LLE Weight Bearing: Weight bearing as tolerated     Mobility  Bed Mobility Overal bed mobility: Modified Independent Bed Mobility: Supine to Sit, Sit to Supine     Supine to sit: +2 for safety/equipment Sit to supine: Modified independent (Device/Increase  time)        Transfers Overall transfer level: Modified independent Equipment used: Rolling walker (2 wheels) Transfers: Sit to/from Stand Sit to Stand: Modified independent (Device/Increase time)           General transfer comment: STS from EOB & chair, min cuing for safe hand placement    Ambulation/Gait Ambulation/Gait assistance: Supervision Gait Distance (Feet): 150 Feet (+ 150 ft) Assistive device: Rolling walker (2 wheels) Gait Pattern/deviations: Decreased stride length, Decreased stance time - left Gait velocity: slightly decreased         Stairs Stairs: Yes Stairs assistance: Min guard Stair Management: Two rails, Step to pattern Number of Stairs: 4 General stair comments: Pt negotiates 4 steps with B rails & CGA with educational cuing re: compensatory pattern, after PT provides demonstration. Pt then negotiates single step 4 times with PT then with husband's assistance with RW & CGA; PT/spouse provide min cuing for compensatory pattern.   Wheelchair Mobility    Modified Rankin (Stroke Patients Only)       Balance Overall balance assessment: Needs assistance Sitting-balance support: Feet supported Sitting balance-Leahy Scale: Good     Standing balance support: During functional activity, Bilateral upper extremity supported, Reliant on assistive device for balance Standing balance-Leahy Scale: Good                              Cognition Arousal/Alertness: Awake/alert Behavior During Therapy: WFL for tasks assessed/performed Overall Cognitive Status: Within Functional Limits for tasks assessed  General Comments: Pt appears to have some memory issues (states, "did I have therapy yesterday?"), some confusion & forgetfulness throughout session, as well as impulsivity requiring min cuing for safety (educated pt's husband on pt's need for cuing)        Exercises Other Exercises Other  Exercises: Briefly reviewed HEP exercises with pt & spouse, as well as need to promote L knee extension, use of polar care, use of gait belt PRN. Other Exercises: Verbally educated & provided demonstration re: car transfer technique. Other Exercises: Educated pt on need to ambulate every hour at home upon d/c.    General Comments        Pertinent Vitals/Pain Pain Assessment Pain Assessment: 0-10 Pain Score: 3  Pain Location: L knee Pain Descriptors / Indicators: Sore, Discomfort, Grimacing Pain Intervention(s): Monitored during session, Premedicated before session    Home Living                          Prior Function            PT Goals (current goals can now be found in the care plan section) Acute Rehab PT Goals Patient Stated Goal: to go home PT Goal Formulation: With patient Time For Goal Achievement: 05/28/22 Potential to Achieve Goals: Good Progress towards PT goals: Progressing toward goals    Frequency    BID      PT Plan Current plan remains appropriate    Co-evaluation              AM-PAC PT "6 Clicks" Mobility   Outcome Measure  Help needed turning from your back to your side while in a flat bed without using bedrails?: None Help needed moving from lying on your back to sitting on the side of a flat bed without using bedrails?: None Help needed moving to and from a bed to a chair (including a wheelchair)?: A Little Help needed standing up from a chair using your arms (e.g., wheelchair or bedside chair)?: None Help needed to walk in hospital room?: A Little Help needed climbing 3-5 steps with a railing? : A Little 6 Click Score: 21    End of Session   Activity Tolerance: Patient tolerated treatment well Patient left: in bed;with call bell/phone within reach;with bed alarm set;with family/visitor present Nurse Communication: Mobility status PT Visit Diagnosis: Other abnormalities of gait and mobility (R26.89);Difficulty in walking,  not elsewhere classified (R26.2);Pain;Muscle weakness (generalized) (M62.81) Pain - Right/Left: Left Pain - part of body: Knee     Time: [id number] PT Time Calculation (min) (ACUTE ONLY): 28 min  Charges:  $Gait Training: 8-22 mins $Therapeutic Activity: 8-22 mins                     Lavone Nian, PT, DPT 05/15/22, 8:26 AM   Waunita Schooner 05/15/2022, 8:24 AM

## 2022-05-15 NOTE — TOC Initial Note (Signed)
Transition of Care [facility]) - Initial/Assessment Note    Patient Details  Name: Julia Snyder MRN: [id number] Date of Birth: [birth year]  [facility] CM/SW Contact:    Rebekah [place], [place] Phone Number: 05/15/2022, 9:44 AM  Clinical Narrative:                   Expected Discharge Plan: [place] Barriers to Discharge: Barriers Resolved   Patient Goals and CMS Choice            Expected Discharge Plan and Services       Living arrangements for the past 2 months: [place] Expected Discharge Date: 05/15/22               DME Arranged: 3-N-1, [place] Rile rolling DME Agency: [employer] Date DME Agency Contacted: 05/15/22 Time DME Agency Contacted: [phone] Representative spoke with at DME Agency: [facility]: PT [place]: [place] Date [place]: 05/15/22 Time [place]: [phone] Representative spoke with at [place]: [place]  Prior Living Arrangements/Services Living arrangements for the past 2 months: [place] Lives with:: Spouse Patient language and need for interpreter reviewed:: Yes Do you feel safe going back to the place where you live?: Yes      Need for Family Participation in Patient Care: Yes (Comment) Care giver support system in place?: Yes (comment)   Criminal Activity/Legal Involvement Pertinent to Current Situation/Hospitalization: No - Comment as needed  Activities of Daily Living Home Assistive Devices/Equipment: Eyeglasses ADL Screening (condition at time of admission) Patient's cognitive ability adequate to safely complete daily activities?: Yes Is the patient deaf or have difficulty hearing?: No Does the patient have difficulty seeing, even when wearing glasses/contacts?: Yes Does the patient have difficulty concentrating, remembering, or making decisions?: Yes Patient able to express need for assistance with ADLs?: Yes Does the patient have difficulty  dressing or bathing?: No Independently performs ADLs?: Yes (appropriate for developmental age) Does the patient have difficulty walking or climbing stairs?: Yes Weakness of Legs: None Weakness of Arms/Hands: None  Permission Sought/Granted                  Emotional Assessment Appearance:: Developmentally appropriate, Appears stated age Attitude/Demeanor/Rapport: Engaged Affect (typically observed): Appropriate, Pleasant Orientation: : Oriented to Self, Oriented to Place, Oriented to  Time, Oriented to Situation Alcohol / Substance Use: Not Applicable Psych Involvement: No (comment)  Admission diagnosis:  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] Patient Active Problem List   Diagnosis Date Noted   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   Osteoarthritis of left knee 03/16/2022   MCI (mild cognitive impairment) 12/28/2021   Mood disorder ([place]) 02/26/2019   Vertigo 04/11/2018   Preventative health care 02/22/2018   Advance directive discussed with patient 02/22/2018   Urge incontinence of urine    Dyslipidemia 02/25/2017   Increased frequency of urination 02/25/2017   Hallux valgus, acquired 09/10/2016   Closed fracture of lower end of humerus 11/10/2011   Pain in elbow 06/08/2011   PCP:  Venia Carbon, MD Pharmacy:   [facility] [place], [place] - [address] [place] [address] Phone: [phone] Fax: [phone]     Social Determinants of Health (SDOH) Social History: SDOH Screenings   Food Insecurity: No Food Insecurity (05/14/2022)  Housing: Low Risk  (05/14/2022)  Transportation Needs: No Transportation Needs (05/14/2022)  Utilities:  Not At Risk (05/14/2022)  Depression (PHQ2-9): Low Risk  (03/16/2022)  Tobacco Use: Low Risk  (05/14/2022)   SDOH Interventions:     Readmission Risk Interventions     No data to display

## 2022-05-16 DIAGNOSIS — Z791 Long term (current) use of non-steroidal anti-inflammatories (NSAID): Secondary | ICD-10-CM | POA: Diagnosis not present

## 2022-05-16 DIAGNOSIS — N3281 Overactive bladder: Secondary | ICD-10-CM | POA: Diagnosis not present

## 2022-05-16 DIAGNOSIS — Z7901 Long term (current) use of anticoagulants: Secondary | ICD-10-CM | POA: Diagnosis not present

## 2022-05-16 DIAGNOSIS — F32A Depression, unspecified: Secondary | ICD-10-CM | POA: Diagnosis not present

## 2022-05-16 DIAGNOSIS — Z9181 History of falling: Secondary | ICD-10-CM | POA: Diagnosis not present

## 2022-05-16 DIAGNOSIS — Z96652 Presence of left artificial knee joint: Secondary | ICD-10-CM | POA: Diagnosis not present

## 2022-05-16 DIAGNOSIS — Z471 Aftercare following joint replacement surgery: Secondary | ICD-10-CM | POA: Diagnosis not present

## 2022-05-16 DIAGNOSIS — M1990 Unspecified osteoarthritis, unspecified site: Secondary | ICD-10-CM | POA: Diagnosis not present

## 2022-05-17 ENCOUNTER — Encounter: Payer: Self-pay | Admitting: Orthopedic Surgery

## 2022-05-17 ENCOUNTER — Telehealth: Payer: Self-pay

## 2022-05-17 NOTE — Anesthesia Preprocedure Evaluation (Signed)
Anesthesia Evaluation  Patient identified by MRN, date of birth, ID band Patient awake    Reviewed: Allergy & Precautions, H&P , NPO status , Patient's Chart, lab work & pertinent test results, reviewed documented beta blocker date and time   Airway Mallampati: II   Neck ROM: full    Dental  (+) Poor Dentition   Pulmonary neg pulmonary ROS   Pulmonary exam normal        Cardiovascular Exercise Tolerance: Good negative cardio ROS Normal cardiovascular exam Rhythm:regular Rate:Normal     Neuro/Psych    Depression    negative neurological ROS  negative psych ROS   GI/Hepatic negative GI ROS, Neg liver ROS,,,  Endo/Other  negative endocrine ROS    Renal/GU negative Renal ROS  negative genitourinary   Musculoskeletal   Abdominal   Peds  Hematology negative hematology ROS (+)   Anesthesia Other Findings Past Medical History: No date: Arthritis No date: Depression No date: Overactive bladder No date: Urge incontinence of urine Past Surgical History: 2004: BREAST BIOPSY; Left     Comment:  benign No date: BUNIONECTOMY; Bilateral     Comment:  left ~2014, right 2018 No date: COLONOSCOPY No date: ELBOW FRACTURE SURGERY; Bilateral     Comment:  right 2019, left ~2015 No date: FRACTURE SURGERY 12/01/2017: ORIF ELBOW FRACTURE; Right     Comment:  Procedure: OPEN REDUCTION INTERNAL FIXATION (ORIF)               ELBOW/OLECRANON FRACTURE;  Surgeon: Hessie Knows, MD;                Location: [facility] ORS;  Service: Orthopedics;  Laterality:               Right; 2015: OTHER SURGICAL HISTORY     Comment:  left arm surgery with titanium plate BMI    Body Mass Index: 25.09 kg/m     Reproductive/Obstetrics negative OB ROS                             Anesthesia Physical Anesthesia Plan  ASA: 3  Anesthesia Plan: Spinal   Post-op Pain Management:    Induction:   PONV Risk Score and Plan:    Airway Management Planned:   Additional Equipment:   Intra-op Plan:   Post-operative Plan:   Informed Consent: I have reviewed the patients History and Physical, chart, labs and discussed the procedure including the risks, benefits and alternatives for the proposed anesthesia with the patient or authorized representative who has indicated his/her understanding and acceptance.     Dental Advisory Given  Plan Discussed with: CRNA  Anesthesia Plan Comments:        Anesthesia Quick Evaluation

## 2022-05-17 NOTE — Transitions of Care (Post Inpatient/ED Visit) (Signed)
   05/17/2022  Name: Julia Snyder MRN: [id number] DOB: 1945-08-26  Today's TOC FU Call Status: Today's TOC FU Call Status:: Successful TOC FU Call Competed TOC FU Call Complete Date: 05/17/22  Transition Care Management Follow-up Telephone Call Date of Discharge: 05/15/22 Discharge Facility: [facility] Type of Discharge: Inpatient Admission Primary Inpatient Discharge Diagnosis:: Left Knee Osteoarthritis How have you been since you were released from the hospital?: Better Any questions or concerns?: No Patient notes she has not needed any Oxycodone to manage pain.   Items Reviewed: Did you receive and understand the discharge instructions provided?: Yes Medications obtained and verified?: Yes (Medications Reviewed) Any new allergies since your discharge?: No Dietary orders reviewed?: NA Do you have support at home?: Yes People in Home: spouse Name of Support/Comfort Primary Source: Cassandria Santee, husband very supportive  Home Care and Equipment/Supplies: [place] Ordered?: Yes Name of [place]:: [place] set up a time to come to your home?: Yes [place] Visit Date: 05/16/22 Any new equipment or medical supplies ordered?: Yes Name of Medical supply agency?: [employer] Were you able to get the equipment/medical supplies?: Yes Do you have any questions related to the use of the equipment/supplies?: No  Functional Questionnaire: Do you need assistance with meal preparation?: Yes Do you need assistance with eating?: No Do you have difficulty maintaining continence: Yes (Hx of urge incontine) Do you need assistance with getting out of bed/getting out of a chair/moving?: Yes Do you have difficulty managing or taking your medications?: No  Folllow up appointments reviewed: PCP Follow-up appointment confirmed?: [facility] Follow-up appointment confirmed?: Yes Date of Specialist follow-up appointment?:  05/31/22 Follow-Up Specialty Provider:: Vance Peper, PA, Orthopaedics Do you need transportation to your follow-up appointment?: No Do you understand care options if your condition(s) worsen?: Yes-patient verbalized understanding  SDOH Interventions Today    Flowsheet Row Most Recent Value  SDOH Interventions   Food Insecurity Interventions Intervention Not Indicated  Housing Interventions Intervention Not Indicated  Transportation Interventions Intervention Not Indicated      Johnney Killian, RN, BSN, CCM Care Management Coordinator [facility] Phone: [phone]: [phone]

## 2022-05-18 DIAGNOSIS — F32A Depression, unspecified: Secondary | ICD-10-CM | POA: Diagnosis not present

## 2022-05-18 DIAGNOSIS — N3281 Overactive bladder: Secondary | ICD-10-CM | POA: Diagnosis not present

## 2022-05-18 DIAGNOSIS — Z96652 Presence of left artificial knee joint: Secondary | ICD-10-CM | POA: Diagnosis not present

## 2022-05-18 DIAGNOSIS — M1990 Unspecified osteoarthritis, unspecified site: Secondary | ICD-10-CM | POA: Diagnosis not present

## 2022-05-18 DIAGNOSIS — Z471 Aftercare following joint replacement surgery: Secondary | ICD-10-CM | POA: Diagnosis not present

## 2022-05-18 DIAGNOSIS — Z791 Long term (current) use of non-steroidal anti-inflammatories (NSAID): Secondary | ICD-10-CM | POA: Diagnosis not present

## 2022-05-19 ENCOUNTER — Telehealth: Payer: Self-pay | Admitting: *Deleted

## 2022-05-19 NOTE — Transitions of Care (Post Inpatient/ED Visit) (Signed)
   05/19/2022  Name: Julia Snyder MRN: [id number] DOB: 01/23/1946  Today's TOC FU Call Status: EMMI red alert follow up call from [place] FU Call Status:: Successful TOC FU Call Competed TOC FU Call Complete Date: 05/19/22  Transition Care Management Follow-up Telephone Call Date of Discharge: 05/15/22 Discharge Facility: [facility]) Type of Discharge: Inpatient Admission ([place] red alert follow up) Primary Inpatient Discharge Diagnosis:: Lt knee arthritis How have you been since you were released from the hospital?: Better Any questions or concerns?: No  Items Reviewed: Did you receive and understand the discharge instructions provided?: Yes Medications obtained and verified?: Yes (Medications Reviewed) Any new allergies since your discharge?: No Dietary orders reviewed?: NA Do you have support at home?: Yes People in Home: spouse Name of Support/Comfort Primary Source: [facility] and Equipment/Supplies: [place] Ordered?: Yes Name of [place]:: [place] Visit Date: 05/16/22 Any new equipment or medical supplies ordered?: Yes Name of Medical supply agency?: [employer] Were you able to get the equipment/medical supplies?: Yes Do you have any questions related to the use of the equipment/supplies?: No  Functional Questionnaire: Do you need assistance with bathing/showering or dressing?: No Do you need assistance with meal preparation?: Yes Do you need assistance with eating?: No Do you have difficulty maintaining continence: No Do you need assistance with getting out of bed/getting out of a chair/moving?: Yes Do you have difficulty managing or taking your medications?: No  Folllow up appointments reviewed: PCP Follow-up appointment confirmed?: [facility] Follow-up appointment confirmed?: Yes Date of Specialist follow-up appointment?: 05/31/22 Follow-Up Specialty Provider:: Vance Peper PA Do you need transportation to your follow-up appointment?: No Do you understand care options if your condition(s) worsen?: Yes-patient verbalized understanding   Interventions Today    Flowsheet Row Most Recent Value  [place] Discussed  [Patient stated she does feel a little sad because she can't do the things she did due to the healing process. She understands it will take time. She said she is ok.]        Page Management [phone]

## 2022-05-20 DIAGNOSIS — Z96652 Presence of left artificial knee joint: Secondary | ICD-10-CM | POA: Diagnosis not present

## 2022-05-20 DIAGNOSIS — F32A Depression, unspecified: Secondary | ICD-10-CM | POA: Diagnosis not present

## 2022-05-20 DIAGNOSIS — M1990 Unspecified osteoarthritis, unspecified site: Secondary | ICD-10-CM | POA: Diagnosis not present

## 2022-05-20 DIAGNOSIS — Z471 Aftercare following joint replacement surgery: Secondary | ICD-10-CM | POA: Diagnosis not present

## 2022-05-20 DIAGNOSIS — N3281 Overactive bladder: Secondary | ICD-10-CM | POA: Diagnosis not present

## 2022-05-20 DIAGNOSIS — Z791 Long term (current) use of non-steroidal anti-inflammatories (NSAID): Secondary | ICD-10-CM | POA: Diagnosis not present

## 2022-05-21 DIAGNOSIS — F32A Depression, unspecified: Secondary | ICD-10-CM | POA: Diagnosis not present

## 2022-05-21 DIAGNOSIS — N3281 Overactive bladder: Secondary | ICD-10-CM | POA: Diagnosis not present

## 2022-05-21 DIAGNOSIS — Z471 Aftercare following joint replacement surgery: Secondary | ICD-10-CM | POA: Diagnosis not present

## 2022-05-21 DIAGNOSIS — Z96652 Presence of left artificial knee joint: Secondary | ICD-10-CM | POA: Diagnosis not present

## 2022-05-21 DIAGNOSIS — Z791 Long term (current) use of non-steroidal anti-inflammatories (NSAID): Secondary | ICD-10-CM | POA: Diagnosis not present

## 2022-05-21 DIAGNOSIS — M1990 Unspecified osteoarthritis, unspecified site: Secondary | ICD-10-CM | POA: Diagnosis not present

## 2022-05-24 DIAGNOSIS — Z791 Long term (current) use of non-steroidal anti-inflammatories (NSAID): Secondary | ICD-10-CM | POA: Diagnosis not present

## 2022-05-24 DIAGNOSIS — Z471 Aftercare following joint replacement surgery: Secondary | ICD-10-CM | POA: Diagnosis not present

## 2022-05-24 DIAGNOSIS — Z96652 Presence of left artificial knee joint: Secondary | ICD-10-CM | POA: Diagnosis not present

## 2022-05-24 DIAGNOSIS — M1990 Unspecified osteoarthritis, unspecified site: Secondary | ICD-10-CM | POA: Diagnosis not present

## 2022-05-24 DIAGNOSIS — F32A Depression, unspecified: Secondary | ICD-10-CM | POA: Diagnosis not present

## 2022-05-24 DIAGNOSIS — N3281 Overactive bladder: Secondary | ICD-10-CM | POA: Diagnosis not present

## 2022-05-26 DIAGNOSIS — N3281 Overactive bladder: Secondary | ICD-10-CM | POA: Diagnosis not present

## 2022-05-26 DIAGNOSIS — M1990 Unspecified osteoarthritis, unspecified site: Secondary | ICD-10-CM | POA: Diagnosis not present

## 2022-05-26 DIAGNOSIS — Z791 Long term (current) use of non-steroidal anti-inflammatories (NSAID): Secondary | ICD-10-CM | POA: Diagnosis not present

## 2022-05-26 DIAGNOSIS — F32A Depression, unspecified: Secondary | ICD-10-CM | POA: Diagnosis not present

## 2022-05-26 DIAGNOSIS — Z471 Aftercare following joint replacement surgery: Secondary | ICD-10-CM | POA: Diagnosis not present

## 2022-05-26 DIAGNOSIS — Z96652 Presence of left artificial knee joint: Secondary | ICD-10-CM | POA: Diagnosis not present

## 2022-05-28 DIAGNOSIS — F32A Depression, unspecified: Secondary | ICD-10-CM | POA: Diagnosis not present

## 2022-05-28 DIAGNOSIS — N3281 Overactive bladder: Secondary | ICD-10-CM | POA: Diagnosis not present

## 2022-05-28 DIAGNOSIS — M1990 Unspecified osteoarthritis, unspecified site: Secondary | ICD-10-CM | POA: Diagnosis not present

## 2022-05-28 DIAGNOSIS — Z791 Long term (current) use of non-steroidal anti-inflammatories (NSAID): Secondary | ICD-10-CM | POA: Diagnosis not present

## 2022-05-28 DIAGNOSIS — Z471 Aftercare following joint replacement surgery: Secondary | ICD-10-CM | POA: Diagnosis not present

## 2022-05-28 DIAGNOSIS — Z96652 Presence of left artificial knee joint: Secondary | ICD-10-CM | POA: Diagnosis not present

## 2022-05-31 DIAGNOSIS — Z96652 Presence of left artificial knee joint: Secondary | ICD-10-CM | POA: Diagnosis not present

## 2022-05-31 DIAGNOSIS — M25562 Pain in left knee: Secondary | ICD-10-CM | POA: Diagnosis not present

## 2022-06-02 DIAGNOSIS — Z96652 Presence of left artificial knee joint: Secondary | ICD-10-CM | POA: Diagnosis not present

## 2022-06-02 DIAGNOSIS — M25562 Pain in left knee: Secondary | ICD-10-CM | POA: Diagnosis not present

## 2022-06-07 DIAGNOSIS — Z96652 Presence of left artificial knee joint: Secondary | ICD-10-CM | POA: Diagnosis not present

## 2022-06-10 DIAGNOSIS — M25562 Pain in left knee: Secondary | ICD-10-CM | POA: Diagnosis not present

## 2022-06-10 DIAGNOSIS — Z96652 Presence of left artificial knee joint: Secondary | ICD-10-CM | POA: Diagnosis not present

## 2022-06-14 DIAGNOSIS — M25562 Pain in left knee: Secondary | ICD-10-CM | POA: Diagnosis not present

## 2022-06-14 DIAGNOSIS — Z96652 Presence of left artificial knee joint: Secondary | ICD-10-CM | POA: Diagnosis not present

## 2022-06-16 DIAGNOSIS — Z96652 Presence of left artificial knee joint: Secondary | ICD-10-CM | POA: Diagnosis not present

## 2022-06-16 DIAGNOSIS — M25562 Pain in left knee: Secondary | ICD-10-CM | POA: Diagnosis not present

## 2022-06-21 DIAGNOSIS — M25562 Pain in left knee: Secondary | ICD-10-CM | POA: Diagnosis not present

## 2022-06-21 DIAGNOSIS — Z96652 Presence of left artificial knee joint: Secondary | ICD-10-CM | POA: Diagnosis not present

## 2022-06-22 DIAGNOSIS — Z23 Encounter for immunization: Secondary | ICD-10-CM | POA: Diagnosis not present

## 2022-06-23 DIAGNOSIS — M25562 Pain in left knee: Secondary | ICD-10-CM | POA: Diagnosis not present

## 2022-06-23 DIAGNOSIS — Z96652 Presence of left artificial knee joint: Secondary | ICD-10-CM | POA: Diagnosis not present

## 2022-06-28 DIAGNOSIS — Z96652 Presence of left artificial knee joint: Secondary | ICD-10-CM | POA: Diagnosis not present

## 2022-06-28 DIAGNOSIS — M25562 Pain in left knee: Secondary | ICD-10-CM | POA: Diagnosis not present

## 2022-06-29 DIAGNOSIS — Z96652 Presence of left artificial knee joint: Secondary | ICD-10-CM | POA: Diagnosis not present

## 2022-06-30 DIAGNOSIS — M25562 Pain in left knee: Secondary | ICD-10-CM | POA: Diagnosis not present

## 2022-07-07 DIAGNOSIS — Z96652 Presence of left artificial knee joint: Secondary | ICD-10-CM | POA: Diagnosis not present

## 2022-07-07 DIAGNOSIS — M6281 Muscle weakness (generalized): Secondary | ICD-10-CM | POA: Diagnosis not present

## 2022-07-07 DIAGNOSIS — R2689 Other abnormalities of gait and mobility: Secondary | ICD-10-CM | POA: Diagnosis not present

## 2022-07-09 DIAGNOSIS — M6281 Muscle weakness (generalized): Secondary | ICD-10-CM | POA: Diagnosis not present

## 2022-07-09 DIAGNOSIS — Z96652 Presence of left artificial knee joint: Secondary | ICD-10-CM | POA: Diagnosis not present

## 2022-07-09 DIAGNOSIS — R2689 Other abnormalities of gait and mobility: Secondary | ICD-10-CM | POA: Diagnosis not present

## 2022-07-12 DIAGNOSIS — Z96652 Presence of left artificial knee joint: Secondary | ICD-10-CM | POA: Diagnosis not present

## 2022-07-12 DIAGNOSIS — R2689 Other abnormalities of gait and mobility: Secondary | ICD-10-CM | POA: Diagnosis not present

## 2022-07-12 DIAGNOSIS — M6281 Muscle weakness (generalized): Secondary | ICD-10-CM | POA: Diagnosis not present

## 2022-07-14 DIAGNOSIS — Z471 Aftercare following joint replacement surgery: Secondary | ICD-10-CM | POA: Diagnosis not present

## 2022-07-19 DIAGNOSIS — R2689 Other abnormalities of gait and mobility: Secondary | ICD-10-CM | POA: Diagnosis not present

## 2022-07-19 DIAGNOSIS — Z96652 Presence of left artificial knee joint: Secondary | ICD-10-CM | POA: Diagnosis not present

## 2022-07-19 DIAGNOSIS — M6281 Muscle weakness (generalized): Secondary | ICD-10-CM | POA: Diagnosis not present

## 2022-07-21 DIAGNOSIS — R2689 Other abnormalities of gait and mobility: Secondary | ICD-10-CM | POA: Diagnosis not present

## 2022-07-21 DIAGNOSIS — Z96652 Presence of left artificial knee joint: Secondary | ICD-10-CM | POA: Diagnosis not present

## 2022-07-21 DIAGNOSIS — M6281 Muscle weakness (generalized): Secondary | ICD-10-CM | POA: Diagnosis not present

## 2022-07-23 DIAGNOSIS — R2689 Other abnormalities of gait and mobility: Secondary | ICD-10-CM | POA: Diagnosis not present

## 2022-07-23 DIAGNOSIS — Z96652 Presence of left artificial knee joint: Secondary | ICD-10-CM | POA: Diagnosis not present

## 2022-07-23 DIAGNOSIS — M6281 Muscle weakness (generalized): Secondary | ICD-10-CM | POA: Diagnosis not present

## 2022-07-26 DIAGNOSIS — Z96652 Presence of left artificial knee joint: Secondary | ICD-10-CM | POA: Diagnosis not present

## 2022-07-26 DIAGNOSIS — R2689 Other abnormalities of gait and mobility: Secondary | ICD-10-CM | POA: Diagnosis not present

## 2022-07-26 DIAGNOSIS — M6281 Muscle weakness (generalized): Secondary | ICD-10-CM | POA: Diagnosis not present

## 2022-07-28 DIAGNOSIS — Z96652 Presence of left artificial knee joint: Secondary | ICD-10-CM | POA: Diagnosis not present

## 2022-07-28 DIAGNOSIS — M6281 Muscle weakness (generalized): Secondary | ICD-10-CM | POA: Diagnosis not present

## 2022-07-28 DIAGNOSIS — R2689 Other abnormalities of gait and mobility: Secondary | ICD-10-CM | POA: Diagnosis not present

## 2022-07-30 DIAGNOSIS — R2689 Other abnormalities of gait and mobility: Secondary | ICD-10-CM | POA: Diagnosis not present

## 2022-07-30 DIAGNOSIS — M6281 Muscle weakness (generalized): Secondary | ICD-10-CM | POA: Diagnosis not present

## 2022-07-30 DIAGNOSIS — Z96652 Presence of left artificial knee joint: Secondary | ICD-10-CM | POA: Diagnosis not present

## 2022-08-02 DIAGNOSIS — M6281 Muscle weakness (generalized): Secondary | ICD-10-CM | POA: Diagnosis not present

## 2022-08-02 DIAGNOSIS — R2689 Other abnormalities of gait and mobility: Secondary | ICD-10-CM | POA: Diagnosis not present

## 2022-08-02 DIAGNOSIS — Z96652 Presence of left artificial knee joint: Secondary | ICD-10-CM | POA: Diagnosis not present

## 2022-08-04 DIAGNOSIS — Z96652 Presence of left artificial knee joint: Secondary | ICD-10-CM | POA: Diagnosis not present

## 2022-08-04 DIAGNOSIS — R2689 Other abnormalities of gait and mobility: Secondary | ICD-10-CM | POA: Diagnosis not present

## 2022-08-04 DIAGNOSIS — M6281 Muscle weakness (generalized): Secondary | ICD-10-CM | POA: Diagnosis not present

## 2022-08-06 DIAGNOSIS — R2689 Other abnormalities of gait and mobility: Secondary | ICD-10-CM | POA: Diagnosis not present

## 2022-08-06 DIAGNOSIS — M6281 Muscle weakness (generalized): Secondary | ICD-10-CM | POA: Diagnosis not present

## 2022-08-06 DIAGNOSIS — Z96652 Presence of left artificial knee joint: Secondary | ICD-10-CM | POA: Diagnosis not present

## 2022-08-18 DIAGNOSIS — M6281 Muscle weakness (generalized): Secondary | ICD-10-CM | POA: Diagnosis not present

## 2022-08-18 DIAGNOSIS — Z96652 Presence of left artificial knee joint: Secondary | ICD-10-CM | POA: Diagnosis not present

## 2022-08-18 DIAGNOSIS — R2689 Other abnormalities of gait and mobility: Secondary | ICD-10-CM | POA: Diagnosis not present

## 2022-08-19 ENCOUNTER — Telehealth: Payer: Self-pay | Admitting: Internal Medicine

## 2022-08-19 NOTE — Telephone Encounter (Signed)
Started earlier today. Unable to check blood pressure. Will see if RN at [facility] can check.When asked if she had a headache or nausea she said "not really". I asked does not really mean not at all or yes. She did not really answer. I advised her that without knowing her BP, we cannot really guide her as to whether there is something she could take. I advised that she needed to be evaluated. She felt like it was not to that point. She said she could wait and see Dr Alphonsus Sias next week sometime. I advised I did not feel like she should wait. She said she was going to see if the nurse could check her BP and she would go from there to decide what she will do. I told her if she starts to feel worse, she needed to go somewhere to be evaluated.

## 2022-08-19 NOTE — Telephone Encounter (Signed)
Patient contacted the office requesting advice. States she has been having dizzy spells, says she had some in the past where Dr. Alphonsus Sias had prescribed a medication for her to take. Says she thinks this may be due to over exerting herself over the past few weeks. Wanted to know if there is anything Dr. Alphonsus Sias may suggest for her to take or do? Please advise, thank you.

## 2022-08-20 NOTE — Telephone Encounter (Signed)
Spoke to pt. She said she will go to the clinic. Thinks it has something to do with her travels and surgery and was worn out. She does not want meclizine right now. Said she will call back if she does not go anywhere else to be seen.

## 2022-08-30 DIAGNOSIS — R2689 Other abnormalities of gait and mobility: Secondary | ICD-10-CM | POA: Diagnosis not present

## 2022-08-30 DIAGNOSIS — M6281 Muscle weakness (generalized): Secondary | ICD-10-CM | POA: Diagnosis not present

## 2022-08-30 DIAGNOSIS — Z96652 Presence of left artificial knee joint: Secondary | ICD-10-CM | POA: Diagnosis not present

## 2022-09-01 DIAGNOSIS — R2689 Other abnormalities of gait and mobility: Secondary | ICD-10-CM | POA: Diagnosis not present

## 2022-09-01 DIAGNOSIS — M6281 Muscle weakness (generalized): Secondary | ICD-10-CM | POA: Diagnosis not present

## 2022-09-01 DIAGNOSIS — Z96652 Presence of left artificial knee joint: Secondary | ICD-10-CM | POA: Diagnosis not present

## 2022-09-03 DIAGNOSIS — Z96652 Presence of left artificial knee joint: Secondary | ICD-10-CM | POA: Diagnosis not present

## 2022-09-03 DIAGNOSIS — R2689 Other abnormalities of gait and mobility: Secondary | ICD-10-CM | POA: Diagnosis not present

## 2022-09-03 DIAGNOSIS — M6281 Muscle weakness (generalized): Secondary | ICD-10-CM | POA: Diagnosis not present

## 2022-11-09 ENCOUNTER — Other Ambulatory Visit: Payer: Self-pay | Admitting: Internal Medicine

## 2022-11-19 ENCOUNTER — Telehealth: Payer: Self-pay | Admitting: Internal Medicine

## 2022-11-19 NOTE — Telephone Encounter (Signed)
Zollie Scale RN with access nurse said pt has dizziness and unsteady with walking and access disposition is be seen within 24 hrs. No available appts at [facility] and [facility] this afternoon. Zollie Scale said she would advise pt to go to [facility]. If pt refuses [facility] Zollie Scale RN will have pt call [facility] this afternoon for appt with Dr Alphonsus Sias on 11/22/22. Sending note to Dr Alphonsus Sias as PCP who is out of office and Dr Para March who is in office as FYI.Marland Kitchenper appt notes pt already has appt on 11/22/22 at 2:30 with Dr Alphonsus Sias.

## 2022-11-19 NOTE — Telephone Encounter (Signed)
FYI: This call has been transferred to Access Nurse. Once the result note has been entered staff can address the message at that time.  Patient called in with the following symptoms:  Red Word:elevated blood pressure Patient called in and stated that her blood pressure had been running 170/89 last night and today 138/80. She stated that she has been dizzy and feeling weak.   Please advise at [facility] [phone]  Message is routed to Provider Pool and [facility] Triage

## 2022-11-21 NOTE — Telephone Encounter (Signed)
No apparent urgent care /ER visit She is on for tomorrow

## 2022-11-22 ENCOUNTER — Ambulatory Visit (INDEPENDENT_AMBULATORY_CARE_PROVIDER_SITE_OTHER): Payer: Medicare Other | Admitting: Internal Medicine

## 2022-11-22 ENCOUNTER — Encounter: Payer: Self-pay | Admitting: Internal Medicine

## 2022-11-22 VITALS — BP 128/88 | HR 66 | Temp 97.4°F | Ht 61.5 in | Wt 133.0 lb

## 2022-11-22 DIAGNOSIS — I951 Orthostatic hypotension: Secondary | ICD-10-CM | POA: Diagnosis not present

## 2022-11-22 HISTORY — DX: Orthostatic hypotension: I95.1

## 2022-11-22 NOTE — Patient Instructions (Signed)
Orthostatic Hypotension Blood pressure is a measurement of how strongly, or weakly, your circulating blood is pressing against the walls of your arteries. Orthostatic hypotension is a drop in blood pressure that can happen when you change positions, such as when you go from lying down to standing. Arteries are blood vessels that carry blood from your heart throughout your body. When blood pressure is too low, you may not get enough blood to your brain or to the rest of your organs. Orthostatic hypotension can cause light-headedness, sweating, rapid heartbeat, blurred vision, and fainting. These symptoms require further investigation into the cause. What are the causes? Orthostatic hypotension can be caused by many things, including: Sudden changes in posture, such as standing up quickly after you have been sitting or lying down. Loss of blood (anemia) or loss of body fluids (dehydration). Heart problems, neurologic problems, or hormone problems. Pregnancy. Aging. The risk for this condition increases as you get older. Severe infection (sepsis). Certain medicines, such as medicines for high blood pressure or medicines that make the body lose excess fluids (diuretics). What are the signs or symptoms? Symptoms of this condition may include: Weakness, light-headedness, or dizziness. Sweating. Blurred vision. Tiredness (fatigue). Rapid heartbeat. Fainting, in severe cases. How is this diagnosed? This condition is diagnosed based on: Your symptoms and medical history. Your blood pressure measurements. Your health care provider will check your blood pressure when you are: Lying down. Sitting. Standing. A blood pressure reading is recorded as two numbers, such as "120 over 80" (or 120/80). The first ("top") number is called the systolic pressure. It is a measure of the pressure in your arteries as your heart beats. The second ("bottom") number is called the diastolic pressure. It is a measure of  the pressure in your arteries when your heart relaxes between beats. Blood pressure is measured in a unit called mmHg. Healthy blood pressure for most adults is 120/80 mmHg. Orthostatic hypotension is defined as a 20 mmHg drop in systolic pressure or a 10 mmHg drop in diastolic pressure within 3 minutes of standing. Other information or tests that may be used to diagnose orthostatic hypotension include: Your other vital signs, such as your heart rate and temperature. Blood tests. An electrocardiogram (ECG) or echocardiogram. A Holter monitor. This is a device you wear that records your heart rhythm continuously, usually for 24-48 hours. Tilt table test. For this test, you will be safely secured to a table that moves you from a lying position to an upright position. Your heart rhythm and blood pressure will be monitored during the test. How is this treated? This condition may be treated by: Changing your diet. This may involve eating more salt (sodium) or drinking more water. Changing the dosage of certain medicines you are taking that might be lowering your blood pressure. Correcting the underlying reason for the orthostatic hypotension. Wearing compression stockings. Taking medicines to raise your blood pressure. Avoiding actions that trigger symptoms. Follow these instructions at home: Medicines Take over-the-counter and prescription medicines only as told by your health care provider. Follow instructions from your health care provider about changing the dosage of your current medicines, if this applies. Do not stop or adjust any of your medicines on your own. Eating and drinking  Drink enough fluid to keep your urine pale yellow. Eat extra salt only as directed. Do not add extra salt to your diet unless advised by your health care provider. Eat frequent, small meals. Avoid standing up suddenly after eating. General instructions    Get up slowly from lying down or sitting positions. This  gives your blood pressure a chance to adjust. Avoid hot showers and excessive heat as directed by your health care provider. Engage in regular physical activity as directed by your health care provider. If you have compression stockings, wear them as told. Keep all follow-up visits. This is important. Contact a health care provider if: You have a fever for more than 2-3 days. You feel more thirsty than usual. You feel dizzy or weak. Get help right away if: You have chest pain. You have a fast or irregular heartbeat. You become sweaty or feel light-headed. You feel short of breath. You faint. You have any symptoms of a stroke. "BE FAST" is an easy way to remember the main warning signs of a stroke: B - Balance. Signs are dizziness, sudden trouble walking, or loss of balance. E - Eyes. Signs are trouble seeing or a sudden change in vision. F - Face. Signs are sudden weakness or numbness of the face, or the face or eyelid drooping on one side. A - Arms. Signs are weakness or numbness in an arm. This happens suddenly and usually on one side of the body. S - Speech. Signs are sudden trouble speaking, slurred speech, or trouble understanding what people say. T - Time. Time to call emergency services. Write down what time symptoms started. You have other signs of a stroke, such as: A sudden, severe headache with no known cause. Nausea or vomiting. Seizure. These symptoms may represent a serious problem that is an emergency. Do not wait to see if the symptoms will go away. Get medical help right away. Call your local emergency services (911 in the [place] Do not drive yourself to the hospital. Summary Orthostatic hypotension is a sudden drop in blood pressure. It can cause light-headedness, sweating, rapid heartbeat, blurred vision, and fainting. Orthostatic hypotension can be diagnosed by having your blood pressure taken while lying down, sitting, and then standing. Treatment may involve  changing your diet, wearing compression stockings, sitting up slowly, adjusting your medicines, or correcting the underlying reason for the orthostatic hypotension. Get help right away if you have chest pain, a fast or irregular heartbeat, or symptoms of a stroke. This information is not intended to replace advice given to you by your health care provider. Make sure you discuss any questions you have with your health care provider. Document Revised: 05/15/2020 Document Reviewed: 05/15/2020 Elsevier Patient Education  2024 [employer]  

## 2022-11-22 NOTE — Progress Notes (Signed)
Subjective:    Patient ID: Julia Snyder, female    DOB: 06-04-1945, 77 y.o.   MRN: [id number]  HPI Here due to dizziness and blood pressure issues With husband  Had TKR in March--did well with this Walking well ---up to a mile--and doing leg strengthening  In the past couple of weeks, was getting up and having dizzy spells Has had past vertigo--like if she is tired Doesn't note rotation--just wobbly and like she might fall down Feels like she might fall (not clear about whether she would pass out)  No symptoms in bed Generally occurs right after standing---may take a minute or more to clear Seemed to be every time she stood---but not as bad now  Husband has checked her BP 179/98 EMT from [facility]--- 130/75-- later that same day  This weekend--- 133/68 This morning 92/69 and 89/68  No chest pain No SOB  Current Outpatient Medications on File Prior to Visit  Medication Sig Dispense Refill   Ascorbic Acid (VITAMIN C) 100 MG tablet Take 1,000 mg by mouth daily.     Calcium Carbonate-Vit D-Min (CALCIUM 1200 PO) Take 1 tablet by mouth daily.     Cholecalciferol 25 MCG (1000 UT) tablet Take 1 tablet by mouth. 125 mcg     Cyanocobalamin (B-12 PO) Take 2,000 mcg by mouth daily.     meclizine (ANTIVERT) 25 MG tablet Take 1 tablet (25 mg total) by mouth 3 (three) times daily as needed for dizziness. 60 tablet 1   Multiple Vitamins-Minerals (MULTIPLE VITAMINS/WOMENS PO) Take 1 tablet by mouth daily.     Omega-3 Fatty Acids (FISH OIL) 1000 MG CAPS Take by mouth daily.     oxybutynin (DITROPAN-XL) 5 MG 24 hr tablet TAKE 1 TABLET(5 MG) BY MOUTH DAILY 90 tablet 3   vitamin k 100 MCG tablet Take 100 mcg by mouth daily.     No current facility-administered medications on file prior to visit.    No Known Allergies  Past Medical History:  Diagnosis Date   Arthritis    Depression    Overactive bladder    Urge incontinence of urine     Past Surgical History:  Procedure  Laterality Date   BREAST BIOPSY Left 2004   benign   BUNIONECTOMY Bilateral    left ~2014, right 2018   COLONOSCOPY     ELBOW FRACTURE SURGERY Bilateral    right 2019, left ~2015   FRACTURE SURGERY     KNEE ARTHROPLASTY Left 05/14/2022   Procedure: COMPUTER ASSISTED TOTAL KNEE ARTHROPLASTY - RNFA;  Surgeon: Donato Heinz, MD;  Location: [facility] ORS;  Service: Orthopedics;  Laterality: Left;   ORIF ELBOW FRACTURE Right 12/01/2017   Procedure: OPEN REDUCTION INTERNAL FIXATION (ORIF) ELBOW/OLECRANON FRACTURE;  Surgeon: Kennedy Bucker, MD;  Location: [facility] ORS;  Service: Orthopedics;  Laterality: Right;   OTHER SURGICAL HISTORY  2015   left arm surgery with titanium plate    Family History  Problem Relation Age of Onset   Stroke Mother    Heart disease Mother    Parkinson's disease Father    Diabetes Neg Hx    Cancer Neg Hx     Social History   Socioeconomic History   Marital status: Married    Spouse name: Luis   Number of children: 1   Years of education: Not on file   Highest education level: Not on file  Occupational History   Occupation: Psychologist, 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  Tobacco Use   Smoking status: Never    Passive exposure: Past   Smokeless tobacco: Never  Vaping Use   Vaping status: Never Used  Substance and Sexual Activity   Alcohol use: Yes    Comment: occassional   Drug use: Never   Sexual activity: Not on file  Other Topics Concern   Not on file  Social History Narrative   Has living will   Husband is health care POA--then daughter   Requests DNR--done 03/02/21   Not sure about feeding tube   Social Determinants of Health   Financial Resource Strain: Not on file  Food Insecurity: No Food Insecurity (05/17/2022)   Hunger Vital Sign    Worried About Running Out of Food in the Last Year: Never true    Ran Out of Food in the Last Year: Never true  Transportation Needs: No Transportation Needs (05/17/2022)   PRAPARE -  Administrator, Civil Service (Medical): No    Lack of Transportation (Non-Medical): No  Physical Activity: Not on file  Stress: Not on file  Social Connections: Not on file  Intimate Partner Violence: Not At Risk (05/14/2022)   Humiliation, Afraid, Rape, and Kick questionnaire    Fear of Current or Ex-Partner: No    Emotionally Abused: No    Physically Abused: No    Sexually Abused: No   Review of Systems Eating okay Weight is up some Sleeping okay    Objective:   Physical Exam Constitutional:      Appearance: Normal appearance.  Cardiovascular:     Rate and Rhythm: Normal rate and regular rhythm.     Heart sounds: No murmur heard.    No gallop.  Pulmonary:     Effort: Pulmonary effort is normal.     Breath sounds: Normal breath sounds. No wheezing or rales.  Musculoskeletal:     Cervical back: Neck supple.     Right lower leg: No edema.     Left lower leg: No edema.  Lymphadenopathy:     Cervical: No cervical adenopathy.  Neurological:     Mental Status: She is alert.            Assessment & Plan:

## 2022-11-22 NOTE — Assessment & Plan Note (Signed)
supine BP 160/76 and pulse 60 Standing BP 120/60 and pulse 78  Will check labs and cortisol Asked her to liberalize salt intake Consider medication or cardiology evaluation if symptoms persist

## 2022-11-23 LAB — CBC
HCT: 41 % (ref 36.0–46.0)
Hemoglobin: 13.5 g/dL (ref 12.0–15.0)
MCHC: 32.8 g/dL (ref 30.0–36.0)
MCV: 88.7 fl (ref 78.0–100.0)
Platelets: 329 10*3/uL (ref 150.0–400.0)
RBC: 4.63 Mil/uL (ref 3.87–5.11)
RDW: 14.3 % (ref 11.5–15.5)
WBC: 5.5 10*3/uL (ref 4.0–10.5)

## 2022-11-23 LAB — COMPREHENSIVE METABOLIC PANEL
ALT: 16 U/L (ref 0–35)
AST: 21 U/L (ref 0–37)
Albumin: 4.2 g/dL (ref 3.5–5.2)
Alkaline Phosphatase: 88 U/L (ref 39–117)
BUN: 14 mg/dL (ref 6–23)
CO2: 30 meq/L (ref 19–32)
Calcium: 9.7 mg/dL (ref 8.4–10.5)
Chloride: 98 meq/L (ref 96–112)
Creatinine, Ser: 0.74 mg/dL (ref 0.40–1.20)
GFR: 78.12 mL/min (ref >60.00)
Glucose, Bld: 89 mg/dL (ref 70–99)
Potassium: 4 meq/L (ref 3.5–5.1)
Sodium: 136 meq/L (ref 135–145)
Total Bilirubin: 0.4 mg/dL (ref 0.2–1.2)
Total Protein: 6.7 g/dL (ref 6.0–8.3)

## 2022-11-23 LAB — CORTISOL: Cortisol, Plasma: 6.6 ug/dL

## 2022-12-09 DIAGNOSIS — Z96652 Presence of left artificial knee joint: Secondary | ICD-10-CM | POA: Diagnosis not present

## 2022-12-10 DIAGNOSIS — Z23 Encounter for immunization: Secondary | ICD-10-CM | POA: Diagnosis not present

## 2023-01-03 ENCOUNTER — Ambulatory Visit (INDEPENDENT_AMBULATORY_CARE_PROVIDER_SITE_OTHER): Payer: Medicare Other | Admitting: Internal Medicine

## 2023-01-03 ENCOUNTER — Encounter: Payer: Self-pay | Admitting: Internal Medicine

## 2023-01-03 VITALS — BP 124/76 | HR 57 | Temp 98.9°F | Ht 61.5 in | Wt 137.0 lb

## 2023-01-03 DIAGNOSIS — R058 Other specified cough: Secondary | ICD-10-CM | POA: Diagnosis not present

## 2023-01-03 MED ORDER — BENZONATATE 200 MG PO CAPS: 200.0000 mg | ORAL_CAPSULE | Freq: Three times a day (TID) | ORAL | 0 refills | Status: DC | PRN

## 2023-01-03 NOTE — Patient Instructions (Signed)
Please raise your head at night---prop up some Use the cough med (prescription) and over the counter famotidine 20mg  and cetirizine 10mg  a little before bedtime. When the cough goes away----you can cut back on one medication at a time

## 2023-01-03 NOTE — Assessment & Plan Note (Signed)
No symptoms of infection No clear reflux or allergies No new exposures  Will try omeprazole and cetirizine for a while before bedtime Raise head of bed Benzonatate 200 tid prn

## 2023-01-03 NOTE — Progress Notes (Signed)
Subjective:    Patient ID: Julia Snyder, female    DOB: 02-Dec-1945, 77 y.o.   MRN: [id number]  HPI Here due to cough  Has been coughing at night Gets phlegm and tries to spit it out--down the back of her throat Going on for several nights Doesn't really feel sick  No fever No heartburn No sore throat No ear pain or headache  No new pillow, etc  Current Outpatient Medications on File Prior to Visit  Medication Sig Dispense Refill   Ascorbic Acid (VITAMIN C) 100 MG tablet Take 1,000 mg by mouth daily.     Calcium Carbonate-Vit D-Min (CALCIUM 1200 PO) Take 1 tablet by mouth daily.     Cholecalciferol 25 MCG (1000 UT) tablet Take 1 tablet by mouth. 125 mcg     Cyanocobalamin (B-12 PO) Take 2,000 mcg by mouth daily.     Multiple Vitamins-Minerals (MULTIPLE VITAMINS/WOMENS PO) Take 1 tablet by mouth daily.     Omega-3 Fatty Acids (FISH OIL) 1000 MG CAPS Take by mouth daily.     oxybutynin (DITROPAN-XL) 5 MG 24 hr tablet TAKE 1 TABLET(5 MG) BY MOUTH DAILY 90 tablet 3   vitamin k 100 MCG tablet Take 100 mcg by mouth daily.     meclizine (ANTIVERT) 25 MG tablet Take 1 tablet (25 mg total) by mouth 3 (three) times daily as needed for dizziness. (Patient not taking: Reported on 01/03/2023) 60 tablet 1   No current facility-administered medications on file prior to visit.    No Known Allergies  Past Medical History:  Diagnosis Date   Arthritis    Depression    Overactive bladder    Urge incontinence of urine     Past Surgical History:  Procedure Laterality Date   BREAST BIOPSY Left 2004   benign   BUNIONECTOMY Bilateral    left ~2014, right 2018   COLONOSCOPY     ELBOW FRACTURE SURGERY Bilateral    right 2019, left ~2015   FRACTURE SURGERY     KNEE ARTHROPLASTY Left 05/14/2022   Procedure: COMPUTER ASSISTED TOTAL KNEE ARTHROPLASTY - RNFA;  Surgeon: Donato Heinz, MD;  Location: [facility];  Service: Orthopedics;  Laterality: Left;   ORIF ELBOW FRACTURE Right 12/01/2017    Procedure: OPEN REDUCTION INTERNAL FIXATION (ORIF) ELBOW/OLECRANON FRACTURE;  Surgeon: Kennedy Bucker, MD;  Location: [facility];  Service: Orthopedics;  Laterality: Right;   OTHER SURGICAL HISTORY  2015   left arm surgery with titanium plate    Family History  Problem Relation Age of Onset   Stroke Mother    Heart disease Mother    Parkinson's disease Father    Diabetes Neg Hx    Cancer Neg Hx     Social History   Socioeconomic History   Marital status: Married    Spouse name: Luis   Number of children: 1   Years of education: Not on file   Highest education level: Not on file  Occupational History   Occupation: Glass blower/designer    Comment: Retired  Tobacco Use   Smoking status: Never    Passive exposure: Past   Smokeless tobacco: Never  Vaping Use   Vaping status: Never Used  Substance and Sexual Activity   Alcohol use: Yes    Comment: occassional   Drug use: Never   Sexual activity: Not on file  Other Topics Concern   Not on file  Social History Narrative   Has living will  Husband is health care POA--then daughter   Requests DNR--done 03/02/21   Not sure about feeding tube   Social Determinants of Health   Financial Resource Strain: Not on file  Food Insecurity: No Food Insecurity (05/17/2022)   Hunger Vital Sign    Worried About Running Out of Food in the Last Year: Never true    Ran Out of Food in the Last Year: Never true  Transportation Needs: No Transportation Needs (05/17/2022)   PRAPARE - Administrator, Civil Service (Medical): No    Lack of Transportation (Non-Medical): No  Physical Activity: Not on file  Stress: Not on file  Social Connections: Not on file  Intimate Partner Violence: Not At Risk (05/14/2022)   Humiliation, Afraid, Rape, and Kick questionnaire    Fear of Current or Ex-Partner: No    Emotionally Abused: No    Physically Abused: No    Sexually Abused: No   Review of Systems TKR 6 months  ago Dizziness is better now--only mild symptoms (just gets up slowly)    Objective:   Physical Exam Constitutional:      Appearance: Normal appearance.  HENT:     Head:     Comments: No sinus tenderness    Right Ear: Tympanic membrane and ear canal normal.     Left Ear: Tympanic membrane and ear canal normal.     Mouth/Throat:     Pharynx: No oropharyngeal exudate or posterior oropharyngeal erythema.  Pulmonary:     Effort: Pulmonary effort is normal.     Breath sounds: Normal breath sounds. No wheezing or rales.  Musculoskeletal:     Cervical back: Neck supple.  Lymphadenopathy:     Cervical: No cervical adenopathy.  Neurological:     Mental Status: She is alert.            Assessment & Plan:

## 2023-01-06 DIAGNOSIS — Z23 Encounter for immunization: Secondary | ICD-10-CM | POA: Diagnosis not present

## 2023-01-25 ENCOUNTER — Other Ambulatory Visit: Payer: Self-pay | Admitting: Internal Medicine

## 2023-01-28 ENCOUNTER — Other Ambulatory Visit: Payer: Self-pay | Admitting: Internal Medicine

## 2023-01-28 ENCOUNTER — Telehealth: Payer: Self-pay | Admitting: Internal Medicine

## 2023-01-28 MED ORDER — OXYBUTYNIN CHLORIDE ER 5 MG PO TB24: 5.0000 mg | ORAL_TABLET | Freq: Every day | ORAL | 3 refills | Status: DC

## 2023-01-28 NOTE — Telephone Encounter (Signed)
Spoke to pt. Advised her we sent the rx 11-10-22 #90/3. Advised she would need to speak to someone at the pharmacy to look in her profile.

## 2023-01-28 NOTE — Telephone Encounter (Addendum)
Pt called back stating the pharmacy told her they won't be able to provider her with a refill. Pt states the pharmacy mentioned the office contacting them to discuss more. Pt is requesting help. Call back # [phone]

## 2023-01-28 NOTE — Telephone Encounter (Signed)
Spoke to [place] at the pharmacy. She said they did not received the refill in August. I have sent it back to them.

## 2023-01-28 NOTE — Telephone Encounter (Signed)
Prescription Request  01/28/2023  LOV: 01/03/2023  What is the name of the medication or equipment? oxybutynin (DITROPAN-XL) 5 MG 24 hr tablet   Have you contacted your pharmacy to request a refill? No   Which pharmacy would you like this sent to?  [id number] - [place], [place] - [address] AT [facility] [address] [facility] [address] [place] [address] Phone: [phone] Fax: [phone]    Patient notified that their request is being sent to the clinical staff for review and that they should receive a response within 2 business days.   Please advise at Mobile [phone] (mobile)

## 2023-01-28 NOTE — Addendum Note (Signed)
Addended by: Eual Fines on: 01/28/2023 10:00 AM   Modules accepted: Orders

## 2023-03-18 ENCOUNTER — Encounter: Payer: Medicare Other | Admitting: Internal Medicine

## 2023-03-24 ENCOUNTER — Encounter: Payer: Self-pay | Admitting: Internal Medicine

## 2023-03-24 ENCOUNTER — Ambulatory Visit (INDEPENDENT_AMBULATORY_CARE_PROVIDER_SITE_OTHER): Payer: Medicare Other | Admitting: Internal Medicine

## 2023-03-24 VITALS — BP 118/72 | HR 76 | Temp 97.6°F | Ht 63.0 in | Wt 136.4 lb

## 2023-03-24 DIAGNOSIS — F39 Unspecified mood [affective] disorder: Secondary | ICD-10-CM

## 2023-03-24 DIAGNOSIS — G3184 Mild cognitive impairment, so stated: Secondary | ICD-10-CM | POA: Diagnosis not present

## 2023-03-24 DIAGNOSIS — N3941 Urge incontinence: Secondary | ICD-10-CM | POA: Diagnosis not present

## 2023-03-24 DIAGNOSIS — Z0000 Encounter for general adult medical examination without abnormal findings: Secondary | ICD-10-CM | POA: Diagnosis not present

## 2023-03-24 LAB — COMPREHENSIVE METABOLIC PANEL
ALT: 17 U/L (ref 0–35)
AST: 22 U/L (ref 0–37)
Albumin: 4.5 g/dL (ref 3.5–5.2)
Alkaline Phosphatase: 93 U/L (ref 39–117)
BUN: 13 mg/dL (ref 6–23)
CO2: 29 meq/L (ref 19–32)
Calcium: 9.5 mg/dL (ref 8.4–10.5)
Chloride: 100 meq/L (ref 96–112)
Creatinine, Ser: 0.71 mg/dL (ref 0.40–1.20)
GFR: 81.9 mL/min (ref >60.00)
Glucose, Bld: 101 mg/dL — ABNORMAL HIGH (ref 70–99)
Potassium: 3.9 meq/L (ref 3.5–5.1)
Sodium: 139 meq/L (ref 135–145)
Total Bilirubin: 0.6 mg/dL (ref 0.2–1.2)
Total Protein: 6.8 g/dL (ref 6.0–8.3)

## 2023-03-24 LAB — VITAMIN B12: Vitamin B-12: 1348 pg/mL — ABNORMAL HIGH (ref 211–911)

## 2023-03-24 LAB — TSH: TSH: 1.47 u[IU]/mL (ref 0.35–5.50)

## 2023-03-24 LAB — CBC
HCT: 44.1 % (ref 36.0–46.0)
Hemoglobin: 14.4 g/dL (ref 12.0–15.0)
MCHC: 32.7 g/dL (ref 30.0–36.0)
MCV: 89.5 fL (ref 78.0–100.0)
Platelets: 339 10*3/uL (ref 150.0–400.0)
RBC: 4.93 Mil/uL (ref 3.87–5.11)
RDW: 14.9 % (ref 11.5–15.5)
WBC: 5.7 10*3/uL (ref 4.0–10.5)

## 2023-03-24 MED ORDER — MIRABEGRON ER 25 MG PO TB24: 25.0000 mg | ORAL_TABLET | Freq: Every day | ORAL | 11 refills | Status: DC

## 2023-03-24 NOTE — Progress Notes (Signed)
 Subjective:    Patient ID: Julia Snyder, female    DOB: 09/28/1945, 78 y.o.   MRN: [id number]  HPI Here for Medicare wellness visit and follow up of chronic health conditions With husband Reviewed advanced directives Reviewed other doctors---Dr [facility], Dr Arloa, Dr Randy Had left TKR in March and did rehab. No other surgery or hospitalizations Exercises regularly No falls this year Has occasional down times through her life---not recently. Not anhedonic Vision is okay Hearing is fine Occasional alcohol No tobacco Independent with instrumental ADLs  Doing well from the knee replacement Walking 2 miles per day and does some gym work---resistance  Husband still concerned about memory problems Still plays bridge--and no clear functional decline MOCA went from 24 last year to 22 this year Some decline since the surgery though---husband notices progression (losing things, forgets simple things, repeats herself)  Continues to take the oxybutynin  Discussed trying a different agent  No chest pain or SOB No dizziness or syncope No palpitations   Current Outpatient Medications on File Prior to Visit  Medication Sig Dispense Refill   Ascorbic Acid (VITAMIN C) 100 MG tablet Take 1,000 mg by mouth daily.     Calcium  Carbonate-Vit D-Min (CALCIUM  1200 PO) Take 1 tablet by mouth daily.     Cholecalciferol 25 MCG (1000 UT) tablet Take 1 tablet by mouth. 125 mcg     Cyanocobalamin  (B-12 PO) Take 2,000 mcg by mouth daily.     Multiple Vitamins-Minerals (MULTIPLE VITAMINS/WOMENS PO) Take 1 tablet by mouth daily.     Omega-3 Fatty Acids (FISH OIL) 1000 MG CAPS Take by mouth daily.     oxybutynin  (DITROPAN -XL) 5 MG 24 hr tablet Take 1 tablet (5 mg total) by mouth at bedtime. 90 tablet 3   vitamin k 100 MCG tablet Take 100 mcg by mouth daily.     meclizine  (ANTIVERT ) 25 MG tablet Take 1 tablet (25 mg total) by mouth 3 (three) times daily as needed for dizziness.  (Patient not taking: Reported on 03/24/2023) 60 tablet 1   No current facility-administered medications on file prior to visit.    No Known Allergies  Past Medical History:  Diagnosis Date   Arthritis    Depression    Overactive bladder    Urge incontinence of urine     Past Surgical History:  Procedure Laterality Date   BREAST BIOPSY Left 2004   benign   BUNIONECTOMY Bilateral    left ~2014, right 2018   COLONOSCOPY     ELBOW FRACTURE SURGERY Bilateral    right 2019, left ~2015   FRACTURE SURGERY     KNEE ARTHROPLASTY Left 05/14/2022   Procedure: COMPUTER ASSISTED TOTAL KNEE ARTHROPLASTY - RNFA;  Surgeon: Mardee Lynwood SQUIBB, MD;  Location: [facility];  Service: Orthopedics;  Laterality: Left;   ORIF ELBOW FRACTURE Right 12/01/2017   Procedure: OPEN REDUCTION INTERNAL FIXATION (ORIF) ELBOW/OLECRANON FRACTURE;  Surgeon: Kathlynn Sharper, MD;  Location: [facility];  Service: Orthopedics;  Laterality: Right;   OTHER SURGICAL HISTORY  2015   left arm surgery with titanium plate    Family History  Problem Relation Age of Onset   Stroke Mother    Heart disease Mother    Parkinson's disease Father    Diabetes Neg Hx    Cancer Neg Hx     Social History   Socioeconomic History   Marital status: Married    Spouse name: Sheree   Number of children: 1   Years of education: Not on  file   Highest education level: Not on file  Occupational History   Occupation: Psychologist, forensic and guidance counselor    Comment: Retired  Tobacco Use   Smoking status: Never    Passive exposure: Past   Smokeless tobacco: Never  Vaping Use   Vaping status: Never Used  Substance and Sexual Activity   Alcohol use: Yes    Comment: occassional   Drug use: Never   Sexual activity: Not on file  Other Topics Concern   Not on file  Social History Narrative   Has living will   Husband is health care POA--then daughter   Requests DNR--done 03/02/21   Not sure about feeding tube   Social Drivers of  Health   Financial Resource Strain: Not on file  Food Insecurity: No Food Insecurity (05/17/2022)   Hunger Vital Sign    Worried About Running Out of Food in the Last Year: Never true    Ran Out of Food in the Last Year: Never true  Transportation Needs: No Transportation Needs (05/17/2022)   PRAPARE - Administrator, Civil Service (Medical): No    Lack of Transportation (Non-Medical): No  Physical Activity: Not on file  Stress: Not on file  Social Connections: Not on file  Intimate Partner Violence: Not At Risk (05/14/2022)   Humiliation, Afraid, Rape, and Kick questionnaire    Fear of Current or Ex-Partner: No    Emotionally Abused: No    Physically Abused: No    Sexually Abused: No   Review of Systems Appetite is okay Weight stable Sleeps well Wears seat belt Teeth are fine--keeps up with dentist No suspicious skin lesions No heartburn or dysphagia Bowels move fine--no blood No other joint or back pains now    Objective:   Physical Exam Constitutional:      Appearance: Normal appearance.  HENT:     Mouth/Throat:     Pharynx: No oropharyngeal exudate or posterior oropharyngeal erythema.  Eyes:     Conjunctiva/sclera: Conjunctivae normal.     Pupils: Pupils are equal, round, and reactive to light.  Cardiovascular:     Rate and Rhythm: Normal rate and regular rhythm.     Pulses: Normal pulses.     Heart sounds: No murmur heard.    No gallop.  Pulmonary:     Effort: Pulmonary effort is normal.     Breath sounds: Normal breath sounds. No wheezing or rales.  Abdominal:     Palpations: Abdomen is soft.     Tenderness: There is no abdominal tenderness.  Musculoskeletal:     Cervical back: Neck supple.     Right lower leg: No edema.     Left lower leg: No edema.  Lymphadenopathy:     Cervical: No cervical adenopathy.  Skin:    Findings: No rash.  Neurological:     General: No focal deficit present.     Mental Status: She is alert and oriented to person,  place, and time.  Psychiatric:        Mood and Affect: Mood normal.        Behavior: Behavior normal.            Assessment & Plan:

## 2023-03-24 NOTE — Assessment & Plan Note (Signed)
 MOCA down to 22 Husband notes clear progression Will stop the oxybutynin Neurology evaluation

## 2023-03-24 NOTE — Assessment & Plan Note (Signed)
 Will stop the oxybutynin----change to mirabegron

## 2023-03-24 NOTE — Assessment & Plan Note (Signed)
 Long standing dysthymia--but actually better lately. No meds needed

## 2023-03-24 NOTE — Patient Instructions (Signed)
 Please stop the oxybutynin. Try the mirabegron instead for your bladder issues

## 2023-03-24 NOTE — Assessment & Plan Note (Signed)
 I have personally reviewed the Medicare Annual Wellness questionnaire and have noted 1. The patient's medical and social history 2. Their use of alcohol, tobacco or illicit drugs 3. Their current medications and supplements 4. The patient's functional ability including ADL's, fall risks, home safety risks and hearing or visual             impairment. 5. Diet and physical activities 6. Evidence for depression or mood disorders  The patients weight, height, BMI and visual acuity have been recorded in the chart I have made referrals, counseling and provided education to the patient based review of the above and I have provided the pt with a written personalized care plan for preventive services.  I have provided you with a copy of your personalized plan for preventive services. Please take the time to review along with your updated medication list.  Prefers no more cancer screening Exercises regularly Had flu/COVID vaccines Needs RSV

## 2023-04-14 ENCOUNTER — Encounter: Payer: Self-pay | Admitting: *Deleted

## 2023-05-02 DIAGNOSIS — Z23 Encounter for immunization: Secondary | ICD-10-CM | POA: Diagnosis not present

## 2023-05-10 DIAGNOSIS — Z96652 Presence of left artificial knee joint: Secondary | ICD-10-CM | POA: Diagnosis not present

## 2023-05-18 ENCOUNTER — Encounter: Payer: Self-pay | Admitting: Student

## 2023-05-18 ENCOUNTER — Ambulatory Visit: Payer: Medicare Other | Admitting: Student

## 2023-05-18 VITALS — BP 134/76 | HR 63 | Temp 97.8°F | Ht 61.5 in | Wt 139.0 lb

## 2023-05-18 DIAGNOSIS — G3184 Mild cognitive impairment, so stated: Secondary | ICD-10-CM | POA: Diagnosis not present

## 2023-05-18 DIAGNOSIS — Z96652 Presence of left artificial knee joint: Secondary | ICD-10-CM

## 2023-05-18 DIAGNOSIS — N3941 Urge incontinence: Secondary | ICD-10-CM | POA: Diagnosis not present

## 2023-05-18 DIAGNOSIS — E785 Hyperlipidemia, unspecified: Secondary | ICD-10-CM

## 2023-05-18 DIAGNOSIS — R413 Other amnesia: Secondary | ICD-10-CM | POA: Diagnosis not present

## 2023-05-18 DIAGNOSIS — Z1159 Encounter for screening for other viral diseases: Secondary | ICD-10-CM | POA: Diagnosis not present

## 2023-05-18 NOTE — Progress Notes (Signed)
 [facility]: [facility] Provider: Sydnee Cabal  Code Status: Full Code Goals of Care:     05/18/2023    3:50 PM  Advanced Directives  Does Patient Have a Medical Advance Directive? Yes  Type of Estate agent of [place];Out of facility DNR (pink MOST or yellow form);Living will  Does patient want to make changes to medical advance directive? No - Patient declined  Copy of Healthcare Power of Attorney in Chart? No - copy requested     Chief Complaint  Patient presents with   Establish Care    NP to Establish Care.     HPI: Patient is a 78 y.o. female seen today for medical management of chronic diseases.   Discussed the use of AI scribe software for clinical note transcription with the patient, who gave verbal consent to proceed.  History of Present Illness   Julia Snyder is a 78 year old female who presents to establish care.   She has been experiencing memory concerns and has undergone memory assessments in 2023 and 2024, which were stable. She has not started Aricept yet but plans to pick it up from the pharmacy. No significant memory issues such as forgetting to dress appropriately for the weather or forgetting items like an umbrella. She does not cook much as her husband manages the kitchen due to his plant-based diet following a heart attack.  She has a history of orthostatic hypotension, which was associated with dizziness and fatigue in the past, but she has not experienced dizziness recently. She attributes past dizziness to fatigue and has been off meclizine for some time. She is cautious about getting up too quickly to avoid dizziness.  She has a history of broken bones, including a severe elbow fracture in [place], another elbow fracture, and a fall in [place] leading to a left knee replacement. The knee replacement occurred on March 1st of the previous year, and she reports significant fatigue during recovery, which she  attributes to the surgery and anesthesia. She is gradually increasing her walking distance to two miles a day and attends the gym regularly.  She follows a plant-based diet, which she started after her husband's heart attack. She occasionally eats salmon and has lost weight on this diet. She consumes about three cups of coffee daily without cream or sugar and acknowledges that caffeine may contribute to urinary symptoms.  She has a history of high cholesterol, but her last recorded levels in 2018 were normal. She is not on any medication for cholesterol and believes her plant-based diet has helped manage it. She recently received a carotid artery calcification notice from her dentist, which led to further evaluation.  She takes various vitamins, including Vitamin K2, which she believes helps direct calcium to the bones rather than soft tissues. She takes her vitamins after breakfast, which typically consists of oatmeal with fruit and cinnamon.         Past Medical History:  Diagnosis Date   Arthritis    Depression    Overactive bladder    Urge incontinence of urine     Past Surgical History:  Procedure Laterality Date   BREAST BIOPSY Left 2004   benign   BUNIONECTOMY Bilateral    left ~2014, right 2018   COLONOSCOPY     ELBOW FRACTURE SURGERY Bilateral    right 2019, left ~2015   FRACTURE SURGERY     KNEE ARTHROPLASTY Left 05/14/2022   Procedure: COMPUTER ASSISTED TOTAL  KNEE ARTHROPLASTY - RNFA;  Surgeon: Donato Heinz, MD;  Location: [facility];  Service: Orthopedics;  Laterality: Left;   ORIF ELBOW FRACTURE Right 12/01/2017   Procedure: OPEN REDUCTION INTERNAL FIXATION (ORIF) ELBOW/OLECRANON FRACTURE;  Surgeon: Kennedy Bucker, MD;  Location: [facility] ORS;  Service: Orthopedics;  Laterality: Right;   OTHER SURGICAL HISTORY  2015   left arm surgery with titanium plate    No Known Allergies  Outpatient Encounter Medications as of 05/18/2023  Medication Sig   Ascorbic Acid (VITAMIN C)  100 MG tablet Take 1,000 mg by mouth daily.   Calcium Carbonate-Vit D-Min (CALCIUM 1200 PO) Take 1 tablet by mouth daily.   Cholecalciferol 25 MCG (1000 UT) tablet Take 1 tablet by mouth. 125 mcg   Cyanocobalamin (B-12 PO) Take 2,000 mcg by mouth daily.   donepezil (ARICEPT) 5 MG tablet Take 5 mg by mouth at bedtime.   Multiple Vitamins-Minerals (MULTIPLE VITAMINS/WOMENS PO) Take 1 tablet by mouth daily.   Omega-3 Fatty Acids (FISH OIL) 1000 MG CAPS Take by mouth daily.   vitamin k 100 MCG tablet Take 100 mcg by mouth daily.   [DISCONTINUED] meclizine (ANTIVERT) 25 MG tablet Take 1 tablet (25 mg total) by mouth 3 (three) times daily as needed for dizziness.   [DISCONTINUED] mirabegron ER (MYRBETRIQ) 25 MG TB24 tablet Take 1 tablet (25 mg total) by mouth daily. (Patient not taking: Reported on 05/18/2023)   No facility-administered encounter medications on file as of 05/18/2023.    Review of Systems:  Review of Systems  Health Maintenance  Topic Date Due   Hepatitis C Screening  Never done   Medicare Annual Wellness (AWV)  03/23/2024   DTaP/Tdap/Td (2 - Td or Tdap) 11/30/2027   Pneumonia Vaccine 70+ Years old  Completed   INFLUENZA VACCINE  Completed   DEXA SCAN  Completed   COVID-19 Vaccine  Completed   Zoster Vaccines- Shingrix  Completed   HPV VACCINES  Aged Out   Colonoscopy  Discontinued    Physical Exam: Vitals:   05/18/23 1546  BP: 134/76  Pulse: 63  Temp: 97.8 F (36.6 C)  SpO2: 99%  Weight: 139 lb (63 kg)  Height: 5' 1.5" (1.562 m)   Body mass index is 25.84 kg/m. Physical Exam Constitutional:      Appearance: Normal appearance.  HENT:     Head: Normocephalic and atraumatic.     Right Ear: Tympanic membrane normal.     Left Ear: Tympanic membrane normal.     Mouth/Throat:     Mouth: Mucous membranes are moist.  Eyes:     Extraocular Movements: Extraocular movements intact.     Pupils: Pupils are equal, round, and reactive to light.  Cardiovascular:      Rate and Rhythm: Normal rate and regular rhythm.     Pulses: Normal pulses.     Heart sounds: Normal heart sounds.  Pulmonary:     Effort: Pulmonary effort is normal.  Abdominal:     General: Abdomen is flat. Bowel sounds are normal.     Palpations: Abdomen is soft.  Musculoskeletal:        General: No swelling or tenderness.  Skin:    General: Skin is warm and dry.  Neurological:     Mental Status: She is alert and oriented to person, place, and time.     Gait: Gait normal.  Psychiatric:        Mood and Affect: Mood normal.      Labs reviewed: Basic Metabolic  Panel: Recent Labs    11/22/22 1520 03/24/23 0850  NA 136 139  K 4.0 3.9  CL 98 100  CO2 30 29  GLUCOSE 89 101*  BUN 14 13  CREATININE 0.74 0.71  CALCIUM 9.7 9.5  TSH  --  1.47   Liver Function Tests: Recent Labs    11/22/22 1520 03/24/23 0850  AST 21 22  ALT 16 17  ALKPHOS 88 93  BILITOT 0.4 0.6  PROT 6.7 6.8  ALBUMIN 4.2 4.5   No results for input(s): "LIPASE", "AMYLASE" in the last 8760 hours. No results for input(s): "AMMONIA" in the last 8760 hours. CBC: Recent Labs    11/22/22 1520 03/24/23 0850  WBC 5.5 5.7  HGB 13.5 14.4  HCT 41.0 44.1  MCV 88.7 89.5  PLT 329.0 339.0   Lipid Panel: No results for input(s): "CHOL", "HDL", "LDLCALC", "TRIG", "CHOLHDL", "LDLDIRECT" in the last 8760 hours. No results found for: "HGBA1C"  Procedures since last visit: No results found. Results   RADIOLOGY Panoramic Radiograph: Carotid artery calcification  DIAGNOSTIC MOCA: 24/30 (2023) MOCA: 24/30 (2024)      Assessment/Plan     Cognitive Impairment Cognitive function is well-managed with recent evaluations by Dr. Malvin Johns and [facility]. She plans to initiate Aricept (donepezil) 5 mg to stabilize cognitive function and prevent progression. Discontinuation of oxybutynin two months ago may have contributed to cognitive improvement. - Start Aricept 5 mg once daily - Pick up Aricept  prescription from pharmacy  Carotid Artery Calcification Carotid artery calcification was incidentally identified on a dental x-ray. Awaiting carotid ultrasound for further evaluation. No symptoms present. Discussed potential link between calcification and cholesterol levels, though last check in 2018 was normal. Plant-based diet likely supports favorable cholesterol levels. Ultrasound results will guide management, potentially ranging from follow-up in six months to a year or no follow-up, depending on stenosis severity. - Await scheduling of carotid ultrasound - Check cholesterol levels at next lab collection  Orthostatic Hypotension Orthostatic hypotension is not currently problematic, with no recent episodes of dizziness or lightheadedness. Previously associated with fatigue.  Bone Fractures and Knee Replacement Multiple bone fractures from falls, including elbow fractures and left knee injury requiring knee replacement. Significant improvement in mobility, aiming to walk two miles daily. Post-surgical fatigue attributed to anesthesia and recovery.  General Health Maintenance Follows a plant-based diet and received recent vaccinations for RSV, influenza, and COVID-19. Advised cholesterol check due to carotid calcification and hepatitis C screening as no prior record exists. - Order cholesterol test - Screen for hepatitis C at next lab collection  Follow-up Planning a trip to [place] for two and a half weeks. Follow-up scheduled post-trip to discuss carotid ultrasound results and further actions. - Schedule follow-up appointment after return from [place] to discuss ultrasound results       Labs/tests ordered:  * No order type specified * Next appt:  Visit date not found   I spent greater than 60 minutes for the care of this patient in face to face time, chart review, clinical documentation, patient education.

## 2023-05-18 NOTE — Patient Instructions (Signed)
 VISIT SUMMARY:  Julia Snyder, during your visit, we discussed your memory concerns, carotid artery calcification, orthostatic hypotension, history of bone fractures, and general health maintenance. We reviewed your current health status and made plans to address each of these issues.  YOUR PLAN:  -COGNITIVE IMPAIRMENT: Cognitive impairment refers to difficulties with memory and thinking skills. You will start taking Aricept 5 mg once daily to help stabilize your cognitive function and prevent further decline. Please pick up your prescription from the pharmacy.  -CAROTID ARTERY CALCIFICATION: Carotid artery calcification is a buildup of calcium in the carotid arteries, which can be related to cholesterol levels. We are awaiting the scheduling of a carotid ultrasound to evaluate this further. Depending on the results, we will decide on the next steps. We will also check your cholesterol levels at your next lab collection.  -ORTHOSTATIC HYPOTENSION: Orthostatic hypotension is a condition where your blood pressure drops when you stand up, which can cause dizziness. You have not had recent episodes, and it seems to be well-managed.  -BONE FRACTURES AND KNEE REPLACEMENT: You have a history of bone fractures and a knee replacement. Your mobility has significantly improved, and you are working towards walking two miles daily. The fatigue you experienced post-surgery is likely due to anesthesia and recovery.  -GENERAL HEALTH MAINTENANCE: You follow a plant-based diet and have received recent vaccinations for RSV, influenza, and COVID-19. We will check your cholesterol levels and screen for hepatitis C at your next lab collection.  INSTRUCTIONS:  Please schedule a follow-up appointment after your trip to [place] to discuss the results of your carotid ultrasound and any further actions needed.  For more information, you can read your full clinical note, available in your patient portal.

## 2023-05-19 DIAGNOSIS — E785 Hyperlipidemia, unspecified: Secondary | ICD-10-CM | POA: Diagnosis not present

## 2023-05-19 DIAGNOSIS — Z1159 Encounter for screening for other viral diseases: Secondary | ICD-10-CM | POA: Diagnosis not present

## 2023-05-19 LAB — HEPATITIS C ANTIBODY: Hepatitis C Ab: NONREACTIVE

## 2023-05-19 LAB — LIPID PANEL
Cholesterol: 241 mg/dL — ABNORMAL HIGH (ref <200)
HDL: 66 mg/dL (ref >=50)
LDL Cholesterol (Calc): 148 mg/dL — ABNORMAL HIGH
Non-HDL Cholesterol (Calc): 175 mg/dL — ABNORMAL HIGH (ref <130)
Total CHOL/HDL Ratio: 3.7 (calc) (ref <5.0)
Triglycerides: 142 mg/dL (ref <150)

## 2023-05-24 DIAGNOSIS — R413 Other amnesia: Secondary | ICD-10-CM | POA: Diagnosis not present

## 2023-05-30 ENCOUNTER — Encounter: Payer: Self-pay | Admitting: Student

## 2023-06-20 DIAGNOSIS — M47816 Spondylosis without myelopathy or radiculopathy, lumbar region: Secondary | ICD-10-CM | POA: Insufficient documentation

## 2023-06-20 DIAGNOSIS — M47896 Other spondylosis, lumbar region: Secondary | ICD-10-CM | POA: Diagnosis not present

## 2023-06-20 DIAGNOSIS — M545 Low back pain, unspecified: Secondary | ICD-10-CM | POA: Insufficient documentation

## 2023-06-20 HISTORY — DX: Spondylosis without myelopathy or radiculopathy, lumbar region: M47.816

## 2023-06-22 DIAGNOSIS — M4856XA Collapsed vertebra, not elsewhere classified, lumbar region, initial encounter for fracture: Secondary | ICD-10-CM | POA: Diagnosis not present

## 2023-06-22 DIAGNOSIS — M545 Low back pain, unspecified: Secondary | ICD-10-CM | POA: Diagnosis not present

## 2023-06-22 DIAGNOSIS — M48061 Spinal stenosis, lumbar region without neurogenic claudication: Secondary | ICD-10-CM | POA: Diagnosis not present

## 2023-06-23 ENCOUNTER — Encounter: Payer: Self-pay | Admitting: Student

## 2023-06-23 ENCOUNTER — Ambulatory Visit: Payer: Self-pay | Admitting: *Deleted

## 2023-06-23 NOTE — Addendum Note (Signed)
 Addended by: Earnestine Mealing on: 06/23/2023 11:27 AM   Modules accepted: Orders

## 2023-06-23 NOTE — Telephone Encounter (Signed)
 Routing to Earnestine Mealing, MD to advise

## 2023-06-23 NOTE — Telephone Encounter (Signed)
 Patient husband calling: Luis(DPR) Chief Complaint: multiple complaints/concerns- medication SE- patient has stopped taking medication  Symptoms: Other concerns: carotid stenosis- on [place] results- concerned about that causing dementia symptoms , fall on cruise- diagnosed with compression fracture([facility]), medication SE-donepezil (ARICEPT) 5 MG tablet   Disposition: [] ED /[] Urgent Care (no appt availability in office) / [] Appointment(In office/virtual)/ []  [facility]/ [] Home Care/ [] Refused Recommended Disposition /[] [facility]/ [x]  Follow-up with PCP Additional Notes: Requesting appointment at [facility] - patient has appointment 06/29/23- and they are fine with that appointment - unless PCP wants to see them before.  Copied from CRM [id number]. Topic: Clinical - Red Word Triage >> Jun 23, 2023  8:53 AM Philippa Chester F wrote: Red word: adverse reaction to medication; just diagnosed with compressed spine Reason for Disposition  [1] Caller has URGENT medicine question about med that PCP or specialist prescribed AND [2] triager unable to answer question  Answer Assessment - Initial Assessment Questions 1. NAME of MEDICINE: "What medicine(s) are you calling about?"     donepezil (ARICEPT) 5 MG tablet 2. QUESTION: "What is your question?" (e.g., double dose of medicine, side effect)     Was on for 5 weeks- after 1 week- patient was zombie like-worse than before, tired- has stopped medication.  3. PRESCRIBER: "Who prescribed the medicine?" Reason: if prescribed by specialist, call should be referred to that group.     PCP 4. SYMPTOMS: "Do you have any symptoms?" If Yes, ask: "What symptoms are you having?"  "How bad are the symptoms (e.g., mild, moderate, severe)     Since stopped medication- patient seems better.  Protocols used: Medication Question Call-A-AH

## 2023-06-23 NOTE — Telephone Encounter (Signed)
 Patient was on trial for donepezil. Okay to go without medication. Can add to "allergy list" for intolerance of medication. Can discuss other options at follow up appointment.

## 2023-06-23 NOTE — Telephone Encounter (Signed)
Mychart message sent to patient with providers response.

## 2023-06-27 ENCOUNTER — Ambulatory Visit (INDEPENDENT_AMBULATORY_CARE_PROVIDER_SITE_OTHER): Admitting: Nurse Practitioner

## 2023-06-27 ENCOUNTER — Encounter (INDEPENDENT_AMBULATORY_CARE_PROVIDER_SITE_OTHER): Payer: Self-pay | Admitting: Nurse Practitioner

## 2023-06-27 VITALS — BP 121/70 | HR 65 | Resp 16 | Ht 62.0 in | Wt 133.2 lb

## 2023-06-27 DIAGNOSIS — I6523 Occlusion and stenosis of bilateral carotid arteries: Secondary | ICD-10-CM

## 2023-06-27 DIAGNOSIS — E785 Hyperlipidemia, unspecified: Secondary | ICD-10-CM | POA: Diagnosis not present

## 2023-06-27 DIAGNOSIS — G3184 Mild cognitive impairment, so stated: Secondary | ICD-10-CM

## 2023-06-28 ENCOUNTER — Encounter (INDEPENDENT_AMBULATORY_CARE_PROVIDER_SITE_OTHER): Payer: Self-pay | Admitting: Nurse Practitioner

## 2023-06-28 NOTE — Progress Notes (Signed)
 Subjective:    Patient ID: Julia Snyder, female    DOB: 05/08/1945, 78 y.o.   MRN: [id number] Chief Complaint  Patient presents with   New Patient (Initial Visit)    Ref Walden Guise consult left carotid artery stenosis    The patient is a 78 year old woman who presents today with her husband as a referral from Dr. Walden Guise in regards to the patient's carotid stenosis.  Initially this began after plaque was noticed during a dental x-ray.  On follow-up it was noted that the patient had a 50 to 69% stenosis of the left ICA.  Additionally during this time the patient began to develop some difficulties with memory and was diagnosed with some mild cognitive changes.    Review of Systems  Psychiatric/Behavioral:         Memory alterations  All other systems reviewed and are negative.      Objective:   Physical Exam Vitals reviewed.  HENT:     Head: Normocephalic.  Neck:     Vascular: No carotid bruit.  Cardiovascular:     Rate and Rhythm: Normal rate.     Pulses: Normal pulses.  Pulmonary:     Effort: Pulmonary effort is normal.  Skin:    General: Skin is warm and dry.  Neurological:     Mental Status: She is alert and oriented to person, place, and time. Mental status is at baseline.  Psychiatric:        Mood and Affect: Mood normal.        Behavior: Behavior normal.        Thought Content: Thought content normal.        Judgment: Judgment normal.     BP 121/70   Pulse 65   Resp 16   Ht 5\' 2"  (1.575 m)   Wt 133 lb 3.2 oz (60.4 kg)   BMI 24.36 kg/m   Past Medical History:  Diagnosis Date   Arthritis    Depression    Overactive bladder    Urge incontinence of urine     Social History   Socioeconomic History   Marital status: Married    Spouse name: Luis   Number of children: 1   Years of education: Not on file   Highest education level: Not on file  Occupational History   Occupation: Glass blower/designer    Comment: Retired  Tobacco  Use   Smoking status: Never    Passive exposure: Past   Smokeless tobacco: Never  Vaping Use   Vaping status: Never Used  Substance and Sexual Activity   Alcohol use: Yes    Comment: occassional   Drug use: Never   Sexual activity: Not on file  Other Topics Concern   Not on file  Social History Narrative   Has living will   Husband is health care POA--then daughter   Requests DNR--done 03/02/21   Not sure about feeding tube   Social Drivers of Health   Financial Resource Strain: Not on file  Food Insecurity: No Food Insecurity (05/17/2022)   Hunger Vital Sign    Worried About Running Out of Food in the Last Year: Never true    Ran Out of Food in the Last Year: Never true  Transportation Needs: No Transportation Needs (05/17/2022)   PRAPARE - Administrator, Civil Service (Medical): No    Lack of Transportation (Non-Medical): No  Physical Activity: Not on file  Stress: Not on file  Social Connections: Not on file  Intimate Partner Violence: Not At Risk (05/14/2022)   Humiliation, Afraid, Rape, and Kick questionnaire    Fear of Current or Ex-Partner: No    Emotionally Abused: No    Physically Abused: No    Sexually Abused: No    Past Surgical History:  Procedure Laterality Date   BREAST BIOPSY Left 2004   benign   BUNIONECTOMY Bilateral    left ~2014, right 2018   COLONOSCOPY     ELBOW FRACTURE SURGERY Bilateral    right 2019, left ~2015   FRACTURE SURGERY     KNEE ARTHROPLASTY Left 05/14/2022   Procedure: COMPUTER ASSISTED TOTAL KNEE ARTHROPLASTY - RNFA;  Surgeon: Arlyne Lame, MD;  Location: [facility] ORS;  Service: Orthopedics;  Laterality: Left;   ORIF ELBOW FRACTURE Right 12/01/2017   Procedure: OPEN REDUCTION INTERNAL FIXATION (ORIF) ELBOW/OLECRANON FRACTURE;  Surgeon: Molli Angelucci, MD;  Location: [facility] OR[facility];  Service: Orthopedics;  Laterality: Right;   OTHER SURGICAL HISTORY  2015   left arm surgery with titanium plate    Family History  Problem  Relation Age of Onset   Stroke Mother    Heart disease Mother    Parkinson's disease Father    Diabetes Neg Hx    Cancer Neg Hx     Allergies  Allergen Reactions   Donepezil Other (See Comments)    Sedation.        Latest Ref Rng & Units 03/24/2023    8:50 AM 11/22/2022    3:20 PM 12/28/2021   10:32 AM  CBC  WBC 4.0 - 10.5 K/uL 5.7  5.5  5.1   Hemoglobin 12.0 - 15.0 g/dL 91.4  78.2  95.6   Hematocrit 36.0 - 46.0 % 44.1  41.0  41.6   Platelets 150.0 - 400.0 K/uL 339.0  329.0  339.0       CMP     Component Value Date/Time   NA 139 03/24/2023 0850   K 3.9 03/24/2023 0850   CL 100 03/24/2023 0850   CO2 29 03/24/2023 0850   GLUCOSE 101 (H) 03/24/2023 0850   BUN 13 03/24/2023 0850   CREATININE 0.71 03/24/2023 0850   CALCIUM 9.5 03/24/2023 0850   PROT 6.8 03/24/2023 0850   ALBUMIN 4.5 03/24/2023 0850   AST 22 03/24/2023 0850   ALT 17 03/24/2023 0850   ALKPHOS 93 03/24/2023 0850   BILITOT 0.6 03/24/2023 0850   GFR 81.90 03/24/2023 0850   GFRNONAA >60 05/03/2022 1416     No results found.     Assessment & Plan:   1. Bilateral carotid artery stenosis Patient's noninvasive studies are concerning for patient and family as she has had a recent memory decline.  There is concern that she may have had previous TIAs or strokes that may have precipitated this.  In addition there is concern that she may have a worsening stenosis than what is noted on ultrasound which could have been the cause of these possible TIAs or strokes.  The patient also has concern if this is related to intracranial vascular plaque as well.  Based on this we will have the patient move forward with a CT of both head and neck.  Will have her return to discuss results and next steps post CT scan. 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  She has notable understandable concerns about possible previous stroke as well as if this cognitive impairment is caused by possible amyloid buildup versus vascular disease.  They would like to discover this in order to try to help stratify their lifestyle changes in order to possibly help improve or delay worsening memory issues.  They have previously met Dr. Mason Sole and attended one of his seminars and was very impressed by him.  We will refer patient to Dr. Mason Sole for additional and further workup. - Ambulatory referral to Neurology  3. Dyslipidemia Currently patient is not on statin but her cholesterol is elevated from her recent studies.  She will be following up with her primary care provider in 2 days but statin is recommended in the setting of her carotid stenosis.  We will defer statin management to PCP.   Current Outpatient Medications on File Prior to Visit  Medication Sig Dispense Refill   Ascorbic Acid (VITAMIN C) 100 MG tablet Take 1,000 mg by mouth daily.     Calcium Carbonate-Vit D-Min (CALCIUM 1200 PO) Take 1 tablet by mouth daily.     Cholecalciferol 25 MCG (1000 UT) tablet Take 1 tablet by mouth. 125 mcg     Cyanocobalamin (B-12 PO) Take 2,000 mcg by mouth daily.     Multiple Vitamins-Minerals (MULTIPLE VITAMINS/WOMENS PO) Take 1 tablet by mouth daily.     Omega-3 Fatty Acids (FISH OIL) 1000 MG CAPS Take by mouth daily.     vitamin k 100 MCG tablet Take 100 mcg by mouth daily.     No current facility-administered medications on file prior to visit.    There are no Patient Instructions on file for this visit. No follow-ups on file.   Meko Bellanger E Evalin Shawhan, NP

## 2023-06-29 ENCOUNTER — Ambulatory Visit: Admitting: Student

## 2023-06-29 ENCOUNTER — Encounter: Payer: Self-pay | Admitting: Student

## 2023-06-29 VITALS — BP 138/80 | HR 64 | Temp 97.9°F | Ht 62.0 in | Wt 134.0 lb

## 2023-06-29 DIAGNOSIS — G3184 Mild cognitive impairment, so stated: Secondary | ICD-10-CM

## 2023-06-29 DIAGNOSIS — I6529 Occlusion and stenosis of unspecified carotid artery: Secondary | ICD-10-CM

## 2023-06-29 DIAGNOSIS — E782 Mixed hyperlipidemia: Secondary | ICD-10-CM

## 2023-06-29 MED ORDER — ATORVASTATIN CALCIUM 40 MG PO TABS: 40.0000 mg | ORAL_TABLET | Freq: Every day | ORAL | 3 refills | Status: DC

## 2023-06-29 NOTE — Patient Instructions (Signed)
 VISIT SUMMARY:  During your visit, we discussed the adverse side effects you experienced with donepezil, including significant fatigue and a foggy affect. We also reviewed your cognitive impairment, carotid artery stenosis, and elevated cholesterol levels. A comprehensive plan was developed to address these issues, including further testing and new medications.  YOUR PLAN:  -COGNITIVE IMPAIRMENT: Cognitive impairment refers to difficulties with memory and thinking skills. We will order a CT scan from your neck through your head to check for vascular issues and potential mini-strokes. An MRI may also be considered to evaluate for brain atrophy or white matter changes. You may be referred to neuropsychology for further cognitive assessment. Continue with your plant-based lifestyle and prevention program, which has shown promise in stabilizing or reversing dementia.  -CAROTID ARTERY STENOSIS: Carotid artery stenosis is the narrowing of the carotid arteries, which can increase the risk of strokes. You will be referred to a vascular specialist for further management. A CT angiogram will be ordered to assess abnormal blood flow. We will start you on atorvastatin therapy, beginning with 20 mg for one week and then increasing to 40 mg, to manage your cholesterol and reduce the risk of further vascular events. Continue taking fish oil and omega-3 fatty acids to support cardiovascular health.  -HYPERLIPIDEMIA: Hyperlipidemia means having high levels of cholesterol in your blood. Managing your cholesterol is crucial given your carotid artery stenosis. We will start you on atorvastatin therapy as outlined under Carotid Artery Stenosis. Your lipid panel will be rechecked in three months to assess your response to the therapy and dietary changes.  -GENERAL HEALTH MAINTENANCE: You are engaged in a plant-based lifestyle and prevention program, which has previously resulted in significant health improvements. Continue  with this approach to support your overall health and cognitive function. Attend monthly sessions as part of the prevention program.  INSTRUCTIONS:  Follow up with the vascular specialist on April 22 for your CT scan results and further management plan. Schedule a follow-up appointment in three months to reassess your cholesterol levels and response to atorvastatin therapy.

## 2023-06-30 ENCOUNTER — Encounter: Payer: Self-pay | Admitting: Student

## 2023-06-30 NOTE — Progress Notes (Addendum)
 [facility]: [facility] Provider: ABDUL  Code Status: Full Code Goals of Care:     09/27/2023   11:51 AM  Advanced Directives  Does Patient Have a Medical Advance Directive? Yes     Chief Complaint  Patient presents with   Medical Management of Chronic Issues    6 week follow-up. 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 Here with spouse    HPI: Patient is a 78 y.o. female seen today for medical management of chronic diseases.   Discussed the use of AI scribe software for clinical note transcription with the patient, who gave verbal consent to proceed.  History of Present Illness   Julia Snyder is a 78 year old female who presents with adverse side effects to donepezil.  She has been experiencing significant fatigue and a foggy affect since starting donepezil for cognitive decline. Her spouse describes her as being 'like a zombie,' with these symptoms becoming particularly noticeable during a recent cruise where she was unusually tired and slept most of the time. No nausea or antihistamine use during the trip. Despite having a cold for about two and a half weeks, she did not skip meals. The donepezil was started at 5 mg, with plans to increase to 10 mg if tolerated, but she did not tolerate the medication well and discontinued it.  She has a history of short-term memory problems and cognitive decline, which led to a referral to neurology. A Montreal Cognitive Assessment was performed, confirming cognitive issues. There is a concern about whether her cognitive decline is due to vascular issues or amyloid plaques. A Doppler ultrasound revealed carotid stenosis with 65-75% blockage, prompting a referral to vascular specialists.  Her family history is significant for her mother having died of a stroke at the age of 27, with a history of vessel problems. This familial history raises concerns about her own vascular health.  She has been on a plant-based diet  for several years, which she believes has positively impacted her health. She is also taking fish oil and omega-3 fatty acids. Her cholesterol levels are elevated, with a total cholesterol of 241 mg/dL and LDL of 851 mg/dL. She has not been on statins previously due to personal preference.         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   Orthostatic hypotension 11/22/2022   Overactive bladder    Urge incontinence of urine    Vertigo 04/11/2018    Past Surgical History:  Procedure Laterality Date   BREAST BIOPSY Left 2004   benign   BUNIONECTOMY Bilateral    left ~2014, right 2018   COLONOSCOPY     ELBOW FRACTURE SURGERY Bilateral    right 2019, left ~2015   ENDARTERECTOMY Left 09/14/2023   Procedure: ENDARTERECTOMY, CAROTID;  Surgeon: Jama Cordella MATSU, MD;  Location: [facility];  Service: Vascular;  Laterality: Left;   FRACTURE SURGERY     KNEE ARTHROPLASTY Left 05/14/2022   Procedure: COMPUTER ASSISTED TOTAL KNEE ARTHROPLASTY - RNFA;  Surgeon: Mardee Lynwood SQUIBB, MD;  Location: [facility];  Service: Orthopedics;  Laterality: Left;   ORIF ELBOW FRACTURE Right 12/01/2017   Procedure: OPEN REDUCTION INTERNAL FIXATION (ORIF) ELBOW/OLECRANON FRACTURE;  Surgeon: Kathlynn,  Ozell, MD;  Location: [facility];  Service: Orthopedics;  Laterality: Right;   OTHER SURGICAL HISTORY  2015   left arm surgery with titanium plate    Allergies  Allergen Reactions   Donepezil Other (See Comments)    Sedation.     Outpatient Encounter Medications as of 06/29/2023  Medication Sig   Ascorbic Acid (VITAMIN C) 1000 MG tablet Take 1,000 mg by mouth daily.   Calcium  Carbonate-Vit D-Min (CALCIUM  1200 PO) Take 1,200 mg by mouth daily.   Cholecalciferol 125 MCG (5000 UT) TABS  Take 5,000 Units by mouth daily.   Cyanocobalamin  (B-12 PO) Take 2,000 mcg by mouth daily.   Multiple Vitamins-Minerals (MULTIPLE VITAMINS/WOMENS PO) Take 1 tablet by mouth daily.   Omega-3 Fatty Acids (FISH OIL PO) Take 2,500 mg by mouth daily.   vitamin k 100 MCG tablet Take 100 mcg by mouth daily.   [DISCONTINUED] atorvastatin  (LIPITOR) 40 MG tablet Take 1 tablet (40 mg total) by mouth daily.   [DISCONTINUED] Ascorbic Acid (VITAMIN C) 100 MG tablet Take 1,000 mg by mouth daily.   [DISCONTINUED] atorvastatin  (LIPITOR) 40 MG tablet Take 1 tablet (40 mg total) by mouth daily. Take 1/2 tablet for 1 week then increase to full tablet (Patient taking differently: Take 40 mg by mouth daily.)   No facility-administered encounter medications on file as of 06/29/2023.    Review of Systems:  Review of Systems  Health Maintenance  Topic Date Due   Hepatitis B Vaccines (3 of 3 - 19+ 3-dose series) 11/20/2012   COVID-19 Vaccine (5 - 2024-25 season) 12/13/2023 (Originally 06/09/2023)   INFLUENZA VACCINE  10/14/2023   Medicare Annual Wellness (AWV)  03/23/2024   DTaP/Tdap/Td (2 - Td or Tdap) 11/30/2027   Pneumococcal Vaccine: 50+ Years  Completed   DEXA SCAN  Completed   Hepatitis C Screening  Completed   Zoster Vaccines- Shingrix  Completed   HPV VACCINES  Aged Out   Meningococcal B Vaccine  Aged Out   Colonoscopy  Discontinued    Physical Exam: Vitals:   06/29/23 1046  BP: 138/80  Pulse: 64  Temp: 97.9 F (36.6 C)  SpO2: 99%  Weight: 134 lb (60.8 kg)  Height: 5' 2 (1.575 m)   Body mass index is 24.51 kg/m. Physical Exam Constitutional:      Appearance: Normal appearance.  Cardiovascular:     Rate and Rhythm: Normal rate and regular rhythm.     Pulses: Normal pulses.     Heart sounds: Normal heart sounds.  Pulmonary:     Effort: Pulmonary effort is normal.  Abdominal:     General: Abdomen is flat. Bowel sounds are normal.     Palpations: Abdomen is soft.  Musculoskeletal:         General: No swelling or tenderness.  Skin:    General: Skin is warm and dry.  Neurological:     Mental Status: She is alert and oriented to person, place, and time.     Gait: Gait normal.  Psychiatric:        Mood and Affect: Mood normal.   Labs reviewed: Basic Metabolic Panel: Recent Labs    03/24/23 0850 08/31/23 0852 09/15/23 0548 09/27/23 1152 09/28/23 0356  NA 139   < > 139 135 138  K 3.9   < > 3.6 4.4 3.8  CL 100   < > 105 98 104  CO2 29   < > 26 26 25   GLUCOSE 101*   < > 104* 138*  101*  BUN 13   < > 9 14 15   CREATININE 0.71   < > 0.57 0.65 0.61  CALCIUM  9.5   < > 8.5* 9.3 9.0  TSH 1.47  --   --  0.709  --    < > = values in this interval not displayed.   Liver Function Tests: Recent Labs    03/24/23 0850 09/27/23 1152 09/28/23 0356  AST 22 27 27   ALT 17 28 24   ALKPHOS 93 97 95  BILITOT 0.6 0.4 0.5  PROT 6.8 6.9 6.4*  ALBUMIN 4.5 4.1 3.7   No results for input(s): LIPASE, AMYLASE in the last 8760 hours. No results for input(s): AMMONIA in the last 8760 hours. CBC: Recent Labs    08/31/23 0852 09/15/23 0548 09/27/23 1152 09/28/23 0356  WBC 5.1 7.8 7.0 6.1  NEUTROABS 2.7  --   --   --   HGB 13.8 11.8* 13.0 12.3  HCT 42.9 36.4 40.6 38.4  MCV 89.0 90.8 90.4 89.5  PLT 308 237 366 318   Lipid Panel: Recent Labs    05/19/23 0735  CHOL 241*  HDL 66  LDLCALC 148*  TRIG 142  CHOLHDL 3.7   No results found for: HGBA1C  Procedures since last visit: US  Carotid Bilateral Result Date: 09/28/2023 CLINICAL DATA:  Near syncope EXAM: BILATERAL CAROTID DUPLEX ULTRASOUND TECHNIQUE: Elnor scale imaging, color Doppler and duplex ultrasound were performed of bilateral carotid and vertebral arteries in the neck. COMPARISON:  CT angiogram of the neck performed July 05, 2023 FINDINGS: Criteria: Quantification of carotid stenosis is based on velocity parameters that correlate the residual internal carotid diameter with NASCET-based stenosis levels, using  the diameter of the distal internal carotid lumen as the denominator for stenosis measurement. The following velocity measurements were obtained: RIGHT ICA: 87 cm/sec CCA: 91 cm/sec SYSTOLIC ICA/CCA RATIO:  1.0 ECA: 144 cm/sec LEFT ICA: 115 cm/sec CCA: 64 cm/sec SYSTOLIC ICA/CCA RATIO:  1.8 ECA: 57 cm/sec RIGHT CAROTID ARTERY: Mild atherosclerotic changes, patent. RIGHT VERTEBRAL ARTERY:  Antegrade. LEFT CAROTID ARTERY: No significant stenosis is appreciated, antegrade flow. LEFT VERTEBRAL ARTERY:  Antegrade. IMPRESSION: 1. Right carotid system: Mild atherosclerotic changes in the proximal right internal carotid artery is estimated stenosis measuring less than 50%. 2. Left carotid system: No evidence of significant atherosclerotic change or flow-limiting stenosis. Within normal limits. Electronically Signed   By: Maude Naegeli M.D.   On: 09/28/2023 09:12   CT Head Wo Contrast Result Date: 09/27/2023 CLINICAL DATA:  Headache, new onset (Age >= 51y) near syncope around 0900 this morning. Pt's husband states they were sitting at a table this morning and she became dizzy, barely responsive, and diaphoretic for roughly 30 minutes before coming back to baseline EXAM: CT HEAD WITHOUT CONTRAST TECHNIQUE: Contiguous axial images were obtained from the base of the skull through the vertex without intravenous contrast. RADIATION DOSE REDUCTION: This exam was performed according to the departmental dose-optimization program which includes automated exposure control, adjustment of the mA and/or kV according to patient size and/or use of iterative reconstruction technique. COMPARISON:  MRI head 09/23/2023 FINDINGS: Brain: Patchy and confluent areas of decreased attenuation are noted throughout the deep and periventricular white matter of the cerebral hemispheres bilaterally, compatible with chronic microvascular ischemic disease. No evidence of large-territorial acute infarction. No parenchymal hemorrhage. No mass lesion. No  extra-axial collection. No mass effect or midline shift. No hydrocephalus. Basilar cisterns are patent. Vascular: No hyperdense vessel. Skull: No acute fracture or focal lesion.  Sinuses/Orbits: Paranasal sinuses and mastoid air cells are clear. The orbits are unremarkable. Other: None. IMPRESSION: No acute intracranial abnormality. Electronically Signed   By: Morgane  Naveau M.D.   On: 09/27/2023 17:03   DG Chest Portable 1 View Result Date: 09/27/2023 CLINICAL DATA:  Chest pain EXAM: PORTABLE CHEST 1 VIEW COMPARISON:  None Available. FINDINGS: The heart size and mediastinal contours are within normal limits. Both lungs are clear. The visualized skeletal structures are unremarkable. IMPRESSION: No active disease. Electronically Signed   By: Greig Pique M.D.   On: 09/27/2023 15:34   MR BRAIN WO CONTRAST Result Date: 09/23/2023 CLINICAL DATA:  Memory loss. EXAM: MRI HEAD WITHOUT CONTRAST TECHNIQUE: Multiplanar, multiecho pulse sequences of the brain and surrounding structures were obtained without intravenous contrast. Additionally, using NeuroQuant software a 3D volumetric analysis of the brain was performed and is compared to a normative database adjusted for age, gender and intracranial volume. COMPARISON:  CT angiogram of the head dated July 05, 2023. FINDINGS: Brain: Age commensurate cerebral and cerebellar volume loss. No evidence of restricted diffusion to indicate acute or recent infarction. No evidence of hemorrhage, mass or hydrocephalus. Mild to moderate periventricular, subcortical and deep cerebral white matter disease. Developmental venous anomaly within the pons. Vascular: Normal vascular flow voids. Skull and upper cervical spine: Normal marrow signal. No osseous lesions. Sinuses/Orbits: Clear paranasal sinuses.  Normal orbits. Other: None. NeuroQuant Findings: Volumetric analysis of the brain was performed, with a fully detailed report in [facility]. Briefly, the comparison with age and  gender matched reference reveals within normal limits. IMPRESSION: 1. Age-related cerebral and cerebellar volume loss and mild to moderate cerebral white matter disease. 2. NeuroQuant volumetric analysis of the brain, see details on [employer]. Electronically Signed   By: Evalene Coho M.D.   On: 09/23/2023 19:48   Results   LABS Cholesterol: 241 mg/dL HDL: 66 mg/dL Triglycerides: 857 mg/dL LDL: 851 mg/dL Non-HDL Cholesterol: 824 mg/dL  RADIOLOGY Carotid Doppler: 65-75% stenosis (05/25/2023)      Assessment/Plan  Assessment and Plan    Cognitive Impairment  Carotid Artery Stenosis She experiences short-term memory problems and changes in affect, described as being in a fog and sleeping most of the time. There is concern for potential vascular or amyloid-related causes. Previously prescribed donepezil resulted in extreme fatigue, leading to discontinuation. Her current neurologist focuses on a plant-based lifestyle and prevention program, which may reverse or stabilize dementia with a 71% success rate, as evidenced by a small study.She has 65-75% carotid artery stenosis, raising concern for potential mini-strokes. A family history of stroke increases her risk. Referral to vascular specialists is planned for further management. CT angiogram will assess abnormal blood flow. Risks of intervention include potential complications from stenting, but monitoring is advised unless symptomatic. - Order CT scan from neck through head to assess for vascular issues and potential mini-strokes. - Consider MRI to evaluate for brain atrophy or white matter changes. - Consider referral to neuropsychology for further cognitive assessment. - Continue participation in plant-based lifestyle and prevention program. - Refer to vascular specialist for management of carotid artery stenosis. - Order CT angiogram to assess for abnormal blood flow and further vascular evaluation. - Initiate atorvastatin  therapy,  starting with 20 mg for one week, then increasing to 40 mg, to manage cholesterol and reduce risk of further vascular events. - Continue fish oil and omega-3 fatty acids to support cardiovascular health.  Hyperlipidemia Her cholesterol levels are elevated, with total cholesterol at 241 mg/dL and  LDL at 148 mg/dL. Given carotid artery stenosis, aggressive lipid management is crucial. She has previously managed cholesterol with a plant-based diet and statin therapy. Statin therapy is generally well-tolerated, with a 20% chance of muscle pain as a side effect. - Initiate atorvastatin  therapy as outlined under Carotid Artery Stenosis. - Recheck lipid panel in three months to assess response to therapy and dietary changes.  General Health Maintenance She is engaged in a plant-based lifestyle and prevention program, which has previously resulted in significant health improvements. She is interested in continuing this approach to support overall health and cognitive function. - Continue plant-based lifestyle and prevention program. - Attend monthly sessions as part of the prevention program.  Follow-up She has several follow-up appointments and tests scheduled to further evaluate her cognitive impairment and carotid artery stenosis. - Follow up with vascular specialist on April 22 for CT scan results and further management plan. - Schedule follow-up appointment in three months to reassess cholesterol levels and response to atorvastatin  therapy.        Labs/tests ordered:  * No order type specified * Next appt:  09/07/2023  I spent greater than 60 minutes for the care of this patient in face to face time, chart review, clinical documentation, patient education.

## 2023-07-01 DIAGNOSIS — M545 Low back pain, unspecified: Secondary | ICD-10-CM | POA: Diagnosis not present

## 2023-07-05 ENCOUNTER — Ambulatory Visit
Admission: RE | Admit: 2023-07-05 | Discharge: 2023-07-05 | Disposition: A | Discharge status: Home / Self Care | Source: Ambulatory Visit | Attending: Nurse Practitioner | Admitting: Nurse Practitioner

## 2023-07-05 DIAGNOSIS — I6523 Occlusion and stenosis of bilateral carotid arteries: Secondary | ICD-10-CM

## 2023-07-05 DIAGNOSIS — G319 Degenerative disease of nervous system, unspecified: Secondary | ICD-10-CM | POA: Diagnosis not present

## 2023-07-05 DIAGNOSIS — I672 Cerebral atherosclerosis: Secondary | ICD-10-CM | POA: Diagnosis not present

## 2023-07-05 DIAGNOSIS — R413 Other amnesia: Secondary | ICD-10-CM | POA: Diagnosis not present

## 2023-07-05 MED ORDER — IOHEXOL 350 MG/ML SOLN
75.0000 mL | Freq: Once | INTRAVENOUS | Status: AC | PRN
  Administered 2023-07-05: 75 mL via INTRAVENOUS

## 2023-07-17 DIAGNOSIS — E785 Hyperlipidemia, unspecified: Secondary | ICD-10-CM | POA: Insufficient documentation

## 2023-07-17 DIAGNOSIS — I6529 Occlusion and stenosis of unspecified carotid artery: Secondary | ICD-10-CM

## 2023-07-17 HISTORY — DX: Occlusion and stenosis of unspecified carotid artery: I65.29

## 2023-07-17 NOTE — Progress Notes (Signed)
 MRN : [id number]  Julia Snyder is a 78 y.o. (11-01-1945) female who presents with chief complaint of check carotid arteries.  History of Present Illness:   The patient is seen for follow up evaluation of carotid stenosis status post CT angiogram. CT scan was done 07/05/2023.  Patient reports that the test went well with no problems or complications.   The patient denies interval amaurosis fugax. There is no recent or interval TIA symptoms or focal motor deficits. There is no prior documented CVA.  The patient is taking enteric-coated aspirin 81 mg daily.  There is no history of migraine headaches. There is no history of seizures.  No recent shortening of the patient's walking distance or new symptoms consistent with claudication.  No history of rest pain symptoms. No new ulcers or wounds of the lower extremities have occurred.  There is no history of DVT, PE or superficial thrombophlebitis. No recent episodes of angina or shortness of breath documented.   CT angiogram is reviewed by me personally and shows approximately 60% stenosis of the right internal carotid artery and greater than 95% stenosis of the left internal carotid artery.  The left carotid stenosis is consistent with calcified plaque at the origin of the left internal carotid artery.     Past Medical History:  Diagnosis Date   Arthritis    Depression    Overactive bladder    Urge incontinence of urine     Past Surgical History:  Procedure Laterality Date   BREAST BIOPSY Left 2004   benign   BUNIONECTOMY Bilateral    left ~2014, right 2018   COLONOSCOPY     ELBOW FRACTURE SURGERY Bilateral    right 2019, left ~2015   FRACTURE SURGERY     KNEE ARTHROPLASTY Left 05/14/2022   Procedure: COMPUTER ASSISTED TOTAL KNEE ARTHROPLASTY - RNFA;  Surgeon: Arlyne Lame, MD;  Location: [facility];  Service: Orthopedics;  Laterality: Left;   ORIF ELBOW FRACTURE Right 12/01/2017   Procedure: OPEN  REDUCTION INTERNAL FIXATION (ORIF) ELBOW/OLECRANON FRACTURE;  Surgeon: Molli Angelucci, MD;  Location: [facility];  Service: Orthopedics;  Laterality: Right;   OTHER SURGICAL HISTORY  2015   left arm surgery with titanium plate    Social History Social History   Tobacco Use   Smoking status: Former    Types: Cigarettes    Passive exposure: Past   Smokeless tobacco: Never   Tobacco comments:    Quit date was unknown   Advertising account planner   Vaping status: Never Used  Substance Use Topics   Alcohol use: Yes    Comment: occassional   Drug use: Never    Family History Family History  Problem Relation Age of Onset   Stroke Mother    Heart disease Mother    Parkinson's disease Father    Diabetes Neg Hx    Cancer Neg Hx     Allergies  Allergen Reactions   Donepezil Other (See Comments)    Sedation.      REVIEW OF SYSTEMS (Negative unless checked)  Constitutional: [] Weight loss  [] Fever  [] Chills Cardiac: [] Chest pain   [] Chest pressure   [] Palpitations   [] Shortness of breath when laying flat   [] Shortness of breath with exertion. Vascular:  [x] Pain in legs with walking   [] Pain in legs at rest  [] History of DVT   [] Phlebitis   []   Swelling in legs   [] Varicose veins   [] Non-healing ulcers Pulmonary:   [] Uses home oxygen   [] Productive cough   [] Hemoptysis   [] Wheeze  [] COPD   [] Asthma Neurologic:  [] Dizziness   [] Seizures   [] History of stroke   [] History of TIA  [] Aphasia   [] Vissual changes   [] Weakness or numbness in arm   [] Weakness or numbness in leg Musculoskeletal:   [] Joint swelling   [] Joint pain   [] Low back pain Hematologic:  [] Easy bruising  [] Easy bleeding   [] Hypercoagulable state   [] Anemic Gastrointestinal:  [] Diarrhea   [] Vomiting  [] Gastroesophageal reflux/heartburn   [] Difficulty swallowing. Genitourinary:  [] Chronic kidney disease   [] Difficult urination  [] Frequent urination   [] Blood in urine Skin:  [] Rashes   [] Ulcers  Psychological:  [] History of anxiety   []   History of major depression.  Physical Examination  There were no vitals filed for this visit. There is no height or weight on file to calculate BMI. Gen: WD/WN, NAD Head: [place]/AT, No temporalis wasting.  Ear/Nose/Throat: Hearing grossly intact, nares w/o erythema or drainage Eyes: PER, EOMI, sclera nonicteric.  Neck: Supple, no masses.  No bruit or JVD.  Pulmonary:  Good air movement, no audible wheezing, no use of accessory muscles.  Cardiac: RRR, normal S1, S2, no Murmurs. Vascular:  carotid bruit noted Vessel Right Left  Radial Palpable Palpable  Gastrointestinal: soft, non-distended. No guarding/no peritoneal signs.  Musculoskeletal: M/S 5/5 throughout.  No visible deformity.  Neurologic: CN 2-12 intact. Pain and light touch intact in extremities.  Symmetrical.  Speech is fluent. Motor exam as listed above. Psychiatric: Judgment intact, Mood & affect appropriate for pt's clinical situation. Dermatologic: No rashes or ulcers noted.  No changes consistent with cellulitis.   CBC Lab Results  Component Value Date   WBC 5.7 03/24/2023   HGB 14.4 03/24/2023   HCT 44.1 03/24/2023   MCV 89.5 03/24/2023   PLT 339.0 03/24/2023    BMET    Component Value Date/Time   NA 139 03/24/2023 0850   K 3.9 03/24/2023 0850   CL 100 03/24/2023 0850   CO2 29 03/24/2023 0850   GLUCOSE 101 (H) 03/24/2023 0850   BUN 13 03/24/2023 0850   CREATININE 0.71 03/24/2023 0850   CALCIUM  9.5 03/24/2023 0850   GFRNONAA >60 05/03/2022 1416   CrCl cannot be calculated (Patient's most recent lab result is older than the maximum 21 days allowed.).  COAG No results found for: "INR", "PROTIME"  Radiology No results found.   Assessment/Plan 1. Bilateral carotid artery stenosis (Primary) Recommend:  The patient remains asymptomatic with respect to the carotid stenosis.  However, the patient has now progressed and has a lesion the is >75%.  Patient's CT angiography of the carotid arteries confirms  >75% left ICA stenosis.  The anatomical considerations support surgery over stenting.  This was discussed in detail with the patient.  The patient does indeed need surgery, therefore, cardiac clearance will be arranged. Once cleared the patient will be scheduled for surgery.  I have completed Shared Decision-Making with Dee Farber Orlandoprior to carotid surgery/intervention. The conversation included: -Discussion of all treatment options including carotid endarterectomy (CEA), carotid artery stenting (which includes transcarotid artery revascularization (TCAR)), and optimal medical therapy (OMT). -Explanation of risks and benefits for each option specific to [employer] clinical situation. -Integration of clinical guidelines as it relates to the patient's history and comorbidities. -Discussion and incorporation of Jenney Modest and their personal preferences and priorities in choosing  a treatment plan plan  If the patient was unable to participate in Shared Decision Making this process was done with the patient.  The patient's NIHSS score is as follows: 0 Mild: 1 - 5 Mild to Moderately Severe: 5 - 14 Severe: 15 - 24 Very Severe: >25  The risks, benefits and alternative therapies were reviewed in detail with the patient.  All questions were answered.  The patient agrees to proceed with surgery of the left carotid artery.  Continue antiplatelet therapy as prescribed. Continue management of CAD, HTN and Hyperlipidemia. Healthy heart diet, encouraged exercise at least 4 times per week.  - Ambulatory referral to Cardiology  2. Hyperlipidemia, unspecified hyperlipidemia type Continue statin as ordered and reviewed, no changes at this time  3. Primary osteoarthritis of left knee Continue medications to treat the patient's degenerative disease as already ordered, these medications have been reviewed and there are no changes at this time.  Continued activity and therapy was  stressed.    Devon Fogo, MD  07/17/2023 2:51 PM

## 2023-07-18 ENCOUNTER — Ambulatory Visit (INDEPENDENT_AMBULATORY_CARE_PROVIDER_SITE_OTHER): Admitting: Vascular Surgery

## 2023-07-18 ENCOUNTER — Encounter (INDEPENDENT_AMBULATORY_CARE_PROVIDER_SITE_OTHER): Payer: Self-pay | Admitting: Vascular Surgery

## 2023-07-18 VITALS — BP 139/83 | HR 61 | Resp 18 | Ht 63.0 in | Wt 134.4 lb

## 2023-07-18 DIAGNOSIS — E785 Hyperlipidemia, unspecified: Secondary | ICD-10-CM

## 2023-07-18 DIAGNOSIS — I6523 Occlusion and stenosis of bilateral carotid arteries: Secondary | ICD-10-CM

## 2023-07-18 DIAGNOSIS — M1712 Unilateral primary osteoarthritis, left knee: Secondary | ICD-10-CM

## 2023-07-19 ENCOUNTER — Encounter (INDEPENDENT_AMBULATORY_CARE_PROVIDER_SITE_OTHER): Admitting: Vascular Surgery

## 2023-07-21 DIAGNOSIS — I951 Orthostatic hypotension: Secondary | ICD-10-CM | POA: Diagnosis not present

## 2023-07-21 DIAGNOSIS — I6523 Occlusion and stenosis of bilateral carotid arteries: Secondary | ICD-10-CM | POA: Diagnosis not present

## 2023-08-02 ENCOUNTER — Encounter (INDEPENDENT_AMBULATORY_CARE_PROVIDER_SITE_OTHER): Payer: Self-pay

## 2023-08-05 DIAGNOSIS — I951 Orthostatic hypotension: Secondary | ICD-10-CM | POA: Diagnosis not present

## 2023-08-17 DIAGNOSIS — M8589 Other specified disorders of bone density and structure, multiple sites: Secondary | ICD-10-CM | POA: Diagnosis not present

## 2023-08-17 DIAGNOSIS — E7800 Pure hypercholesterolemia, unspecified: Secondary | ICD-10-CM | POA: Diagnosis not present

## 2023-08-17 DIAGNOSIS — F02A0 Dementia in other diseases classified elsewhere, mild, without behavioral disturbance, psychotic disturbance, mood disturbance, and anxiety: Secondary | ICD-10-CM | POA: Diagnosis not present

## 2023-08-17 DIAGNOSIS — G3109 Other frontotemporal dementia: Secondary | ICD-10-CM | POA: Diagnosis not present

## 2023-08-17 DIAGNOSIS — M810 Age-related osteoporosis without current pathological fracture: Secondary | ICD-10-CM | POA: Diagnosis not present

## 2023-08-17 LAB — HM DEXA SCAN

## 2023-08-18 DIAGNOSIS — I6523 Occlusion and stenosis of bilateral carotid arteries: Secondary | ICD-10-CM | POA: Diagnosis not present

## 2023-08-18 DIAGNOSIS — R413 Other amnesia: Secondary | ICD-10-CM | POA: Diagnosis not present

## 2023-08-18 DIAGNOSIS — I951 Orthostatic hypotension: Secondary | ICD-10-CM | POA: Diagnosis not present

## 2023-08-19 ENCOUNTER — Telehealth (INDEPENDENT_AMBULATORY_CARE_PROVIDER_SITE_OTHER): Payer: Self-pay | Admitting: Vascular Surgery

## 2023-08-19 NOTE — Telephone Encounter (Signed)
 Pt stopped by [facility] to see if we had received surgery clearance. Pt was informed that we had not received it as of now. [facility] advised pt to to get in touch with cardio office to check and see if it was sent over.

## 2023-08-20 ENCOUNTER — Encounter (INDEPENDENT_AMBULATORY_CARE_PROVIDER_SITE_OTHER): Payer: Self-pay | Admitting: Vascular Surgery

## 2023-08-22 ENCOUNTER — Telehealth (INDEPENDENT_AMBULATORY_CARE_PROVIDER_SITE_OTHER): Payer: Self-pay

## 2023-08-22 NOTE — Telephone Encounter (Signed)
 Spoke with the patient and she is scheduled with Dr. Prescilla Brod on 09/14/23 for a left carotid endarterectomy at [facility]. Pre-admit will call to make a pre-op appt at the [facility]. Pre-surgical instructions were discussed and will be sent to Mychart and mailed.

## 2023-08-31 ENCOUNTER — Encounter
Admission: RE | Admit: 2023-08-31 | Discharge: 2023-08-31 | Disposition: A | Discharge status: Home / Self Care | Source: Ambulatory Visit | Attending: Vascular Surgery | Admitting: Vascular Surgery

## 2023-08-31 ENCOUNTER — Other Ambulatory Visit: Payer: Self-pay

## 2023-08-31 ENCOUNTER — Other Ambulatory Visit (INDEPENDENT_AMBULATORY_CARE_PROVIDER_SITE_OTHER): Payer: Self-pay | Admitting: Nurse Practitioner

## 2023-08-31 VITALS — BP 125/71 | HR 66 | Temp 97.6°F | Resp 18 | Ht 63.0 in | Wt 129.9 lb

## 2023-08-31 DIAGNOSIS — I6523 Occlusion and stenosis of bilateral carotid arteries: Secondary | ICD-10-CM | POA: Insufficient documentation

## 2023-08-31 DIAGNOSIS — Z01812 Encounter for preprocedural laboratory examination: Secondary | ICD-10-CM | POA: Insufficient documentation

## 2023-08-31 LAB — BASIC METABOLIC PANEL WITH GFR
Anion gap: 10 (ref 5–15)
BUN: 9 mg/dL (ref 8–23)
CO2: 28 mmol/L (ref 22–32)
Calcium: 9.5 mg/dL (ref 8.9–10.3)
Chloride: 101 mmol/L (ref 98–111)
Creatinine, Ser: 0.61 mg/dL (ref 0.44–1.00)
GFR, Estimated: >60 mL/min (ref >60)
Glucose, Bld: 98 mg/dL (ref 70–99)
Potassium: 3.8 mmol/L (ref 3.5–5.1)
Sodium: 139 mmol/L (ref 135–145)

## 2023-08-31 LAB — TYPE AND SCREEN
ABO/RH(D): O POS
Antibody Screen: NEGATIVE

## 2023-08-31 LAB — CBC WITH DIFFERENTIAL/PLATELET
Abs Immature Granulocytes: 0.03 10*3/uL (ref 0.00–0.07)
Basophils Absolute: 0 10*3/uL (ref 0.0–0.1)
Basophils Relative: 1 %
Eosinophils Absolute: 0.1 10*3/uL (ref 0.0–0.5)
Eosinophils Relative: 3 %
HCT: 42.9 % (ref 36.0–46.0)
Hemoglobin: 13.8 g/dL (ref 12.0–15.0)
Immature Granulocytes: 1 %
Lymphocytes Relative: 33 %
Lymphs Abs: 1.7 10*3/uL (ref 0.7–4.0)
MCH: 28.6 pg (ref 26.0–34.0)
MCHC: 32.2 g/dL (ref 30.0–36.0)
MCV: 89 fL (ref 80.0–100.0)
Monocytes Absolute: 0.5 10*3/uL (ref 0.1–1.0)
Monocytes Relative: 10 %
Neutro Abs: 2.7 10*3/uL (ref 1.7–7.7)
Neutrophils Relative %: 52 %
Platelets: 308 10*3/uL (ref 150–400)
RBC: 4.82 MIL/uL (ref 3.87–5.11)
RDW: 13.8 % (ref 11.5–15.5)
WBC: 5.1 10*3/uL (ref 4.0–10.5)
nRBC: 0 % (ref 0.0–0.2)

## 2023-08-31 LAB — SURGICAL PCR SCREEN
MRSA, PCR: NEGATIVE
Staphylococcus aureus: NEGATIVE

## 2023-08-31 NOTE — Patient Instructions (Addendum)
 Your procedure is scheduled on:  [facility] JULY 2  Report to the Registration Desk on the 1st floor of the [facility] To find out your arrival time, please call [phone] between 1PM - 3PM on:  TUESDAY JULY 1  If your arrival time is 6:00 am, do not arrive before that time as the [facility] entrance doors do not open until 6:00 am.  REMEMBER: Instructions that are not followed completely may result in serious medical risk, up to and including death; or upon the discretion of your surgeon and anesthesiologist your surgery may need to be rescheduled.  Do not eat food after midnight the night before surgery.  No gum chewing or hard candies.   One week prior to surgery: [facility] JUNE 25  Stop Anti-inflammatories (NSAIDS) such as Advil, Aleve, Ibuprofen, Motrin, Naproxen, Naprosyn and Aspirin based products such as Excedrin, Goody's Powder, BC Powder. Stop ANY OVER THE COUNTER supplements until after surgery. Ascorbic Acid (VITAMIN C)  Calcium  Carbonate-Vit D-Min  Multiple Vitamins-Minerals  Omega-3 Fatty Acids (FISH OIL PO)  vitamin k  You may however, continue to take Tylenol  if needed for pain up until the day of surgery.  Continue taking all of your other prescription medications up until the day of surgery.  ON THE DAY OF SURGERY DO NOT TAKE ANY MEDICATIONS   No Alcohol for 24 hours before or after surgery.  Do not use any recreational drugs for at least a week (preferably 2 weeks) before your surgery.  Please be advised that the combination of cocaine and anesthesia may have negative outcomes, up to and including death. If you test positive for cocaine, your surgery will be cancelled.  On the morning of surgery brush your teeth with toothpaste and water, you may rinse your mouth with mouthwash if you wish. Do not swallow any toothpaste or mouthwash.  Use CHG Soap as directed on instruction sheet.  Do not wear jewelry, make-up, hairpins, clips or nail polish.  For  welded (permanent) jewelry: bracelets, anklets, waist bands, etc.  Please have this removed prior to surgery.  If it is not removed, there is a chance that hospital personnel will need to cut it off on the day of surgery.  Do not wear lotions, powders, or perfumes.   Do not shave body hair from the neck down 48 hours before surgery.  Do not bring valuables to the hospital. [facility] is not responsible for any missing/lost belongings or valuables.   Notify your doctor if there is any change in your medical condition (cold, fever, infection).  Wear comfortable clothing (specific to your surgery type) to the hospital.  After surgery, you can help prevent lung complications by doing breathing exercises.  Take deep breaths and cough every 1-2 hours. Your doctor may order a device called an Incentive Spirometer to help you take deep breaths.  If you are being admitted to the hospital overnight, leave your suitcase in the car. After surgery it may be brought to your room.  In case of increased patient census, it may be necessary for you, the patient, to continue your postoperative care in the Same Day Surgery department.  If you are being discharged the day of surgery, you will not be allowed to drive home. You will need a responsible individual to drive you home and stay with you for 24 hours after surgery.   If you are taking public transportation, you will need to have a responsible individual with you.  Please call the  Pre-admissions Testing Dept. at [phone] if you have any questions about these instructions.  Surgery Visitation Policy:  Patients having surgery or a procedure may have two visitors.  Children under the age of 82 must have an adult with them who is not the patient.  Inpatient Visitation:    Visiting hours are 7 a.m. to 8 p.m. Up to four visitors are allowed at one time in a patient room. The visitors may rotate out with other people during the day.  One visitor  age 22 or older may stay with the patient overnight and must be in the room by 8 p.m.     Preparing for Surgery with CHLORHEXIDINE  GLUCONATE (CHG) Soap  Chlorhexidine  Gluconate (CHG) Soap  o An antiseptic cleaner that kills germs and bonds with the skin to continue killing germs even after washing  o Used for showering the night before surgery and morning of surgery  Before surgery, you can play an important role by reducing the number of germs on your skin.  CHG (Chlorhexidine  gluconate) soap is an antiseptic cleanser which kills germs and bonds with the skin to continue killing germs even after washing.  Please do not use if you have an allergy to CHG or antibacterial soaps. If your skin becomes reddened/irritated stop using the CHG.  1. Shower the NIGHT BEFORE SURGERY and the MORNING OF SURGERY with CHG soap.  2. If you choose to wash your hair, wash your hair first as usual with your normal shampoo.  3. After shampooing, rinse your hair and body thoroughly to remove the shampoo.  4. Use CHG as you would any other liquid soap. You can apply CHG directly to the skin and wash gently with a scrungie or a clean washcloth.  5. Apply the CHG soap to your body only from the neck down. Do not use on open wounds or open sores. Avoid contact with your eyes, ears, mouth, and genitals (private parts). Wash face and genitals (private parts) with your normal soap.  6. Wash thoroughly, paying special attention to the area where your surgery will be performed.  7. Thoroughly rinse your body with warm water.  8. Do not shower/wash with your normal soap after using and rinsing off the CHG soap.  9. Pat yourself dry with a clean towel.  10. Wear clean pajamas to bed the night before surgery.  12. Place clean sheets on your bed the night of your first shower and do not sleep with pets.  13. Shower again with the CHG soap on the day of surgery prior to arriving at the hospital.  14. Do not  apply any deodorants/lotions/powders.  15. Please wear clean clothes to the hospital.

## 2023-09-06 DIAGNOSIS — R4189 Other symptoms and signs involving cognitive functions and awareness: Secondary | ICD-10-CM | POA: Diagnosis not present

## 2023-09-06 DIAGNOSIS — I6522 Occlusion and stenosis of left carotid artery: Secondary | ICD-10-CM | POA: Diagnosis not present

## 2023-09-06 DIAGNOSIS — R5382 Chronic fatigue, unspecified: Secondary | ICD-10-CM | POA: Diagnosis not present

## 2023-09-06 DIAGNOSIS — I672 Cerebral atherosclerosis: Secondary | ICD-10-CM | POA: Diagnosis not present

## 2023-09-06 DIAGNOSIS — Z1331 Encounter for screening for depression: Secondary | ICD-10-CM | POA: Diagnosis not present

## 2023-09-07 ENCOUNTER — Encounter: Payer: Self-pay | Admitting: Student

## 2023-09-07 ENCOUNTER — Ambulatory Visit: Admitting: Student

## 2023-09-07 VITALS — BP 136/74 | HR 72 | Temp 98.0°F | Ht 63.0 in | Wt 134.8 lb

## 2023-09-07 DIAGNOSIS — E782 Mixed hyperlipidemia: Secondary | ICD-10-CM | POA: Diagnosis not present

## 2023-09-07 DIAGNOSIS — G3184 Mild cognitive impairment, so stated: Secondary | ICD-10-CM

## 2023-09-07 DIAGNOSIS — R4189 Other symptoms and signs involving cognitive functions and awareness: Secondary | ICD-10-CM | POA: Diagnosis not present

## 2023-09-07 DIAGNOSIS — E785 Hyperlipidemia, unspecified: Secondary | ICD-10-CM | POA: Diagnosis not present

## 2023-09-07 DIAGNOSIS — R5382 Chronic fatigue, unspecified: Secondary | ICD-10-CM | POA: Diagnosis not present

## 2023-09-07 DIAGNOSIS — Z114 Encounter for screening for human immunodeficiency virus [HIV]: Secondary | ICD-10-CM | POA: Diagnosis not present

## 2023-09-07 DIAGNOSIS — I6529 Occlusion and stenosis of unspecified carotid artery: Secondary | ICD-10-CM | POA: Diagnosis not present

## 2023-09-07 NOTE — Patient Instructions (Signed)
 VISIT SUMMARY:  You had a follow-up appointment to discuss your upcoming carotid endarterectomy and other health concerns. We reviewed your recent medical tests and discussed your current medications and treatment plans.  YOUR PLAN:  -CAROTID ARTERY STENOSIS: Carotid artery stenosis is a narrowing of the carotid arteries, which can increase the risk of stroke. You are scheduled for a carotid endarterectomy on Wednesday to address this issue. We discussed potential postoperative complications, including delirium, and strategies to mitigate these risks. Your expected recovery time is about two months, and you may experience headaches due to increased blood flow post-surgery. It is important to maintain your routine and activity levels postoperatively to prevent delirium. We will also ensure a postoperative bowel regimen to prevent constipation and remove the Foley catheter and IV as soon as possible after surgery.  -OSTEOPOROSIS WITH VERTEBRAL FRACTURE: Osteoporosis is a condition where bones become weak and are more likely to break. Your vertebral compression fracture at L4 has healed without intervention. Your T-score at the right hip is -2.2, indicating osteoporosis. We decided to start you on Fosamax after your carotid surgery to avoid missing doses. We will monitor your bone density and fracture risk in 6-12 months.  -MEMORY CHANGES: You are experiencing memory changes and are undergoing evaluation with neurology, including an MRI and extensive lab tests. You previously tried donepezil but discontinued it due to adverse effects. We are considering starting you on memantine (Namenda) after your MRI results are available. Memantine may help slow the progression of memory changes and reduce anxiety associated with memory loss. We will potentially trial memantine in 3 months after further evaluation by neurology.  -HYPERLIPIDEMIA: Hyperlipidemia is a condition where there are high levels of fats (lipids)  in the blood. You are currently managing this with atorvastatin  40 mg daily. Your recent lab work did not include cholesterol levels, so we will order a cholesterol panel in September, which will be covered by insurance. Your plant-based diet may also help improve your lipid levels.  -GENERAL HEALTH MAINTENANCE: You have not completed your hepatitis B vaccination series, having received only two doses in 2013. It is recommended to complete the series at a pharmacy, but there is no urgency to do so before your surgery.  INSTRUCTIONS:  Proceed with your carotid endarterectomy as scheduled on Wednesday. After surgery, start taking Fosamax for your osteoporosis. Continue taking atorvastatin  40 mg daily for hyperlipidemia, and we will order a cholesterol panel in September. Await MRI results and further evaluation by neurology for your memory changes, and consider starting memantine in 3 months based on their recommendation. Complete your hepatitis B vaccination series at a pharmacy when convenient.

## 2023-09-07 NOTE — Progress Notes (Addendum)
 Location:  [facility] POS: [facility] Provider: ABDUL  Code Status: Full Code Goals of Care:     09/27/2023   11:51 AM  Advanced Directives  Does Patient Have a Medical Advance Directive? Yes     Chief Complaint  Patient presents with   Medical Management of Chronic Issues    Medical Management of Chronic Issues. 2 Month follow up. Having surgery 7/2 on Artery in Left side Neck.     HPI: Patient is a 78 y.o. female seen today for medical management of chronic diseases.   Discussed the use of AI scribe software for clinical note transcription with the patient, who gave verbal consent to proceed.  History of Present Illness   Julia Snyder is a 78 year old female with osteoporosis who presents for follow-up and pre-operative evaluation for carotid endarterectomy.  She has been attending multiple medical appointments, including a recent visit to neurology where extensive testing was conducted. She is scheduled for an MRI and has had blood tests showing normal iron and vitamin B12 levels. She is currently preparing for a carotid endarterectomy scheduled for Wednesday.  She has a history of a vertebral compression fracture, which healed without surgical intervention. Osteoporosis screening showed a T-score of -2.2 at the right hip. She was prescribed Fosamax but has not started it yet, planning to begin after her upcoming surgery. She also received a one-month prescription for a nasal calcium  spray.  Her current medications include atorvastatin  40 mg daily. She takes various vitamins but was advised to stop them temporarily. She has not completed her hepatitis B vaccination series, having received only two doses in 2013.  She participates in a cognitive program with daily exercises and monthly group meetings. She previously tried donepezil but discontinued it due to adverse effects during a viral illness. There is consideration for starting memantine in the future.  Her family  history is significant for her mother having died of a stroke, which raises concerns about her own cerebrovascular health.  No acid reflux.         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   Orthostatic hypotension 11/22/2022   Overactive bladder    Urge incontinence of urine    Vertigo 04/11/2018    Past Surgical History:  Procedure Laterality Date   BREAST BIOPSY Left 2004   benign   BUNIONECTOMY Bilateral    left ~2014, right 2018   COLONOSCOPY     ELBOW FRACTURE SURGERY Bilateral    right 2019, left ~2015   ENDARTERECTOMY Left 09/14/2023   Procedure: ENDARTERECTOMY, CAROTID;  Surgeon: Jama Cordella MATSU, MD;  Location: [facility];  Service: Vascular;  Laterality: Left;   FRACTURE SURGERY     KNEE ARTHROPLASTY Left 05/14/2022   Procedure: COMPUTER ASSISTED TOTAL KNEE ARTHROPLASTY - RNFA;  Surgeon: Mardee Lynwood SQUIBB, MD;  Location: [facility];  Service: Orthopedics;  Laterality: Left;   ORIF ELBOW FRACTURE Right 12/01/2017   Procedure: OPEN REDUCTION INTERNAL FIXATION (ORIF) ELBOW/OLECRANON FRACTURE;  Surgeon: Kathlynn Sharper, MD;  Location: [facility];  Service: Orthopedics;  Laterality: Right;   OTHER SURGICAL HISTORY  2015   left arm surgery with titanium plate    Allergies  Allergen Reactions   Donepezil Other (  See Comments)    Sedation.     Outpatient Encounter Medications as of 09/07/2023  Medication Sig   Ascorbic Acid (VITAMIN C) 1000 MG tablet Take 1,000 mg by mouth daily.   Calcium  Carbonate-Vit D-Min (CALCIUM  1200 PO) Take 1,200 mg by mouth daily.   Cholecalciferol 125 MCG (5000 UT) TABS Take 5,000 Units by mouth daily.   Cyanocobalamin  (B-12 PO) Take 2,000 mcg by mouth daily.   Multiple Vitamins-Minerals (MULTIPLE VITAMINS/WOMENS PO)  Take 1 tablet by mouth daily.   Omega-3 Fatty Acids (FISH OIL PO) Take 2,500 mg by mouth daily.   vitamin k 100 MCG tablet Take 100 mcg by mouth daily.   [DISCONTINUED] atorvastatin  (LIPITOR) 40 MG tablet Take 1 tablet (40 mg total) by mouth daily. Take 1/2 tablet for 1 week then increase to full tablet (Patient taking differently: Take 40 mg by mouth daily.)   alendronate (FOSAMAX) 70 MG tablet Take 70 mg by mouth once a week.   [DISCONTINUED] calcitonin, salmon, (MIACALCIN/FORTICAL) 200 UNIT/ACT nasal spray TAKE 1 SPRAY IN NOSTRIL DAILY (Patient not taking: No sig reported)   No facility-administered encounter medications on file as of 09/07/2023.    Review of Systems:  Review of Systems  Health Maintenance  Topic Date Due   Hepatitis B Vaccines (3 of 3 - 19+ 3-dose series) 11/20/2012   COVID-19 Vaccine (5 - 2024-25 season) 12/13/2023 (Originally 06/09/2023)   INFLUENZA VACCINE  10/14/2023   Medicare Annual Wellness (AWV)  03/23/2024   DTaP/Tdap/Td (2 - Td or Tdap) 11/30/2027   Pneumococcal Vaccine: 50+ Years  Completed   DEXA SCAN  Completed   Hepatitis C Screening  Completed   Zoster Vaccines- Shingrix  Completed   HPV VACCINES  Aged Out   Meningococcal B Vaccine  Aged Out   Colonoscopy  Discontinued    Physical Exam: Vitals:   09/07/23 1432  BP: 136/74  Pulse: 72  Temp: 98 F (36.7 C)  SpO2: 98%  Weight: 134 lb 12.8 oz (61.1 kg)  Height: 5' 3 (1.6 m)   Body mass index is 23.88 kg/m. Physical Exam Constitutional:      Appearance: Normal appearance.  Cardiovascular:     Rate and Rhythm: Normal rate and regular rhythm.     Pulses: Normal pulses.     Heart sounds: Normal heart sounds.  Pulmonary:     Effort: Pulmonary effort is normal.  Abdominal:     General: Abdomen is flat. Bowel sounds are normal.     Palpations: Abdomen is soft.  Musculoskeletal:        General: No swelling or tenderness.  Skin:    General: Skin is warm and dry.  Neurological:      Mental Status: She is alert.     Gait: Gait normal.  Psychiatric:        Mood and Affect: Mood normal.    Labs reviewed: Basic Metabolic Panel: Recent Labs    03/24/23 0850 08/31/23 0852 09/15/23 0548 09/27/23 1152 09/28/23 0356  NA 139   < > 139 135 138  K 3.9   < > 3.6 4.4 3.8  CL 100   < > 105 98 104  CO2 29   < > 26 26 25   GLUCOSE 101*   < > 104* 138* 101*  BUN 13   < > 9 14 15   CREATININE 0.71   < > 0.57 0.65 0.61  CALCIUM  9.5   < > 8.5* 9.3 9.0  TSH 1.47  --   --  0.709  --    < > = values in this interval not displayed.   Liver Function Tests: Recent Labs    03/24/23 0850 09/27/23 1152 09/28/23 0356  AST 22 27 27   ALT 17 28 24   ALKPHOS 93 97 95  BILITOT 0.6 0.4 0.5  PROT 6.8 6.9 6.4*  ALBUMIN 4.5 4.1 3.7   No results for input(s): LIPASE, AMYLASE in the last 8760 hours. No results for input(s): AMMONIA in the last 8760 hours. CBC: Recent Labs    08/31/23 0852 09/15/23 0548 09/27/23 1152 09/28/23 0356  WBC 5.1 7.8 7.0 6.1  NEUTROABS 2.7  --   --   --   HGB 13.8 11.8* 13.0 12.3  HCT 42.9 36.4 40.6 38.4  MCV 89.0 90.8 90.4 89.5  PLT 308 237 366 318   Lipid Panel: Recent Labs    05/19/23 0735  CHOL 241*  HDL 66  LDLCALC 148*  TRIG 142  CHOLHDL 3.7   No results found for: HGBA1C  Procedures since last visit: US  Carotid Bilateral Result Date: 09/28/2023 CLINICAL DATA:  Near syncope EXAM: BILATERAL CAROTID DUPLEX ULTRASOUND TECHNIQUE: Elnor scale imaging, color Doppler and duplex ultrasound were performed of bilateral carotid and vertebral arteries in the neck. COMPARISON:  CT angiogram of the neck performed July 05, 2023 FINDINGS: Criteria: Quantification of carotid stenosis is based on velocity parameters that correlate the residual internal carotid diameter with NASCET-based stenosis levels, using the diameter of the distal internal carotid lumen as the denominator for stenosis measurement. The following velocity measurements were  obtained: RIGHT ICA: 87 cm/sec CCA: 91 cm/sec SYSTOLIC ICA/CCA RATIO:  1.0 ECA: 144 cm/sec LEFT ICA: 115 cm/sec CCA: 64 cm/sec SYSTOLIC ICA/CCA RATIO:  1.8 ECA: 57 cm/sec RIGHT CAROTID ARTERY: Mild atherosclerotic changes, patent. RIGHT VERTEBRAL ARTERY:  Antegrade. LEFT CAROTID ARTERY: No significant stenosis is appreciated, antegrade flow. LEFT VERTEBRAL ARTERY:  Antegrade. IMPRESSION: 1. Right carotid system: Mild atherosclerotic changes in the proximal right internal carotid artery is estimated stenosis measuring less than 50%. 2. Left carotid system: No evidence of significant atherosclerotic change or flow-limiting stenosis. Within normal limits. Electronically Signed   By: Maude Naegeli M.D.   On: 09/28/2023 09:12   CT Head Wo Contrast Result Date: 09/27/2023 CLINICAL DATA:  Headache, new onset (Age >= 51y) near syncope around 0900 this morning. Pt's husband states they were sitting at a table this morning and she became dizzy, barely responsive, and diaphoretic for roughly 30 minutes before coming back to baseline EXAM: CT HEAD WITHOUT CONTRAST TECHNIQUE: Contiguous axial images were obtained from the base of the skull through the vertex without intravenous contrast. RADIATION DOSE REDUCTION: This exam was performed according to the departmental dose-optimization program which includes automated exposure control, adjustment of the mA and/or kV according to patient size and/or use of iterative reconstruction technique. COMPARISON:  MRI head 09/23/2023 FINDINGS: Brain: Patchy and confluent areas of decreased attenuation are noted throughout the deep and periventricular white matter of the cerebral hemispheres bilaterally, compatible with chronic microvascular ischemic disease. No evidence of large-territorial acute infarction. No parenchymal hemorrhage. No mass lesion. No extra-axial collection. No mass effect or midline shift. No hydrocephalus. Basilar cisterns are patent. Vascular: No hyperdense vessel.  Skull: No acute fracture or focal lesion. Sinuses/Orbits: Paranasal sinuses and mastoid air cells are clear. The orbits are unremarkable. Other: None. IMPRESSION: No acute intracranial abnormality. Electronically Signed   By: Morgane  Naveau M.D.   On: 09/27/2023 17:03   DG Chest Portable 1  View Result Date: 09/27/2023 CLINICAL DATA:  Chest pain EXAM: PORTABLE CHEST 1 VIEW COMPARISON:  None Available. FINDINGS: The heart size and mediastinal contours are within normal limits. Both lungs are clear. The visualized skeletal structures are unremarkable. IMPRESSION: No active disease. Electronically Signed   By: Greig Pique M.D.   On: 09/27/2023 15:34   MR BRAIN WO CONTRAST Result Date: 09/23/2023 CLINICAL DATA:  Memory loss. EXAM: MRI HEAD WITHOUT CONTRAST TECHNIQUE: Multiplanar, multiecho pulse sequences of the brain and surrounding structures were obtained without intravenous contrast. Additionally, using NeuroQuant software a 3D volumetric analysis of the brain was performed and is compared to a normative database adjusted for age, gender and intracranial volume. COMPARISON:  CT angiogram of the head dated July 05, 2023. FINDINGS: Brain: Age commensurate cerebral and cerebellar volume loss. No evidence of restricted diffusion to indicate acute or recent infarction. No evidence of hemorrhage, mass or hydrocephalus. Mild to moderate periventricular, subcortical and deep cerebral white matter disease. Developmental venous anomaly within the pons. Vascular: Normal vascular flow voids. Skull and upper cervical spine: Normal marrow signal. No osseous lesions. Sinuses/Orbits: Clear paranasal sinuses.  Normal orbits. Other: None. NeuroQuant Findings: Volumetric analysis of the brain was performed, with a fully detailed report in [facility]. Briefly, the comparison with age and gender matched reference reveals within normal limits. IMPRESSION: 1. Age-related cerebral and cerebellar volume loss and mild to moderate  cerebral white matter disease. 2. NeuroQuant volumetric analysis of the brain, see details on [employer]. Electronically Signed   By: Evalene Coho M.D.   On: 09/23/2023 19:48   Results   LABS Iron: 75 g/dL (93/74/7974) Vitamin B12: 1200 pg/mL (09/07/2023)  RADIOLOGY DEXA scan: Right hip T score -2.2, L4 vertebra compression CT scan: Normal  DIAGNOSTIC Echocardiogram: Normal      Assessment/Plan     Carotid artery stenosis Scheduled for carotid endarterectomy on Wednesday. Cleared by cardiology with echocardiogram showing no issues. Discussed potential postoperative complications, including delirium due to anesthesia, and strategies to mitigate these risks. Expected recovery time is approximately two months, with potential for headaches due to increased blood flow post-surgery. Emphasized the importance of maintaining routine and activity postoperatively to prevent delirium. Undergoing evaluation with neurology, including MRI and extensive lab tests. Previous trial of donepezil was discontinued due to adverse effects. Consideration of starting memantine (Namenda) after MRI results are available. Memantine may help slow progression of memory changes and reduce anxiety associated with memory loss. Decision to potentially trial memantine in 3 months after further evaluation by neurology. - Proceed with carotid endarterectomy - Monitor for postoperative delirium and manage with routine and activity - Ensure postoperative bowel regimen to prevent constipation - Remove Foley catheter and IV as soon as possible post-surgery - Await MRI results and further evaluation by neurology - Consider starting memantine in 3 months based on neurology's recommendation  Osteoporosis  Possible vertebral compression fracture at L4 has healed without intervention. T-score at right hip is -2.2, indicating osteoporosis. Overall risk of major osteoporotic fracture in the next ten years is 22%, and hip  fracture risk is 6.1%. Treatment with bisphosphonates is indicated. No history of acid reflux, making Fosamax a suitable option. Decision to delay starting Fosamax until after upcoming carotid surgery to avoid missing doses. - Start Fosamax after carotid surgery - Monitor bone density and fracture risk in 6-12 months  Hyperlipidemia Managed with atorvastatin  40 mg daily. Recent lab work did not include cholesterol levels. Next cholesterol panel is due in September, which  will be covered by insurance. She is on a plant-based diet, which may contribute to improved lipid levels. - Continue atorvastatin  40 mg daily - Order cholesterol panel in September  General Health Maintenance Incomplete hepatitis B vaccination series. Recommended to complete the series at a pharmacy. No urgency to complete before surgery. - Complete hepatitis B vaccination series at pharmacy          Labs/tests ordered:  * No order type specified * Next appt:  12/09/2023

## 2023-09-12 ENCOUNTER — Encounter: Payer: Self-pay | Admitting: Vascular Surgery

## 2023-09-14 ENCOUNTER — Encounter: Payer: Self-pay | Admitting: Urgent Care

## 2023-09-14 ENCOUNTER — Encounter: Payer: Self-pay | Admitting: Vascular Surgery

## 2023-09-14 ENCOUNTER — Other Ambulatory Visit: Payer: Self-pay

## 2023-09-14 ENCOUNTER — Inpatient Hospital Stay
Admission: RE | Admit: 2023-09-14 | Discharge: 2023-09-15 | DRG: 039 | Disposition: A | Discharge status: Home / Self Care | Attending: Vascular Surgery | Admitting: Vascular Surgery

## 2023-09-14 ENCOUNTER — Encounter
Admission: RE | Disposition: A | Discharge status: Home / Self Care | Payer: Self-pay | Source: Home / Self Care | Attending: Vascular Surgery

## 2023-09-14 ENCOUNTER — Inpatient Hospital Stay: Payer: Self-pay | Admitting: Urgent Care

## 2023-09-14 DIAGNOSIS — Z79899 Other long term (current) drug therapy: Secondary | ICD-10-CM | POA: Diagnosis not present

## 2023-09-14 DIAGNOSIS — Z96652 Presence of left artificial knee joint: Secondary | ICD-10-CM | POA: Diagnosis present

## 2023-09-14 DIAGNOSIS — I10 Essential (primary) hypertension: Secondary | ICD-10-CM | POA: Diagnosis present

## 2023-09-14 DIAGNOSIS — N3281 Overactive bladder: Secondary | ICD-10-CM | POA: Diagnosis not present

## 2023-09-14 DIAGNOSIS — Z8249 Family history of ischemic heart disease and other diseases of the circulatory system: Secondary | ICD-10-CM

## 2023-09-14 DIAGNOSIS — I6529 Occlusion and stenosis of unspecified carotid artery: Principal | ICD-10-CM | POA: Diagnosis present

## 2023-09-14 DIAGNOSIS — I2510 Atherosclerotic heart disease of native coronary artery without angina pectoris: Secondary | ICD-10-CM | POA: Diagnosis present

## 2023-09-14 DIAGNOSIS — M1712 Unilateral primary osteoarthritis, left knee: Secondary | ICD-10-CM | POA: Diagnosis present

## 2023-09-14 DIAGNOSIS — Z87891 Personal history of nicotine dependence: Secondary | ICD-10-CM | POA: Diagnosis not present

## 2023-09-14 DIAGNOSIS — I6522 Occlusion and stenosis of left carotid artery: Secondary | ICD-10-CM | POA: Diagnosis not present

## 2023-09-14 DIAGNOSIS — I739 Peripheral vascular disease, unspecified: Secondary | ICD-10-CM | POA: Diagnosis present

## 2023-09-14 DIAGNOSIS — Z820 Family history of epilepsy and other diseases of the nervous system: Secondary | ICD-10-CM

## 2023-09-14 DIAGNOSIS — Z7982 Long term (current) use of aspirin: Secondary | ICD-10-CM

## 2023-09-14 DIAGNOSIS — Z823 Family history of stroke: Secondary | ICD-10-CM

## 2023-09-14 DIAGNOSIS — F32A Depression, unspecified: Secondary | ICD-10-CM | POA: Diagnosis present

## 2023-09-14 DIAGNOSIS — E785 Hyperlipidemia, unspecified: Secondary | ICD-10-CM | POA: Diagnosis present

## 2023-09-14 HISTORY — PX: ENDARTERECTOMY: SHX5162

## 2023-09-14 HISTORY — DX: Occlusion and stenosis of bilateral carotid arteries: I65.23

## 2023-09-14 LAB — GLUCOSE, CAPILLARY: Glucose-Capillary: 125 mg/dL — ABNORMAL HIGH (ref 70–99)

## 2023-09-14 SURGERY — ENDARTERECTOMY, CAROTID
Anesthesia: General | Site: Neck | Laterality: Left

## 2023-09-14 MED ORDER — LABETALOL HCL 5 MG/ML IV SOLN: 10.0000 mg | INTRAVENOUS | Status: DC | PRN

## 2023-09-14 MED ORDER — SODIUM CHLORIDE (PF) 0.9 % IJ SOLN
INTRAMUSCULAR | Status: AC
  Filled 2023-09-14: qty 10

## 2023-09-14 MED ORDER — MORPHINE SULFATE (PF) 2 MG/ML IV SOLN: 2.0000 mg | INTRAVENOUS | Status: DC | PRN

## 2023-09-14 MED ORDER — GLYCOPYRROLATE 0.2 MG/ML IJ SOLN
INTRAMUSCULAR | Status: DC | PRN
  Administered 2023-09-14: .2 mg via INTRAVENOUS

## 2023-09-14 MED ORDER — ASPIRIN 81 MG PO TBEC
81.0000 mg | DELAYED_RELEASE_TABLET | Freq: Every day | ORAL | Status: DC
  Administered 2023-09-15: 81 mg via ORAL
  Filled 2023-09-14: qty 1

## 2023-09-14 MED ORDER — LIDOCAINE HCL (PF) 1 % IJ SOLN
INTRAMUSCULAR | Status: AC
Start: 2023-09-14 — End: 2023-09-14
  Filled 2023-09-14: qty 30

## 2023-09-14 MED ORDER — SODIUM CHLORIDE 0.9 % IV SOLN
500.0000 mL | Freq: Once | INTRAVENOUS | Status: DC | PRN
Start: 2023-09-14 — End: 2023-09-15

## 2023-09-14 MED ORDER — PHENYLEPHRINE 80 MCG/ML (10ML) SYRINGE FOR IV PUSH (FOR BLOOD PRESSURE SUPPORT)
PREFILLED_SYRINGE | INTRAVENOUS | Status: AC
  Filled 2023-09-14: qty 10

## 2023-09-14 MED ORDER — HEPARIN SODIUM (PORCINE) 1000 UNIT/ML IJ SOLN
INTRAMUSCULAR | Status: DC | PRN
  Administered 2023-09-14: 7000 [IU] via INTRAVENOUS

## 2023-09-14 MED ORDER — HYDROMORPHONE HCL 1 MG/ML IJ SOLN: 1.0000 mg | Freq: Once | INTRAMUSCULAR | Status: DC | PRN

## 2023-09-14 MED ORDER — OXYCODONE HCL 5 MG PO TABS: 5.0000 mg | ORAL_TABLET | Freq: Once | ORAL | Status: DC | PRN

## 2023-09-14 MED ORDER — HYDROMORPHONE HCL 1 MG/ML IJ SOLN
INTRAMUSCULAR | Status: AC
Start: 2023-09-14 — End: 2023-09-14
  Filled 2023-09-14: qty 1

## 2023-09-14 MED ORDER — CHLORHEXIDINE GLUCONATE CLOTH 2 % EX PADS
6.0000 | MEDICATED_PAD | Freq: Once | CUTANEOUS | Status: AC
  Administered 2023-09-14: 6 via TOPICAL

## 2023-09-14 MED ORDER — FENTANYL CITRATE (PF) 100 MCG/2ML IJ SOLN
INTRAMUSCULAR | Status: DC | PRN
  Administered 2023-09-14 (×2): 50 ug via INTRAVENOUS
  Administered 2023-09-14 (×2): 25 ug via INTRAVENOUS

## 2023-09-14 MED ORDER — CHLORHEXIDINE GLUCONATE CLOTH 2 % EX PADS
6.0000 | MEDICATED_PAD | Freq: Once | CUTANEOUS | Status: AC
Start: 2023-09-14 — End: 2023-09-14
  Administered 2023-09-14: 6 via TOPICAL

## 2023-09-14 MED ORDER — ONDANSETRON HCL 4 MG/2ML IJ SOLN
INTRAMUSCULAR | Status: AC
  Filled 2023-09-14: qty 2

## 2023-09-14 MED ORDER — ACETAMINOPHEN 325 MG PO TABS: 325.0000 mg | ORAL_TABLET | ORAL | Status: DC | PRN

## 2023-09-14 MED ORDER — ONDANSETRON HCL 4 MG/2ML IJ SOLN: 4.0000 mg | Freq: Four times a day (QID) | INTRAMUSCULAR | Status: DC | PRN

## 2023-09-14 MED ORDER — HEPARIN SODIUM (PORCINE) 1000 UNIT/ML IJ SOLN
INTRAMUSCULAR | Status: AC
  Filled 2023-09-14: qty 10

## 2023-09-14 MED ORDER — PROPOFOL 10 MG/ML IV BOLUS
INTRAVENOUS | Status: AC
  Filled 2023-09-14: qty 40

## 2023-09-14 MED ORDER — LIDOCAINE HCL (CARDIAC) PF 100 MG/5ML IV SOSY
PREFILLED_SYRINGE | INTRAVENOUS | Status: DC | PRN
  Administered 2023-09-14: 60 mg via INTRAVENOUS

## 2023-09-14 MED ORDER — LACTATED RINGERS IV SOLN: INTRAVENOUS | Status: DC

## 2023-09-14 MED ORDER — ORAL CARE MOUTH RINSE: 15.0000 mL | OROMUCOSAL | Status: DC | PRN

## 2023-09-14 MED ORDER — CEFAZOLIN SODIUM-DEXTROSE 2-4 GM/100ML-% IV SOLN
2.0000 g | Freq: Three times a day (TID) | INTRAVENOUS | Status: AC
  Administered 2023-09-14 (×2): 2 g via INTRAVENOUS
  Filled 2023-09-14 (×2): qty 100

## 2023-09-14 MED ORDER — SODIUM CHLORIDE 0.9 % IR SOLN: Status: DC | PRN

## 2023-09-14 MED ORDER — OXIDIZED CELLULOSE EX PADS
MEDICATED_PAD | CUTANEOUS | Status: DC | PRN
  Administered 2023-09-14: 1 via TOPICAL

## 2023-09-14 MED ORDER — CEFAZOLIN SODIUM-DEXTROSE 2-4 GM/100ML-% IV SOLN
2.0000 g | INTRAVENOUS | Status: AC
  Administered 2023-09-14: 2 g via INTRAVENOUS

## 2023-09-14 MED ORDER — CHLORHEXIDINE GLUCONATE 0.12 % MT SOLN
OROMUCOSAL | Status: AC
  Filled 2023-09-14: qty 15

## 2023-09-14 MED ORDER — ONDANSETRON HCL 4 MG/2ML IJ SOLN
INTRAMUSCULAR | Status: DC | PRN
  Administered 2023-09-14: 4 mg via INTRAVENOUS

## 2023-09-14 MED ORDER — PHENOL 1.4 % MT LIQD: 1.0000 | OROMUCOSAL | Status: DC | PRN

## 2023-09-14 MED ORDER — HEPARIN SODIUM (PORCINE) 5000 UNIT/ML IJ SOLN
INTRAMUSCULAR | Status: AC
  Filled 2023-09-14: qty 1

## 2023-09-14 MED ORDER — HYDRALAZINE HCL 20 MG/ML IJ SOLN: 5.0000 mg | INTRAMUSCULAR | Status: DC | PRN

## 2023-09-14 MED ORDER — PHENYLEPHRINE HCL-NACL 20-0.9 MG/250ML-% IV SOLN
INTRAVENOUS | Status: AC
Start: 2023-09-14 — End: 2023-09-14
  Filled 2023-09-14: qty 250

## 2023-09-14 MED ORDER — LACTATED RINGERS IV SOLN: INTRAVENOUS | Status: DC | PRN

## 2023-09-14 MED ORDER — POTASSIUM CHLORIDE CRYS ER 20 MEQ PO TBCR: 40.0000 meq | EXTENDED_RELEASE_TABLET | Freq: Every day | ORAL | Status: DC | PRN

## 2023-09-14 MED ORDER — LIDOCAINE HCL (PF) 2 % IJ SOLN
INTRAMUSCULAR | Status: AC
  Filled 2023-09-14: qty 5

## 2023-09-14 MED ORDER — SODIUM CHLORIDE 0.9 % IV SOLN
INTRAVENOUS | Status: DC | PRN
  Administered 2023-09-14: 501 mL

## 2023-09-14 MED ORDER — HEPARIN 5000 UNITS IN NS 1000 ML (FLUSH): INTRAMUSCULAR | Status: DC | PRN

## 2023-09-14 MED ORDER — NITROGLYCERIN IN D5W 200-5 MCG/ML-% IV SOLN
5.0000 ug/min | INTRAVENOUS | Status: DC
  Filled 2023-09-14: qty 250

## 2023-09-14 MED ORDER — ROCURONIUM BROMIDE 10 MG/ML (PF) SYRINGE
PREFILLED_SYRINGE | INTRAVENOUS | Status: AC
  Filled 2023-09-14: qty 10

## 2023-09-14 MED ORDER — BSS IO SOLN
15.0000 mL | Freq: Once | INTRAOCULAR | Status: AC
  Administered 2023-09-14: 15 mL
  Filled 2023-09-14: qty 15

## 2023-09-14 MED ORDER — SODIUM CHLORIDE 0.9 % IV SOLN: INTRAVENOUS | Status: DC

## 2023-09-14 MED ORDER — FENTANYL CITRATE (PF) 100 MCG/2ML IJ SOLN
INTRAMUSCULAR | Status: AC
Start: 2023-09-14 — End: 2023-09-14
  Filled 2023-09-14: qty 2

## 2023-09-14 MED ORDER — SUCCINYLCHOLINE CHLORIDE 200 MG/10ML IV SOSY
PREFILLED_SYRINGE | INTRAVENOUS | Status: AC
Start: 2023-09-14 — End: 2023-09-14
  Filled 2023-09-14: qty 10

## 2023-09-14 MED ORDER — OXYCODONE HCL 5 MG PO TABS
5.0000 mg | ORAL_TABLET | ORAL | Status: DC | PRN
  Administered 2023-09-14: 5 mg via ORAL
  Filled 2023-09-14: qty 1

## 2023-09-14 MED ORDER — PHENYLEPHRINE HCL-NACL 20-0.9 MG/250ML-% IV SOLN
INTRAVENOUS | Status: DC | PRN
  Administered 2023-09-14: 20 ug/min via INTRAVENOUS

## 2023-09-14 MED ORDER — OXYCODONE HCL 5 MG/5ML PO SOLN: 5.0000 mg | Freq: Once | ORAL | Status: DC | PRN

## 2023-09-14 MED ORDER — ORAL CARE MOUTH RINSE
15.0000 mL | Freq: Once | OROMUCOSAL | Status: AC
Start: 2023-09-14 — End: 2023-09-14

## 2023-09-14 MED ORDER — STERILE WATER FOR IRRIGATION IR SOLN
Status: DC | PRN
  Administered 2023-09-14: 1000 mL

## 2023-09-14 MED ORDER — SUGAMMADEX SODIUM 200 MG/2ML IV SOLN
INTRAVENOUS | Status: DC | PRN
  Administered 2023-09-14: 200 mg via INTRAVENOUS

## 2023-09-14 MED ORDER — ONDANSETRON HCL 4 MG/2ML IJ SOLN
4.0000 mg | Freq: Four times a day (QID) | INTRAMUSCULAR | Status: DC | PRN
Start: 2023-09-14 — End: 2023-09-15

## 2023-09-14 MED ORDER — PROPOFOL 10 MG/ML IV BOLUS
INTRAVENOUS | Status: DC | PRN
  Administered 2023-09-14: 20 mg via INTRAVENOUS
  Administered 2023-09-14: 100 mg via INTRAVENOUS
  Administered 2023-09-14: 30 mg via INTRAVENOUS

## 2023-09-14 MED ORDER — EPHEDRINE 5 MG/ML INJ
INTRAVENOUS | Status: AC
Start: 2023-09-14 — End: 2023-09-14
  Filled 2023-09-14: qty 5

## 2023-09-14 MED ORDER — DOCUSATE SODIUM 100 MG PO CAPS
100.0000 mg | ORAL_CAPSULE | Freq: Every day | ORAL | Status: DC
  Administered 2023-09-15: 100 mg via ORAL
  Filled 2023-09-14: qty 1

## 2023-09-14 MED ORDER — ACETAMINOPHEN 325 MG RE SUPP: 325.0000 mg | RECTAL | Status: DC | PRN

## 2023-09-14 MED ORDER — CHLORHEXIDINE GLUCONATE CLOTH 2 % EX PADS
6.0000 | MEDICATED_PAD | Freq: Every day | CUTANEOUS | Status: DC
  Administered 2023-09-15: 6 via TOPICAL

## 2023-09-14 MED ORDER — ROCURONIUM BROMIDE 100 MG/10ML IV SOLN
INTRAVENOUS | Status: DC | PRN
  Administered 2023-09-14: 10 mg via INTRAVENOUS
  Administered 2023-09-14: 40 mg via INTRAVENOUS
  Administered 2023-09-14: 10 mg via INTRAVENOUS

## 2023-09-14 MED ORDER — KETOROLAC TROMETHAMINE 0.5 % OP SOLN
1.0000 [drp] | Freq: Four times a day (QID) | OPHTHALMIC | Status: DC
  Administered 2023-09-14 – 2023-09-15 (×3): 1 [drp] via OPHTHALMIC
  Filled 2023-09-14: qty 3

## 2023-09-14 MED ORDER — CEFAZOLIN SODIUM-DEXTROSE 2-4 GM/100ML-% IV SOLN
INTRAVENOUS | Status: AC
  Filled 2023-09-14: qty 100

## 2023-09-14 MED ORDER — FENTANYL CITRATE (PF) 100 MCG/2ML IJ SOLN
25.0000 ug | INTRAMUSCULAR | Status: DC | PRN
  Administered 2023-09-14 (×3): 25 ug via INTRAVENOUS

## 2023-09-14 MED ORDER — HYDROMORPHONE HCL 1 MG/ML IJ SOLN
INTRAMUSCULAR | Status: DC | PRN
  Administered 2023-09-14: .4 mg via INTRAVENOUS
  Administered 2023-09-14: .2 mg via INTRAVENOUS
  Administered 2023-09-14: .4 mg via INTRAVENOUS

## 2023-09-14 MED ORDER — FENTANYL CITRATE (PF) 100 MCG/2ML IJ SOLN
INTRAMUSCULAR | Status: AC
  Filled 2023-09-14: qty 2

## 2023-09-14 MED ORDER — HEMOSTATIC AGENTS (NO CHARGE) OPTIME
TOPICAL | Status: DC | PRN
  Administered 2023-09-14: 1 via TOPICAL

## 2023-09-14 MED ORDER — GLYCOPYRROLATE 0.2 MG/ML IJ SOLN
INTRAMUSCULAR | Status: AC
Start: 2023-09-14 — End: 2023-09-14
  Filled 2023-09-14: qty 1

## 2023-09-14 MED ORDER — CHLORHEXIDINE GLUCONATE 0.12 % MT SOLN
15.0000 mL | Freq: Once | OROMUCOSAL | Status: AC
  Administered 2023-09-14: 15 mL via OROMUCOSAL

## 2023-09-14 MED ORDER — DEXAMETHASONE SODIUM PHOSPHATE 10 MG/ML IJ SOLN
INTRAMUSCULAR | Status: AC
  Filled 2023-09-14: qty 1

## 2023-09-14 MED ORDER — METOPROLOL TARTRATE 5 MG/5ML IV SOLN: 2.5000 mg | INTRAVENOUS | Status: DC | PRN

## 2023-09-14 MED ORDER — VASHE WOUND IRRIGATION OPTIME
TOPICAL | Status: DC | PRN
  Administered 2023-09-14: 34 [oz_av]

## 2023-09-14 SURGICAL SUPPLY — 56 items
BAG DECANTER FOR FLEXI CONT (MISCELLANEOUS) ×1 IMPLANT
BLADE SURG 15 STRL LF DISP TIS (BLADE) ×1 IMPLANT
BLADE SURG SZ11 CARB STEEL (BLADE) ×1 IMPLANT
BRUSH SCRUB EZ 4% CHG (MISCELLANEOUS) ×1 IMPLANT
CHLORAPREP W/TINT 26 (MISCELLANEOUS) ×1 IMPLANT
CLAMP SUTURE YELLOW 5 PAIRS (MISCELLANEOUS) ×2 IMPLANT
CLEANSER WND VASHE 34 (WOUND CARE) IMPLANT
CLIP APPLIE 11 MED OPEN (CLIP) IMPLANT
CLIP APPLIE 9.375 SM OPEN (CLIP) IMPLANT
DERMABOND ADVANCED .7 DNX12 (GAUZE/BANDAGES/DRESSINGS) ×1 IMPLANT
DRAPE INCISE IOBAN 66X45 STRL (DRAPES) ×1 IMPLANT
DRAPE LAPAROTOMY 100X77 ABD (DRAPES) ×1 IMPLANT
DRAPE SHEET LG 3/4 BI-LAMINATE (DRAPES) IMPLANT
DRESSING SURGICEL FIBRLLR 1X2 (HEMOSTASIS) ×1 IMPLANT
ELECTRODE REM PT RTRN 9FT ADLT (ELECTROSURGICAL) ×1 IMPLANT
GAUZE 4X4 16PLY ~~LOC~~+RFID DBL (SPONGE) IMPLANT
GLOVE BIO SURGEON STRL SZ7 (GLOVE) ×1 IMPLANT
GLOVE SURG SYN 8.0 (GLOVE) ×3 IMPLANT
GLOVE SURG SYN 8.0 PF PI (GLOVE) ×1 IMPLANT
GOWN SRG XL LONG LVL 3 NONREIN (GOWNS) IMPLANT
GOWN STRL REUS W/ TWL LRG LVL3 (GOWN DISPOSABLE) ×2 IMPLANT
GOWN STRL REUS W/ TWL XL LVL3 (GOWN DISPOSABLE) ×2 IMPLANT
HEMOSTAT HEMOBLAST BELLOWS (HEMOSTASIS) IMPLANT
HOLDER FOLEY CATH W/STRAP (MISCELLANEOUS) ×1 IMPLANT
IV NS 500ML BAXH (IV SOLUTION) ×1 IMPLANT
KIT TURNOVER KIT A (KITS) ×1 IMPLANT
LABEL OR SOLS (LABEL) ×1 IMPLANT
LOOP VESSEL MAXI 1X406 RED (MISCELLANEOUS) ×2 IMPLANT
LOOP VESSEL MINI 0.8X406 BLUE (MISCELLANEOUS) ×1 IMPLANT
MANIFOLD NEPTUNE II (INSTRUMENTS) ×1 IMPLANT
NDL FILTER BLUNT 18X1 1/2 (NEEDLE) ×1 IMPLANT
NDL HYPO 22X1.5 SAFETY MO (MISCELLANEOUS) IMPLANT
NEEDLE FILTER BLUNT 18X1 1/2 (NEEDLE) ×1 IMPLANT
NEEDLE HYPO 22X1.5 SAFETY MO (MISCELLANEOUS) IMPLANT
NS IRRIG 500ML POUR BTL (IV SOLUTION) ×1 IMPLANT
PACK BASIN MAJOR ARMC (MISCELLANEOUS) ×1 IMPLANT
PATCH VASC XENOSURE .8CM X 8CM (Vascular Products) IMPLANT
SET WALTER ACTIVATION W/DRAPE (SET/KITS/TRAYS/PACK) IMPLANT
SHUNT CAROTID STR REINF 3.0X4. (MISCELLANEOUS) ×1 IMPLANT
SPONGE T-LAP 18X18 ~~LOC~~+RFID (SPONGE) IMPLANT
SUT MNCRL+ 5-0 UNDYED PC-3 (SUTURE) ×1 IMPLANT
SUT PROLENE 5 0 RB 1 DA (SUTURE) ×2 IMPLANT
SUT PROLENE 6 0 BV (SUTURE) ×8 IMPLANT
SUT PROLENE 7 0 BV 1 (SUTURE) ×6 IMPLANT
SUT SILK 2-0 18XBRD TIE 12 (SUTURE) ×1 IMPLANT
SUT SILK 3-0 18XBRD TIE 12 (SUTURE) ×1 IMPLANT
SUT SILK 4-0 18XBRD TIE 12 (SUTURE) IMPLANT
SUT VIC AB 3-0 SH 27X BRD (SUTURE) ×1 IMPLANT
SYR 10ML LL (SYRINGE) IMPLANT
SYR 20ML LL LF (SYRINGE) ×1 IMPLANT
SYR 3ML LL SCALE MARK (SYRINGE) ×1 IMPLANT
TRAP FLUID SMOKE EVACUATOR (MISCELLANEOUS) ×1 IMPLANT
TRAY FOLEY MTR SLVR 16FR STAT (SET/KITS/TRAYS/PACK) ×1 IMPLANT
TUBING CONNECTING 10 (TUBING) IMPLANT
WATER STERILE IRR 1000ML POUR (IV SOLUTION) IMPLANT
WATER STERILE IRR 500ML POUR (IV SOLUTION) ×1 IMPLANT

## 2023-09-14 NOTE — Transfer of Care (Signed)
 Immediate Anesthesia Transfer of Care Note  Patient: Julia Snyder  Procedure(s) Performed: ENDARTERECTOMY, CAROTID (Left: Neck)  Patient Location: PACU  Anesthesia Type:General  Level of Consciousness: drowsy and patient cooperative  Airway & Oxygen Therapy: Patient Spontanous Breathing and Patient connected to face mask oxygen  Post-op Assessment: Report given to RN and Post -op Vital signs reviewed and stable  Post vital signs: stable, pt moving all extremities and following commands  Last Vitals:  Vitals Value Taken Time  BP    Temp    Pulse    Resp    SpO2      Last Pain:  Vitals:   09/14/23 0718  PainSc: 0-No pain         Complications: No notable events documented.

## 2023-09-14 NOTE — Op Note (Signed)
 [facility]   OPERATIVE NOTE  PROCEDURE:   1.  Left carotid endarterectomy with bovine pericardial arterial patch reconstruction  PRE-OPERATIVE DIAGNOSIS: 1.  Critical left carotid stenosis   POST-OPERATIVE DIAGNOSIS: same as above   SURGEON: Cordella KANDICE Shawl, MD  ASSISTANT(S): None  ANESTHESIA: general  ESTIMATED BLOOD LOSS: 50 cc  FINDING(S): 1.  Extensive calcified carotid plaque.  SPECIMEN(S):  Carotid plaque (sent to Pathology)  INDICATIONS:   MATIA ZELADA is a 78 y.o. y.o. female who presents with critical left carotid stenosis of greater than 90%.  The risks, benefits, and alternatives to carotid endarterectomy were discussed with the patient. The differences between carotid stenting and carotid endarterectomy were reviewed.  The patient voiced understanding and appears to be aware that the risks of carotid endarterectomy include but are not limited to: bleeding, infection, stroke, myocardial infarction, death, cranial nerve injuries both temporary and permanent, neck hematoma, possible airway compromise, labile blood pressure post-operatively, cerebral hyperperfusion syndrome, and possible need for additional interventions in the future. The patient is aware of the risks and agrees to proceed forward with the procedure.  DESCRIPTION: After full informed written consent was obtained from the patient, the patient was brought back to the operating room and placed supine upon the operating table.  Prior to induction, the patient received IV antibiotics.  After obtaining adequate anesthesia, the patient was placed a supine position with a shoulder roll in place and the patient's neck slightly hyperextended and rotated away from the surgical site.  The patient was prepped in the standard fashion for a carotid endarterectomy.    A first assistant was required to provide a safe and appropriate environment for executing the surgery.  The assistant was  integral in providing retraction, exposure, running suture providing suction and in the closing process.  The incision was made anterior to the sternocleidomastoid muscle and dissected down through the subcutaneous tissue.  The platysmas was opened with electrocautery.  The internal jugular vein and facial vein were identified.  The facial vein is ligated and divided between 2-0 silk ties.  The omohyoid was identified in the common carotid artery exposed at this level. The dissection was there in carried out along the carotid artery in a cranial direction.  The dissection was then carried along periadventitial plane along the common carotid artery up to the bifurcation. The external carotid artery was identified. Vessel loops were then placed around the external carotid artery as well as the superior thyroid  artery. In the process of this dissection, the hypoglossal nerve was identified and protected from harm.  The internal carotid artery was then dissected circumferentially just beyond an area in the internal carotid artery distal to the plaque.    At this point, we gave the patient 7000 units of intravenous heparin.  After this was allowed to circulate for several minutes, the common carotid followed by the external carotid and then the internal carotid artery were clamped.  Arteriotomy was made in the common carotid artery with a 11 blade, and extended the arteriotomy with a Potts scissor down into the common carotid artery, then the arteriotomy was carried through the bifurcation into the internal carotid artery until I reached an area that was not diseased.  At this point, a Sundt shunt was placed.  The endarterectomy was begun in the common carotid artery with a Runner, broadcasting/film/video and carried this dissection down into the common carotid artery circumferentially.  Then I transected the plaque at a segment where  it was adherent and transected the plaque with Potts scissors.  I then carried this  dissection up into the external carotid artery.  The plaque was extracted by unclamping the external carotid artery and performing an eversion endarterectomy.  The dissection was then carried into the internal carotid artery where a  feathered end point was created.  The plaque was passed off the field as a specimen.  The distal endpoint was tacked down with 6 interrupted 7-0 Prolene sutures.  A bovine pericardial arterial patch was delivered onto the field and trimmed appropriately for the artery and sewed in place with 6-0 Prolene using a 4 quadrant technique.  The medial suture line was completed and the lateral suture line was run approximately one quarter the length of the arteriotomy.  Prior to completing this patch angioplasty, the shunt was removed, the internal carotid artery was flushed and there was excellent backbleeding.  The carotid artery repair was flushed with heparinized saline and then the patch angioplasty was completed in the usual fashion.  The flow was then reestablished first to the external carotid artery and then the internal carotid artery to prevent distal embolization.   Several minutes of pressure were held and 6-0 Prolene patch sutures were used as need for hemostasis.  At this point, I placed fibrillar and hemoblast topical hemostatic agents.  There was no more active bleeding in the surgical site.  The sternocleidomastoid space was closed with three interrupted 3-0 Vicryl sutures. I then reapproximated the platysma muscle with a running stitch of 3-0 Vicryl.  The skin was then closed with a running subcuticular 4-0 Monocryl.  The skin was then cleaned, dried and Dermabond was used to reinforce the skin closure.  The patient awakened and was taken to the recovery room in stable condition, following commands and moving all four extremities without any apparent deficits.    COMPLICATIONS: none  CONDITION: stable  Cordella Shawl 09/14/2023<11:27 AM

## 2023-09-14 NOTE — Anesthesia Preprocedure Evaluation (Addendum)
 Anesthesia Evaluation  Patient identified by MRN, date of birth, ID band Patient awake    Reviewed: Allergy & Precautions, NPO status , Patient's Chart, lab work & pertinent test results  History of Anesthesia Complications Negative for: history of anesthetic complications  Airway Mallampati: I  TM Distance: >3 FB Neck ROM: full    Dental no notable dental hx.    Pulmonary former smoker   Pulmonary exam normal        Cardiovascular + Peripheral Vascular Disease  Normal cardiovascular exam     Neuro/Psych negative neurological ROS  negative psych ROS   GI/Hepatic negative GI ROS, Neg liver ROS,,,  Endo/Other  negative endocrine ROS    Renal/GU      Musculoskeletal   Abdominal   Peds  Hematology negative hematology ROS (+)   Anesthesia Other Findings Past Medical History: No date: Arthritis No date: Bilateral carotid artery stenosis 11/10/2011: Closed fracture of lower end of humerus No date: Depression 02/25/2017: Dyslipidemia 06/20/2023: Lumbar spondylosis 12/28/2021: MCI (mild cognitive impairment) with memory loss 11/22/2022: Orthostatic hypotension No date: Overactive bladder No date: Urge incontinence of urine 04/11/2018: Vertigo  Past Surgical History: 2004: BREAST BIOPSY; Left     Comment:  benign No date: BUNIONECTOMY; Bilateral     Comment:  left ~2014, right 2018 No date: COLONOSCOPY No date: ELBOW FRACTURE SURGERY; Bilateral     Comment:  right 2019, left ~2015 No date: FRACTURE SURGERY 05/14/2022: KNEE ARTHROPLASTY; Left     Comment:  Procedure: COMPUTER ASSISTED TOTAL KNEE ARTHROPLASTY -               RNFA;  Surgeon: Mardee Lynwood SQUIBB, MD;  Location: [facility] ORS;              Service: Orthopedics;  Laterality: Left; 12/01/2017: ORIF ELBOW FRACTURE; Right     Comment:  Procedure: OPEN REDUCTION INTERNAL FIXATION (ORIF)               ELBOW/OLECRANON FRACTURE;  Surgeon: Kathlynn Sharper, MD;                 Location: [facility] ORS;  Service: Orthopedics;  Laterality:               Right; 2015: OTHER SURGICAL HISTORY     Comment:  left arm surgery with titanium plate     Reproductive/Obstetrics negative OB ROS                              Anesthesia Physical Anesthesia Plan  ASA: 3  Anesthesia Plan: General ETT   Post-op Pain Management: Ofirmev  IV (intra-op)* and Toradol IV (intra-op)*   Induction: Intravenous  PONV Risk Score and Plan: Ondansetron , Dexamethasone  and Treatment may vary due to age or medical condition  Airway Management Planned: Oral ETT  Additional Equipment:   Intra-op Plan:   Post-operative Plan: Extubation in OR  Informed Consent: I have reviewed the patients History and Physical, chart, labs and discussed the procedure including the risks, benefits and alternatives for the proposed anesthesia with the patient or authorized representative who has indicated his/her understanding and acceptance.     Dental Advisory Given  Plan Discussed with: Anesthesiologist, CRNA and Surgeon  Anesthesia Plan Comments: (Patient consented for risks of anesthesia including but not limited to:  - adverse reactions to medications - damage to eyes, teeth, lips or other oral mucosa - nerve damage due to positioning  - sore throat or  hoarseness - Damage to heart, brain, nerves, lungs, other parts of body or loss of life  Patient voiced understanding and assent.)         Anesthesia Quick Evaluation

## 2023-09-14 NOTE — Anesthesia Procedure Notes (Signed)
 Procedure Name: Intubation Date/Time: 09/14/2023 8:34 AM  Performed by: Norleen Alberta HERO., CRNAPre-anesthesia Checklist: Patient identified, Patient being monitored, Timeout performed, Emergency Drugs available and Suction available Patient Re-evaluated:Patient Re-evaluated prior to induction Oxygen Delivery Method: Circle system utilized Preoxygenation: Pre-oxygenation with 100% oxygen Induction Type: IV induction Ventilation: Mask ventilation without difficulty and Oral airway inserted - appropriate to patient size Laryngoscope Size: 3 and McGrath Grade View: Grade I Tube type: Oral Tube size: 7.0 mm Number of attempts: 1 Airway Equipment and Method: Stylet Placement Confirmation: ETT inserted through vocal cords under direct vision, positive ETCO2 and breath sounds checked- equal and bilateral Secured at: 21 cm Tube secured with: Tape Dental Injury: Teeth and Oropharynx as per pre-operative assessment

## 2023-09-14 NOTE — Progress Notes (Signed)
 MRN : [id number]  Julia Snyder is a 78 y.o. ([birth year]) female who presents with chief complaint of check carotid arteries.  History of Present Illness:   Patient presents today to [facility] for treatment of her critical carotid stenosis.  CT scan was done 07/05/2023.  Patient reports that the test went well with no problems or complications.    The patient denies interval amaurosis fugax. There is no recent or interval TIA symptoms or focal motor deficits. There is no prior documented CVA.   The patient is taking enteric-coated aspirin 81 mg daily.   There is no history of migraine headaches. There is no history of seizures.   No recent shortening of the patient's walking distance or new symptoms consistent with claudication.  No history of rest pain symptoms. No new ulcers or wounds of the lower extremities have occurred.   There is no history of DVT, PE or superficial thrombophlebitis. No recent episodes of angina or shortness of breath documented.    CT angiogram is reviewed by me personally and shows approximately 60% stenosis of the right internal carotid artery and greater than 95% stenosis of the left internal carotid artery.  The left carotid stenosis is consistent with calcified plaque at the origin of the left internal carotid artery.   Current Meds  Medication Sig   Ascorbic Acid (VITAMIN C) 1000 MG tablet Take 1,000 mg by mouth daily.   atorvastatin  (LIPITOR) 40 MG tablet Take 1 tablet (40 mg total) by mouth daily. Take 1/2 tablet for 1 week then increase to full tablet   calcitonin, salmon, (MIACALCIN/FORTICAL) 200 UNIT/ACT nasal spray TAKE 1 SPRAY IN NOSTRIL DAILY (Patient not taking: Reported on 09/07/2023)   Calcium  Carbonate-Vit D-Min (CALCIUM  1200 PO) Take 1,200 mg by mouth daily.   Cholecalciferol 125 MCG (5000 UT) TABS Take 5,000 Units by mouth daily.   Cyanocobalamin  (B-12 PO) Take 2,000 mcg by mouth daily.    Multiple Vitamins-Minerals (MULTIPLE VITAMINS/WOMENS PO) Take 1 tablet by mouth daily.   Omega-3 Fatty Acids (FISH OIL PO) Take 2,500 mg by mouth daily.   vitamin k 100 MCG tablet Take 100 mcg by mouth daily.    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   Orthostatic hypotension 11/22/2022   Overactive bladder    Urge incontinence of urine    Vertigo 04/11/2018    Past Surgical History:  Procedure Laterality Date   BREAST BIOPSY Left 2004   benign   BUNIONECTOMY Bilateral    left ~2014, right 2018   COLONOSCOPY     ELBOW FRACTURE SURGERY Bilateral    right 2019, left ~2015   FRACTURE SURGERY     KNEE ARTHROPLASTY Left 05/14/2022   Procedure: COMPUTER ASSISTED TOTAL KNEE ARTHROPLASTY - RNFA;  Surgeon: Mardee Lynwood SQUIBB, MD;  Location: [facility] ORS;  Service: Orthopedics;  Laterality: Left;   ORIF ELBOW FRACTURE Right 12/01/2017   Procedure: OPEN REDUCTION INTERNAL FIXATION (ORIF) ELBOW/OLECRANON FRACTURE;  Surgeon: Kathlynn Sharper, MD;  Location: [facility] ORS;  Service: Orthopedics;  Laterality: Right;   OTHER SURGICAL HISTORY  2015   left arm  surgery with titanium plate    Social History Social History   Tobacco Use   Smoking status: Former    Types: Cigarettes    Passive exposure: Past   Smokeless tobacco: Never   Tobacco comments:    Quit date was unknown   Advertising account planner   Vaping status: Never Used  Substance Use Topics   Alcohol use: Yes    Comment: occassional   Drug use: Never    Family History Family History  Problem Relation Age of Onset   Stroke Mother    Heart disease Mother    Parkinson's disease Father    Diabetes Neg Hx    Cancer Neg Hx     Allergies  Allergen Reactions   Donepezil Other (See Comments)    Sedation.       REVIEW OF SYSTEMS (Negative unless checked)  Constitutional: [] Weight loss  [] Fever  [] Chills Cardiac: [] Chest pain   [] Chest pressure   [] Palpitations   [] Shortness of breath when laying flat   [] Shortness of breath with exertion. Vascular:  [x] Pain in legs with walking   [] Pain in legs at rest  [] History of DVT   [] Phlebitis   [] Swelling in legs   [] Varicose veins   [] Non-healing ulcers Pulmonary:   [] Uses home oxygen   [] Productive cough   [] Hemoptysis   [] Wheeze  [] COPD   [] Asthma Neurologic:  [] Dizziness   [] Seizures   [] History of stroke   [] History of TIA  [] Aphasia   [] Vissual changes   [] Weakness or numbness in arm   [] Weakness or numbness in leg Musculoskeletal:   [] Joint swelling   [] Joint pain   [] Low back pain Hematologic:  [] Easy bruising  [] Easy bleeding   [] Hypercoagulable state   [] Anemic Gastrointestinal:  [] Diarrhea   [] Vomiting  [] Gastroesophageal reflux/heartburn   [] Difficulty swallowing. Genitourinary:  [] Chronic kidney disease   [] Difficult urination  [] Frequent urination   [] Blood in urine Skin:  [] Rashes   [] Ulcers  Psychological:  [] History of anxiety   []  History of major depression.  Physical Examination  Vitals:   09/14/23 0718  BP: 132/66  Pulse: 74  Resp: 18  Temp: 98.3 F (36.8 C)  SpO2: 99%   There is no height or weight on file to calculate BMI. Gen: WD/WN, NAD Head: [place]/AT, No temporalis wasting.  Ear/Nose/Throat: Hearing grossly intact, nares w/o erythema or drainage Eyes: PER, EOMI, sclera nonicteric.  Neck: Supple, no masses.  No bruit or JVD.  Pulmonary:  Good air movement, no audible wheezing, no use of accessory muscles.  Cardiac: RRR, normal S1, S2, no Murmurs. Vascular:  carotid bruit noted Vessel Right Left  Radial Palpable Palpable  Carotid  Palpable  Palpable  Subclav  Palpable Palpable  Gastrointestinal: soft, non-distended. No guarding/no peritoneal signs.  Musculoskeletal: M/S 5/5 throughout.  No visible deformity.   Neurologic: CN 2-12 intact. Pain and light touch intact in extremities.  Symmetrical.  Speech is fluent. Motor exam as listed above. Psychiatric: Judgment intact, Mood & affect appropriate for pt's clinical situation. Dermatologic: No rashes or ulcers noted.  No changes consistent with cellulitis.   CBC Lab Results  Component Value Date   WBC 5.1 08/31/2023   HGB 13.8 08/31/2023   HCT 42.9 08/31/2023   MCV 89.0 08/31/2023   PLT 308 08/31/2023    BMET    Component Value Date/Time   NA 139 08/31/2023 0852   K 3.8 08/31/2023 0852   CL 101 08/31/2023 0852   CO2 28 08/31/2023 0852  GLUCOSE 98 08/31/2023 0852   BUN 9 08/31/2023 0852   CREATININE 0.61 08/31/2023 0852   CALCIUM  9.5 08/31/2023 0852   GFRNONAA >60 08/31/2023 0852   Estimated Creatinine Clearance: 47.9 mL/min (by C-G formula based on SCr of 0.61 mg/dL).  COAG No results found for: INR, PROTIME  Radiology No results found.   Assessment/Plan 1. Bilateral carotid artery stenosis (Primary) Recommend:   The patient remains asymptomatic with respect to the carotid stenosis.  However, the patient has now progressed and has a lesion the is >75%.   Patient's CT angiography of the carotid arteries confirms >75% left ICA stenosis.  The anatomical considerations support surgery over stenting.  This was discussed in detail with the patient.   The patient does indeed need surgery, therefore, cardiac clearance will be arranged. Once cleared the patient will be scheduled for surgery.   I have completed Shared Decision-Making with Rock CROME Orlandoprior to carotid surgery/intervention. The conversation included: -Discussion of all treatment options including carotid endarterectomy (CEA), carotid artery stenting (which includes transcarotid artery revascularization (TCAR)), and optimal medical therapy (OMT). -Explanation of risks and benefits for each option specific to [employer] clinical situation. -Integration of  clinical guidelines as it relates to the patient's history and comorbidities. -Discussion and incorporation of Rock CROME Hire and their personal preferences and priorities in choosing a treatment plan plan   If the patient was unable to participate in Shared Decision Making this process was done with the patient.   The patient's NIHSS score is as follows: 0 Mild: 1 - 5 Mild to Moderately Severe: 5 - 14 Severe: 15 - 24 Very Severe: >25   The risks, benefits and alternative therapies were reviewed in detail with the patient.  All questions were answered.  The patient agrees to proceed with surgery of the left carotid artery.   Continue antiplatelet therapy as prescribed. Continue management of CAD, HTN and Hyperlipidemia. Healthy heart diet, encouraged exercise at least 4 times per week.  - Ambulatory referral to Cardiology   2. Hyperlipidemia, unspecified hyperlipidemia type Continue statin as ordered and reviewed, no changes at this time   3. Primary osteoarthritis of left knee Continue medications to treat the patient's degenerative disease as already ordered, these medications have been reviewed and there are no changes at this time.   Continued activity and therapy was stressed.     Cordella Shawl, MD  09/14/2023 7:45 AM

## 2023-09-14 NOTE — Interval H&P Note (Signed)
 History and Physical Interval Note:  09/14/2023 7:47 AM  Julia Snyder  has presented today for surgery, with the diagnosis of CAROTID STENOSIS.  The various methods of treatment have been discussed with the patient and family. After consideration of risks, benefits and other options for treatment, the patient has consented to  Procedure(s): ENDARTERECTOMY, CAROTID (Left) as a surgical intervention.  The patient's history has been reviewed, patient examined, no change in status, stable for surgery.  I have reviewed the patient's chart and labs.  Questions were answered to the patient's satisfaction.     Cordella Shawl

## 2023-09-14 NOTE — H&P (View-Only) (Signed)
 MRN : [id number]  Julia Snyder is a 78 y.o. ([birth year]) female who presents with chief complaint of check carotid arteries.  History of Present Illness:   Patient presents today to [facility] for treatment of her critical carotid stenosis.  CT scan was done 07/05/2023.  Patient reports that the test went well with no problems or complications.    The patient denies interval amaurosis fugax. There is no recent or interval TIA symptoms or focal motor deficits. There is no prior documented CVA.   The patient is taking enteric-coated aspirin 81 mg daily.   There is no history of migraine headaches. There is no history of seizures.   No recent shortening of the patient's walking distance or new symptoms consistent with claudication.  No history of rest pain symptoms. No new ulcers or wounds of the lower extremities have occurred.   There is no history of DVT, PE or superficial thrombophlebitis. No recent episodes of angina or shortness of breath documented.    CT angiogram is reviewed by me personally and shows approximately 60% stenosis of the right internal carotid artery and greater than 95% stenosis of the left internal carotid artery.  The left carotid stenosis is consistent with calcified plaque at the origin of the left internal carotid artery.   Current Meds  Medication Sig   Ascorbic Acid (VITAMIN C) 1000 MG tablet Take 1,000 mg by mouth daily.   atorvastatin  (LIPITOR) 40 MG tablet Take 1 tablet (40 mg total) by mouth daily. Take 1/2 tablet for 1 week then increase to full tablet   calcitonin, salmon, (MIACALCIN/FORTICAL) 200 UNIT/ACT nasal spray TAKE 1 SPRAY IN NOSTRIL DAILY (Patient not taking: Reported on 09/07/2023)   Calcium  Carbonate-Vit D-Min (CALCIUM  1200 PO) Take 1,200 mg by mouth daily.   Cholecalciferol 125 MCG (5000 UT) TABS Take 5,000 Units by mouth daily.   Cyanocobalamin  (B-12 PO) Take 2,000 mcg by mouth daily.    Multiple Vitamins-Minerals (MULTIPLE VITAMINS/WOMENS PO) Take 1 tablet by mouth daily.   Omega-3 Fatty Acids (FISH OIL PO) Take 2,500 mg by mouth daily.   vitamin k 100 MCG tablet Take 100 mcg by mouth daily.    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   Orthostatic hypotension 11/22/2022   Overactive bladder    Urge incontinence of urine    Vertigo 04/11/2018    Past Surgical History:  Procedure Laterality Date   BREAST BIOPSY Left 2004   benign   BUNIONECTOMY Bilateral    left ~2014, right 2018   COLONOSCOPY     ELBOW FRACTURE SURGERY Bilateral    right 2019, left ~2015   FRACTURE SURGERY     KNEE ARTHROPLASTY Left 05/14/2022   Procedure: COMPUTER ASSISTED TOTAL KNEE ARTHROPLASTY - RNFA;  Surgeon: Mardee Lynwood SQUIBB, MD;  Location: [facility] ORS;  Service: Orthopedics;  Laterality: Left;   ORIF ELBOW FRACTURE Right 12/01/2017   Procedure: OPEN REDUCTION INTERNAL FIXATION (ORIF) ELBOW/OLECRANON FRACTURE;  Surgeon: Kathlynn Sharper, MD;  Location: [facility] ORS;  Service: Orthopedics;  Laterality: Right;   OTHER SURGICAL HISTORY  2015   left arm  surgery with titanium plate    Social History Social History   Tobacco Use   Smoking status: Former    Types: Cigarettes    Passive exposure: Past   Smokeless tobacco: Never   Tobacco comments:    Quit date was unknown   Advertising account planner   Vaping status: Never Used  Substance Use Topics   Alcohol use: Yes    Comment: occassional   Drug use: Never    Family History Family History  Problem Relation Age of Onset   Stroke Mother    Heart disease Mother    Parkinson's disease Father    Diabetes Neg Hx    Cancer Neg Hx     Allergies  Allergen Reactions   Donepezil Other (See Comments)    Sedation.       REVIEW OF SYSTEMS (Negative unless checked)  Constitutional: [] Weight loss  [] Fever  [] Chills Cardiac: [] Chest pain   [] Chest pressure   [] Palpitations   [] Shortness of breath when laying flat   [] Shortness of breath with exertion. Vascular:  [x] Pain in legs with walking   [] Pain in legs at rest  [] History of DVT   [] Phlebitis   [] Swelling in legs   [] Varicose veins   [] Non-healing ulcers Pulmonary:   [] Uses home oxygen   [] Productive cough   [] Hemoptysis   [] Wheeze  [] COPD   [] Asthma Neurologic:  [] Dizziness   [] Seizures   [] History of stroke   [] History of TIA  [] Aphasia   [] Vissual changes   [] Weakness or numbness in arm   [] Weakness or numbness in leg Musculoskeletal:   [] Joint swelling   [] Joint pain   [] Low back pain Hematologic:  [] Easy bruising  [] Easy bleeding   [] Hypercoagulable state   [] Anemic Gastrointestinal:  [] Diarrhea   [] Vomiting  [] Gastroesophageal reflux/heartburn   [] Difficulty swallowing. Genitourinary:  [] Chronic kidney disease   [] Difficult urination  [] Frequent urination   [] Blood in urine Skin:  [] Rashes   [] Ulcers  Psychological:  [] History of anxiety   []  History of major depression.  Physical Examination  Vitals:   09/14/23 0718  BP: 132/66  Pulse: 74  Resp: 18  Temp: 98.3 F (36.8 C)  SpO2: 99%   There is no height or weight on file to calculate BMI. Gen: WD/WN, NAD Head: [place]/AT, No temporalis wasting.  Ear/Nose/Throat: Hearing grossly intact, nares w/o erythema or drainage Eyes: PER, EOMI, sclera nonicteric.  Neck: Supple, no masses.  No bruit or JVD.  Pulmonary:  Good air movement, no audible wheezing, no use of accessory muscles.  Cardiac: RRR, normal S1, S2, no Murmurs. Vascular:  carotid bruit noted Vessel Right Left  Radial Palpable Palpable  Carotid  Palpable  Palpable  Subclav  Palpable Palpable  Gastrointestinal: soft, non-distended. No guarding/no peritoneal signs.  Musculoskeletal: M/S 5/5 throughout.  No visible deformity.   Neurologic: CN 2-12 intact. Pain and light touch intact in extremities.  Symmetrical.  Speech is fluent. Motor exam as listed above. Psychiatric: Judgment intact, Mood & affect appropriate for pt's clinical situation. Dermatologic: No rashes or ulcers noted.  No changes consistent with cellulitis.   CBC Lab Results  Component Value Date   WBC 5.1 08/31/2023   HGB 13.8 08/31/2023   HCT 42.9 08/31/2023   MCV 89.0 08/31/2023   PLT 308 08/31/2023    BMET    Component Value Date/Time   NA 139 08/31/2023 0852   K 3.8 08/31/2023 0852   CL 101 08/31/2023 0852   CO2 28 08/31/2023 0852  GLUCOSE 98 08/31/2023 0852   BUN 9 08/31/2023 0852   CREATININE 0.61 08/31/2023 0852   CALCIUM  9.5 08/31/2023 0852   GFRNONAA >60 08/31/2023 0852   Estimated Creatinine Clearance: 47.9 mL/min (by C-G formula based on SCr of 0.61 mg/dL).  COAG No results found for: INR, PROTIME  Radiology No results found.   Assessment/Plan 1. Bilateral carotid artery stenosis (Primary) Recommend:   The patient remains asymptomatic with respect to the carotid stenosis.  However, the patient has now progressed and has a lesion the is >75%.   Patient's CT angiography of the carotid arteries confirms >75% left ICA stenosis.  The anatomical considerations support surgery over stenting.  This was discussed in detail with the patient.   The patient does indeed need surgery, therefore, cardiac clearance will be arranged. Once cleared the patient will be scheduled for surgery.   I have completed Shared Decision-Making with Rock CROME Orlandoprior to carotid surgery/intervention. The conversation included: -Discussion of all treatment options including carotid endarterectomy (CEA), carotid artery stenting (which includes transcarotid artery revascularization (TCAR)), and optimal medical therapy (OMT). -Explanation of risks and benefits for each option specific to [employer] clinical situation. -Integration of  clinical guidelines as it relates to the patient's history and comorbidities. -Discussion and incorporation of Rock CROME Hire and their personal preferences and priorities in choosing a treatment plan plan   If the patient was unable to participate in Shared Decision Making this process was done with the patient.   The patient's NIHSS score is as follows: 0 Mild: 1 - 5 Mild to Moderately Severe: 5 - 14 Severe: 15 - 24 Very Severe: >25   The risks, benefits and alternative therapies were reviewed in detail with the patient.  All questions were answered.  The patient agrees to proceed with surgery of the left carotid artery.   Continue antiplatelet therapy as prescribed. Continue management of CAD, HTN and Hyperlipidemia. Healthy heart diet, encouraged exercise at least 4 times per week.  - Ambulatory referral to Cardiology   2. Hyperlipidemia, unspecified hyperlipidemia type Continue statin as ordered and reviewed, no changes at this time   3. Primary osteoarthritis of left knee Continue medications to treat the patient's degenerative disease as already ordered, these medications have been reviewed and there are no changes at this time.   Continued activity and therapy was stressed.     Cordella Shawl, MD  09/14/2023 7:45 AM

## 2023-09-15 ENCOUNTER — Encounter: Payer: Self-pay | Admitting: Vascular Surgery

## 2023-09-15 DIAGNOSIS — I6522 Occlusion and stenosis of left carotid artery: Secondary | ICD-10-CM

## 2023-09-15 DIAGNOSIS — Z9889 Other specified postprocedural states: Secondary | ICD-10-CM

## 2023-09-15 LAB — CBC
HCT: 36.4 % (ref 36.0–46.0)
Hemoglobin: 11.8 g/dL — ABNORMAL LOW (ref 12.0–15.0)
MCH: 29.4 pg (ref 26.0–34.0)
MCHC: 32.4 g/dL (ref 30.0–36.0)
MCV: 90.8 fL (ref 80.0–100.0)
Platelets: 237 10*3/uL (ref 150–400)
RBC: 4.01 MIL/uL (ref 3.87–5.11)
RDW: 13.4 % (ref 11.5–15.5)
WBC: 7.8 10*3/uL (ref 4.0–10.5)
nRBC: 0 % (ref 0.0–0.2)

## 2023-09-15 LAB — BASIC METABOLIC PANEL WITH GFR
Anion gap: 8 (ref 5–15)
BUN: 9 mg/dL (ref 8–23)
CO2: 26 mmol/L (ref 22–32)
Calcium: 8.5 mg/dL — ABNORMAL LOW (ref 8.9–10.3)
Chloride: 105 mmol/L (ref 98–111)
Creatinine, Ser: 0.57 mg/dL (ref 0.44–1.00)
GFR, Estimated: >60 mL/min (ref >60)
Glucose, Bld: 104 mg/dL — ABNORMAL HIGH (ref 70–99)
Potassium: 3.6 mmol/L (ref 3.5–5.1)
Sodium: 139 mmol/L (ref 135–145)

## 2023-09-15 LAB — SURGICAL PATHOLOGY

## 2023-09-15 MED ORDER — OXYCODONE HCL 5 MG PO TABS: 5.0000 mg | ORAL_TABLET | ORAL | 0 refills | Status: DC | PRN

## 2023-09-15 MED ORDER — ASPIRIN 81 MG PO TBEC: 81.0000 mg | DELAYED_RELEASE_TABLET | Freq: Every day | ORAL | 12 refills | Status: AC

## 2023-09-15 NOTE — Discharge Instructions (Addendum)
 Vascular Surgery Discharge Instruction:  Do not lift anything heavy.  Do not lift anything more than a gallon of milk for the next 2 weeks.  Do not drive while taking pain medication.  Do not drive for the next 2 weeks as your neck will be sore making it difficult to drive and be safe.  You may shower tomorrow on 09/16/2023 after getting home.  Do not shower with the water directly hitting your incision to your left neck.  Shower with warm water hitting your back.  Let soap and water run over the incision but do not scrub.  Your incision has been closed with Dermabond glue and sutured internally.  If the glue starts to peel do not pick at it or pull it off leave it alone.  You are being discharged on aspirin 81 mg daily, and Crestor 5 mg daily.  Please do not miss or skip taking his medications as it may alter the outcome of your surgery.  Follow-up with vein and vascular surgery as scheduled.

## 2023-09-15 NOTE — Progress Notes (Signed)
 Patient becoming increasingly frustrated/agitated with all the cords, IV lines, and monitor noises stating that she was not aware that all of this would be happening and that it was not explained that she would be in the ICU post endarterectomy. Patient does not call out when she needs to use the bathroom and had once been found out of bed without assistance pulling at lines, warranting the bed/chair alarm to keep the patient safe. Re-educated the patient on need for close monitoring and that the location for this after vascular surgery is the ICU. Patient is forgetful and requires redirection and re-orientation frequently. All vitals stable and NIHSS remains 0.  Update: Patient's husband, Sheree, called to come in and see the patient.  Arlean FORBES Bowers, RN

## 2023-09-15 NOTE — Anesthesia Postprocedure Evaluation (Signed)
 Anesthesia Post Note  Patient: Julia Snyder  Procedure(s) Performed: ENDARTERECTOMY, CAROTID (Left: Neck)  Patient location during evaluation: ICU Anesthesia Type: General Level of consciousness: awake Pain management: pain level controlled Respiratory status: spontaneous breathing Cardiovascular status: stable Anesthetic complications: no   There were no known notable events for this encounter.   Last Vitals:  Vitals:   09/15/23 0700 09/15/23 0710  BP:  139/70  Pulse: 68 66  Resp: 16 20  Temp:    SpO2: 100% 97%    Last Pain:  Vitals:   09/15/23 0400  TempSrc: Oral  PainSc: 0-No pain                 Shona Earnie Fare

## 2023-09-15 NOTE — Discharge Summary (Signed)
 [facility]    Discharge Summary    Patient ID:  Julia Snyder MRN: [id number] DOB/AGE: [birth year] 78 y.o.  Admit date: 09/14/2023 Discharge date: 09/15/2023 Date of Surgery: 09/14/2023 Surgeon: Surgeon(s): Schnier, Cordella MATSU, MD  Admission Diagnosis: Stenosis of left carotid artery [I65.22] Carotid stenosis, asymptomatic [I65.29]  Discharge Diagnoses:  Stenosis of left carotid artery [I65.22] Carotid stenosis, asymptomatic [I65.29]  Secondary Diagnoses: 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   Orthostatic hypotension 11/22/2022   Overactive bladder    Urge incontinence of urine    Vertigo 04/11/2018    Procedure(s): ENDARTERECTOMY, CAROTID  Discharged Condition: good  HPI:  Julia Snyder is a 78 year old female who [facility] for left carotid open endarterectomy.  CAT scan was completed on 07/05/2023 that was reviewed by Dr. Cordella Shawl MD which showed approximately 60% stenosis of the right internal carotid artery and greater than 95% stenosis of the left internal carotid artery.  Left carotid artery stenosis was consistent with calcified plaque at the origin of the left internal carotid artery.  Patient was scheduled for a left open carotid endarterectomy surgery on 09/14/2023.  Hospital Course:  Julia Snyder is a 78 y.o. female is S/P Left open carotid endarterectomy.  Patient is resting comfortably in bed this morning.  Incisions clean dry and intact and closed with Dermabond.  No erythema noted this morning.  Patient does endorse soreness to her left neck as this is expected.  Patient is recovering as expected.  Patient is ambulating, eating and urinating well.  No  complications overnight.  Vitals all remained stable.  Patient to be discharged home later today.  Patient being discharged on aspirin 81 mg daily and Lipitor 40 mg daily.  Patient was counseled not to miss or skip taking any of these medications as it may alter the outcome of her surgery.  Patient verbalizes her understanding.  I spent greater than 60 minutes preparing teaching and completing the patient's discharge today.  Extubated: POD # 0 Physical Exam:  Alert notes x3, no acute distress Face: Symmetrical.  Tongue is midline. Neck: Trachea is midline.  No swelling or bruising. Cardiovascular: Regular rate and rhythm Pulmonary: Clear to auscultation bilaterally Abdomen: Soft, nontender, nondistended Left lower extremity: Thigh soft.  Calf soft.  Extremities warm distally toes.  Hard to palpate pedal pulses however the foot is warm is her good capillary refill. Right lower extremity: Thigh soft.  Calf soft.  Extremities warm distally toes.  Hard to palpate pedal pulses however the foot is warm is her good capillary refill. Neurological: No deficits noted   Post-op wounds:  clean, dry, intact or healing well  Pt. Ambulating, voiding and taking PO diet without difficulty. Pt pain controlled with PO pain meds.  Labs:  As below  Complications: none  Consults:    Significant Diagnostic Studies: CBC Lab Results  Component Value Date   WBC 7.8 09/15/2023   HGB 11.8 (L) 09/15/2023   HCT 36.4 09/15/2023   MCV 90.8 09/15/2023   PLT 237 09/15/2023    BMET    Component Value Date/Time   NA 139 09/15/2023 0548   K 3.6 09/15/2023 0548   CL 105 09/15/2023 0548  CO2 26 09/15/2023 0548   GLUCOSE 104 (H) 09/15/2023 0548   BUN 9 09/15/2023 0548   CREATININE 0.57 09/15/2023 0548   CALCIUM  8.5 (L) 09/15/2023 0548   GFRNONAA >60 09/15/2023 0548   COAG No results found for: INR, PROTIME   Disposition:  Discharge to :Home  Allergies as of 09/15/2023       Reactions    Donepezil Other (See Comments)   Sedation.         Medication List     TAKE these medications    alendronate 70 MG tablet Commonly known as: FOSAMAX Take 70 mg by mouth.   aspirin EC 81 MG tablet Take 1 tablet (81 mg total) by mouth daily at 6 (six) AM. Swallow whole. Start taking on: September 16, 2023   atorvastatin  40 MG tablet Commonly known as: LIPITOR Take 1 tablet (40 mg total) by mouth daily. Take 1/2 tablet for 1 week then increase to full tablet   B-12 PO Take 2,000 mcg by mouth daily.   calcitonin (salmon) 200 UNIT/ACT nasal spray Commonly known as: MIACALCIN/FORTICAL TAKE 1 SPRAY IN NOSTRIL DAILY   CALCIUM  1200 PO Take 1,200 mg by mouth daily.   Cholecalciferol 125 MCG (5000 UT) Tabs Take 5,000 Units by mouth daily.   FISH OIL PO Take 2,500 mg by mouth daily.   MULTIPLE VITAMINS/WOMENS PO Take 1 tablet by mouth daily.   oxyCODONE  5 MG immediate release tablet Commonly known as: Oxy IR/ROXICODONE  Take 1-2 tablets (5-10 mg total) by mouth every 4 (four) hours as needed for moderate pain (pain score 4-6).   vitamin C 1000 MG tablet Take 1,000 mg by mouth daily.   vitamin k 100 MCG tablet Take 100 mcg by mouth daily.       Verbal and written Discharge instructions given to the patient. Wound care per Discharge AVS  Follow-up Information     Schnier, Cordella MATSU, MD Follow up in 3 week(s).   Specialties: Vascular Surgery, Cardiology, Radiology, Vascular Surgery Why: Post Op Follow up from Open Left Carotid endarterectomy.  Bilateral Carotid duplex arterial ultrasounds, Contact information: [address] [place] [address] [phone]                 Signed: Gwendlyn JONELLE Shank, NP  09/15/2023, 10:01 AM

## 2023-09-19 ENCOUNTER — Other Ambulatory Visit: Payer: Self-pay | Admitting: Neurology

## 2023-09-19 DIAGNOSIS — R4189 Other symptoms and signs involving cognitive functions and awareness: Secondary | ICD-10-CM

## 2023-09-23 ENCOUNTER — Ambulatory Visit
Admission: RE | Admit: 2023-09-23 | Discharge: 2023-09-23 | Disposition: A | Discharge status: Home / Self Care | Source: Ambulatory Visit | Attending: Neurology | Admitting: Neurology

## 2023-09-23 DIAGNOSIS — Q048 Other specified congenital malformations of brain: Secondary | ICD-10-CM | POA: Diagnosis not present

## 2023-09-23 DIAGNOSIS — R9089 Other abnormal findings on diagnostic imaging of central nervous system: Secondary | ICD-10-CM | POA: Diagnosis not present

## 2023-09-23 DIAGNOSIS — R4189 Other symptoms and signs involving cognitive functions and awareness: Secondary | ICD-10-CM | POA: Insufficient documentation

## 2023-09-23 DIAGNOSIS — R413 Other amnesia: Secondary | ICD-10-CM | POA: Diagnosis not present

## 2023-09-23 DIAGNOSIS — R9082 White matter disease, unspecified: Secondary | ICD-10-CM | POA: Diagnosis not present

## 2023-09-27 ENCOUNTER — Emergency Department

## 2023-09-27 ENCOUNTER — Observation Stay
Admission: EM | Admit: 2023-09-27 | Discharge: 2023-09-28 | Disposition: A | Discharge status: Home Health | Attending: Internal Medicine | Admitting: Internal Medicine

## 2023-09-27 DIAGNOSIS — M6281 Muscle weakness (generalized): Secondary | ICD-10-CM | POA: Insufficient documentation

## 2023-09-27 DIAGNOSIS — Z7982 Long term (current) use of aspirin: Secondary | ICD-10-CM | POA: Insufficient documentation

## 2023-09-27 DIAGNOSIS — R55 Syncope and collapse: Principal | ICD-10-CM | POA: Diagnosis present

## 2023-09-27 DIAGNOSIS — R262 Difficulty in walking, not elsewhere classified: Secondary | ICD-10-CM | POA: Insufficient documentation

## 2023-09-27 DIAGNOSIS — Z95828 Presence of other vascular implants and grafts: Secondary | ICD-10-CM | POA: Diagnosis not present

## 2023-09-27 DIAGNOSIS — R519 Headache, unspecified: Secondary | ICD-10-CM | POA: Diagnosis not present

## 2023-09-27 DIAGNOSIS — F10920 Alcohol use, unspecified with intoxication, uncomplicated: Secondary | ICD-10-CM | POA: Insufficient documentation

## 2023-09-27 DIAGNOSIS — E785 Hyperlipidemia, unspecified: Secondary | ICD-10-CM | POA: Diagnosis not present

## 2023-09-27 DIAGNOSIS — R079 Chest pain, unspecified: Secondary | ICD-10-CM | POA: Diagnosis not present

## 2023-09-27 LAB — COMPREHENSIVE METABOLIC PANEL WITH GFR
ALT: 28 U/L (ref 0–44)
AST: 27 U/L (ref 15–41)
Albumin: 4.1 g/dL (ref 3.5–5.0)
Alkaline Phosphatase: 97 U/L (ref 38–126)
Anion gap: 11 (ref 5–15)
BUN: 14 mg/dL (ref 8–23)
CO2: 26 mmol/L (ref 22–32)
Calcium: 9.3 mg/dL (ref 8.9–10.3)
Chloride: 98 mmol/L (ref 98–111)
Creatinine, Ser: 0.65 mg/dL (ref 0.44–1.00)
GFR, Estimated: >60 mL/min (ref >60)
Glucose, Bld: 138 mg/dL — ABNORMAL HIGH (ref 70–99)
Potassium: 4.4 mmol/L (ref 3.5–5.1)
Sodium: 135 mmol/L (ref 135–145)
Total Bilirubin: 0.4 mg/dL (ref 0.0–1.2)
Total Protein: 6.9 g/dL (ref 6.5–8.1)

## 2023-09-27 LAB — URINALYSIS, W/ REFLEX TO CULTURE (INFECTION SUSPECTED)
Bacteria, UA: NONE SEEN
Bilirubin Urine: NEGATIVE
Glucose, UA: NEGATIVE mg/dL
Hgb urine dipstick: NEGATIVE
Ketones, ur: NEGATIVE mg/dL
Leukocytes,Ua: NEGATIVE
Nitrite: NEGATIVE
Protein, ur: NEGATIVE mg/dL
Specific Gravity, Urine: 1.003 — ABNORMAL LOW (ref 1.005–1.030)
WBC, UA: 0 WBC/hpf (ref 0–5)
pH: 6 (ref 5.0–8.0)

## 2023-09-27 LAB — CBC
HCT: 40.6 % (ref 36.0–46.0)
Hemoglobin: 13 g/dL (ref 12.0–15.0)
MCH: 29 pg (ref 26.0–34.0)
MCHC: 32 g/dL (ref 30.0–36.0)
MCV: 90.4 fL (ref 80.0–100.0)
Platelets: 366 K/uL (ref 150–400)
RBC: 4.49 MIL/uL (ref 3.87–5.11)
RDW: 13.4 % (ref 11.5–15.5)
WBC: 7 K/uL (ref 4.0–10.5)
nRBC: 0 % (ref 0.0–0.2)

## 2023-09-27 LAB — TROPONIN I (HIGH SENSITIVITY): Troponin I (High Sensitivity): 5 ng/L (ref <18)

## 2023-09-27 LAB — TSH: TSH: 0.709 u[IU]/mL (ref 0.350–4.500)

## 2023-09-27 MED ORDER — ENOXAPARIN SODIUM 40 MG/0.4ML IJ SOSY
40.0000 mg | PREFILLED_SYRINGE | INTRAMUSCULAR | Status: DC
Start: 2023-09-27 — End: 2023-09-28
  Filled 2023-09-27: qty 0.4

## 2023-09-27 MED ORDER — ONDANSETRON HCL 4 MG PO TABS: 4.0000 mg | ORAL_TABLET | Freq: Four times a day (QID) | ORAL | Status: DC | PRN

## 2023-09-27 MED ORDER — ASPIRIN 81 MG PO TBEC
81.0000 mg | DELAYED_RELEASE_TABLET | Freq: Every day | ORAL | Status: DC
  Administered 2023-09-27 – 2023-09-28 (×2): 81 mg via ORAL
  Filled 2023-09-27 (×2): qty 1

## 2023-09-27 MED ORDER — OXYCODONE HCL 5 MG PO TABS: 5.0000 mg | ORAL_TABLET | ORAL | Status: DC | PRN

## 2023-09-27 MED ORDER — HYDRALAZINE HCL 20 MG/ML IJ SOLN: 10.0000 mg | Freq: Four times a day (QID) | INTRAMUSCULAR | Status: DC | PRN

## 2023-09-27 MED ORDER — ALBUTEROL SULFATE (2.5 MG/3ML) 0.083% IN NEBU: 2.5000 mg | INHALATION_SOLUTION | RESPIRATORY_TRACT | Status: DC | PRN

## 2023-09-27 MED ORDER — ACETAMINOPHEN 325 MG PO TABS
650.0000 mg | ORAL_TABLET | Freq: Four times a day (QID) | ORAL | Status: DC | PRN
  Administered 2023-09-27 – 2023-09-28 (×3): 650 mg via ORAL
  Filled 2023-09-27 (×3): qty 2

## 2023-09-27 MED ORDER — ACETAMINOPHEN 650 MG RE SUPP: 650.0000 mg | Freq: Four times a day (QID) | RECTAL | Status: DC | PRN

## 2023-09-27 MED ORDER — ATORVASTATIN CALCIUM 20 MG PO TABS
40.0000 mg | ORAL_TABLET | Freq: Every day | ORAL | Status: DC
  Administered 2023-09-27 – 2023-09-28 (×2): 40 mg via ORAL
  Filled 2023-09-27 (×2): qty 2

## 2023-09-27 MED ORDER — ONDANSETRON HCL 4 MG/2ML IJ SOLN
4.0000 mg | Freq: Four times a day (QID) | INTRAMUSCULAR | Status: DC | PRN
Start: 2023-09-27 — End: 2023-09-28

## 2023-09-27 NOTE — ED Notes (Signed)
 Pt taken to CT.

## 2023-09-27 NOTE — ED Provider Notes (Signed)
 [facility] Provider Note    Event Date/Time   First MD Initiated Contact with Patient 09/27/23 1421     (approximate)   History   No chief complaint on file.   HPI  Julia Snyder is a 78 y.o. female past medical history significant for hyperlipidemia, carotid artery stenosis with recent carotid endarterectomy with vascular surgery, who presents to the emergency department following an episode of near syncope.  History is provided by the patient's husband who is at bedside.  States that she was in her normal state of health this morning, got up and they were watching YouTube in the kitchen and all of a sudden stated that she did not feel well.  States that she broke out into a sweat, was having lightheadedness and had to lay her head down on the table with generalized weakness.  States that this was lasting for approximately 15 minutes, her husband wanted to call 911 but she did not want to.  He tried checking her blood pressure with the blood pressure cuff that they had at home and the only thing that would pick up was a heart rate that was reading at 170 and a blood pressure that was not reading.  States that she then laid down in the bed for approximately 20 minutes and when she was starting to feel better they went to the nurse at their nursing facility who sent him in to the emergency department.  Patient states she is feeling much better now.  Denies chest pain, shortness of breath, nausea or vomiting.  Endorses ongoing fatigue which has not been a new issue for her.  No weight loss or weight gain.  Recent echocardiogram with cardiology for clearance for her carotid endarterectomy that was done 2 weeks ago and they were told was normal.  Complains of a mild headache, denies thunderclap headache, this headache is new for her.  MyChart review unable to see her echocardiogram I do see that she had an endarterectomy with Dr. Jama on 09/14/2023     Physical Exam    Triage Vital Signs: ED Triage Vitals  Encounter Vitals Group     BP 09/27/23 1149 (!) 145/56     Girls Systolic BP Percentile --      Girls Diastolic BP Percentile --      Boys Systolic BP Percentile --      Boys Diastolic BP Percentile --      Pulse Rate 09/27/23 1149 65     Resp 09/27/23 1149 18     Temp 09/27/23 1149 98.3 F (36.8 C)     Temp Source 09/27/23 1149 Oral     SpO2 09/27/23 1149 99 %     Weight --      Height --      Head Circumference --      Peak Flow --      Pain Score 09/27/23 1150 0     Pain Loc --      Pain Education --      Exclude from Growth Chart --     Most recent vital signs: Vitals:   09/27/23 1149  BP: (!) 145/56  Pulse: 65  Resp: 18  Temp: 98.3 F (36.8 C)  SpO2: 99%    Physical Exam Constitutional:      Appearance: She is well-developed.  HENT:     Head: Atraumatic.  Eyes:     Conjunctiva/sclera: Conjunctivae normal.  Neck:     Comments: Wound  to the left neck with no surrounding erythema warmth or induration, appears well-healing Cardiovascular:     Rate and Rhythm: Regular rhythm.  Pulmonary:     Effort: No respiratory distress.  Abdominal:     General: There is no distension.  Musculoskeletal:        General: Normal range of motion.     Cervical back: Normal range of motion.  Skin:    General: Skin is warm.     Capillary Refill: Capillary refill takes less than 2 seconds.  Neurological:     Mental Status: She is alert. Mental status is at baseline.  Psychiatric:        Mood and Affect: Mood normal.     IMPRESSION / MDM / ASSESSMENT AND PLAN / ED COURSE  I reviewed the triage vital signs and the nursing notes.  Differential diagnosis including dysrhythmia, ACS, anemia, intracranial hemorrhage, electrolyte abnormality, dehydration  EKG  I, Clotilda Punter, the attending physician, personally viewed and interpreted this ECG.   Rate: Normal  Rhythm: Normal sinus  Axis: Normal  Intervals: Normal  ST&T Change:  None  No tachycardic or bradycardic dysrhythmias while on cardiac telemetry.  RADIOLOGY I independently reviewed imaging, my interpretation of imaging: CXR w/o without signs of pneumonia  LABS (all labs ordered are listed, but only abnormal results are displayed) Labs interpreted as -    Labs Reviewed  COMPREHENSIVE METABOLIC PANEL WITH GFR - Abnormal; Notable for the following components:      Result Value   Glucose, Bld 138 (*)    All other components within normal limits  CBC  TSH  URINALYSIS, W/ REFLEX TO CULTURE (INFECTION SUSPECTED)  TROPONIN I (HIGH SENSITIVITY)     MDM  No leukocytosis, creatinine to baseline with no significant electrolyte abnormality  Initial troponin is negative  Low suspicion for urinary tract infection and is otherwise asymptomatic but will obtain a UA given her significant fatigue recently, thyroid  studies within normal limits  Given this episode of tachycardia and syncope feel that it would be best to admit for cardiac telemetry.  Back to her mental status baseline.  Significant concern for dysrhythmia.     PROCEDURES:  Critical Care performed: No  Procedures  Patient's presentation is most consistent with acute presentation with potential threat to life or bodily function.   MEDICATIONS ORDERED IN ED: Medications - No data to display  FINAL CLINICAL IMPRESSION(S) / ED DIAGNOSES   Final diagnoses:  Syncope, unspecified syncope type     Rx / DC Orders   ED Discharge Orders     None        Note:  This document was prepared using Dragon voice recognition software and may include unintentional dictation errors.   Punter Clotilda, MD 09/27/23 1540

## 2023-09-27 NOTE — ED Notes (Signed)
 Pt stating need to void, pt taken to restroom by this RN.

## 2023-09-27 NOTE — ED Provider Notes (Signed)
-----------------------------------------   3:51 PM on 09/27/2023 -----------------------------------------  I took over care on this patient with Dr. Suzanne.  I consulted and discussed the case with the hospitalist who agreed to evaluate for admission.   Jacolyn Pae, MD 09/27/23 [phone]

## 2023-09-27 NOTE — ED Triage Notes (Signed)
 Pt to ED from home with near syncope around 0900 this morning. Pt's husband states they were sitting at a table this morning and she became dizzy, barely responsive, and diaphoretic for roughly 30 minutes before coming back to baseline. A&O x4

## 2023-09-27 NOTE — ED Notes (Signed)
 Patient transported to CT

## 2023-09-27 NOTE — H&P (Signed)
 History and Physical    Julia Snyder FMW:[id number] DOB: [birth year] DOA: 09/27/2023  PCP: Abdul Fine, MD  Patient coming from: Home  I have personally briefly reviewed patient's old medical records in [facility]  Chief Complaint: Syncope  HPI: Julia Snyder is a 78 y.o. female with medical history significant of hyperlipidemia, recent carotid endarterectomy status post carotid artery stenosis presents the ED following episode of syncope/near syncope.  Patient does not have good memory for the event.  Majority of history is given by speaking with the ER physician, reading the medical record, speaking with patient's husband who was at bedside.  Patient was normal state of health and proximately 8 in the morning as patient husband were watching YouTube in the kitchen she stated to the husband that she did not feel well.  Broke out in a sudden sweat and endorsed lightheadedness.  She laid her head down on the table and was questionably unresponsive for a period of near 15 minutes.  Husband endorsed that he tried treating her blood pressure and the only thing he could pick up with a heart rate at 170.  Patient then lay down in the bed for 20 minutes and started to feel better.  Nurse at their assisted living facility directed him to the emergency room.  On my evaluation patient is resting comfortably in bed.  She is in no visible distress.  She answers her questions appropriately.  No arrhythmia noted on telemetry.  ED Course: Laboratory investigation completed in the ED overall reassuring.  Initial troponin negative.  Hospitalist contacted for admission.  Review of Systems: As per HPI otherwise 14 point review of systems negative.    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   Orthostatic hypotension 11/22/2022   Overactive bladder    Urge incontinence of urine    Vertigo 04/11/2018    Past Surgical History:  Procedure Laterality Date   BREAST BIOPSY Left 2004   benign   BUNIONECTOMY Bilateral    left ~2014, right 2018   COLONOSCOPY     ELBOW FRACTURE SURGERY Bilateral    right 2019, left ~2015   ENDARTERECTOMY Left 09/14/2023   Procedure: ENDARTERECTOMY, CAROTID;  Surgeon: Jama Cordella MATSU, MD;  Location: [facility] ORS;  Service: Vascular;  Laterality: Left;   FRACTURE SURGERY     KNEE ARTHROPLASTY Left 05/14/2022   Procedure: COMPUTER ASSISTED TOTAL KNEE ARTHROPLASTY - RNFA;  Surgeon: Mardee Lynwood SQUIBB, MD;  Location: [facility] ORS;  Service: Orthopedics;  Laterality: Left;   ORIF ELBOW FRACTURE Right 12/01/2017   Procedure: OPEN REDUCTION INTERNAL FIXATION (ORIF) ELBOW/OLECRANON FRACTURE;  Surgeon: Kathlynn Sharper, MD;  Location: [facility] ORS;  Service: Orthopedics;  Laterality: Right;   OTHER SURGICAL HISTORY  2015   left arm surgery with titanium plate     reports that she has quit smoking. Her smoking use included cigarettes. She has been exposed to tobacco smoke. She has never used smokeless tobacco. She reports current alcohol use. She reports that she does not use drugs.  Allergies  Allergen Reactions   Donepezil Other (See Comments)    Sedation.     Family History  Problem Relation Age of Onset   Stroke Mother    Heart disease Mother  Parkinson's disease Father    Diabetes Neg Hx    Cancer Neg Hx     Prior to Admission medications   Medication Sig Start Date End Date Taking? Authorizing Provider  alendronate (FOSAMAX) 70 MG tablet Take 70 mg by mouth. Patient not taking: Reported on 09/07/2023 08/17/23   [provider]  Ascorbic Acid (VITAMIN C) 1000 MG tablet Take 1,000 mg by mouth daily.    [provider]  aspirin  EC 81 MG tablet Take 1 tablet (81 mg total) by mouth daily at 6 (six) AM.  Swallow whole. 09/16/23   Pace, Gwendlyn SAUNDERS, NP  atorvastatin  (LIPITOR) 40 MG tablet Take 1 tablet (40 mg total) by mouth daily. Take 1/2 tablet for 1 week then increase to full tablet 06/29/23   Abdul Fine, MD  calcitonin, salmon, (MIACALCIN/FORTICAL) 200 UNIT/ACT nasal spray TAKE 1 SPRAY IN NOSTRIL DAILY Patient not taking: Reported on 09/07/2023 07/01/23   [provider]  Calcium  Carbonate-Vit D-Min (CALCIUM  1200 PO) Take 1,200 mg by mouth daily.    [provider]  Cholecalciferol 125 MCG (5000 UT) TABS Take 5,000 Units by mouth daily.    [provider]  Cyanocobalamin  (B-12 PO) Take 2,000 mcg by mouth daily.    [provider]  Multiple Vitamins-Minerals (MULTIPLE VITAMINS/WOMENS PO) Take 1 tablet by mouth daily.    [provider]  Omega-3 Fatty Acids (FISH OIL PO) Take 2,500 mg by mouth daily.    [provider]  oxyCODONE  (OXY IR/ROXICODONE ) 5 MG immediate release tablet Take 1-2 tablets (5-10 mg total) by mouth every 4 (four) hours as needed for moderate pain (pain score 4-6). 09/15/23   Pace, Brien R, NP  vitamin k 100 MCG tablet Take 100 mcg by mouth daily.    [provider]    Physical Exam: Vitals:   09/27/23 1149  BP: (!) 145/56  Pulse: 65  Resp: 18  Temp: 98.3 F (36.8 C)  TempSrc: Oral  SpO2: 99%   General: No apparent distress, patient appears well HEENT: Normocephalic, atraumatic Neck, supple, trachea midline, no tenderness Heart: Regular rate and rhythm, S1/S2 normal, no murmurs Lungs: Lungs clear.  Normal work of breathing.  Room air Abdomen: Soft, nontender, nondistended, positive bowel sounds Extremities: Normal, atraumatic, no clubbing or cyanosis, normal muscle tone Skin: No rashes or lesions, normal color Neurologic: Cranial nerves grossly intact, sensation intact, alert and oriented x3 Psychiatric: Normal affect   Labs on Admission: I have personally reviewed following labs and imaging  studies  CBC: Recent Labs  Lab 09/27/23 1152  WBC 7.0  HGB 13.0  HCT 40.6  MCV 90.4  PLT 366   Basic Metabolic Panel: Recent Labs  Lab 09/27/23 1152  NA 135  K 4.4  CL 98  CO2 26  GLUCOSE 138*  BUN 14  CREATININE 0.65  CALCIUM  9.3   GFR: Estimated Creatinine Clearance: 52.2 mL/min (by C-G formula based on SCr of 0.65 mg/dL). Liver Function Tests: Recent Labs  Lab 09/27/23 1152  AST 27  ALT 28  ALKPHOS 97  BILITOT 0.4  PROT 6.9  ALBUMIN 4.1   No results for input(s): LIPASE, AMYLASE in the last 168 hours. No results for input(s): AMMONIA in the last 168 hours. Coagulation Profile: No results for input(s): INR, PROTIME in the last 168 hours. Cardiac Enzymes: No results for input(s): CKTOTAL, CKMB, CKMBINDEX, TROPONINI in the last 168 hours. BNP (last 3 results) No results for input(s): PROBNP in the last 8760 hours. HbA1C:  No results for input(s): HGBA1C in the last 72 hours. CBG: No results for input(s): GLUCAP in the last 168 hours. Lipid Profile: No results for input(s): CHOL, HDL, LDLCALC, TRIG, CHOLHDL, LDLDIRECT in the last 72 hours. Thyroid  Function Tests: Recent Labs    09/27/23 1152  TSH 0.709   Anemia Panel: No results for input(s): VITAMINB12, FOLATE, FERRITIN, TIBC, IRON, RETICCTPCT in the last 72 hours. Urine analysis:    Component Value Date/Time   COLORURINE STRAW (A) 09/27/2023 1530   APPEARANCEUR CLEAR (A) 09/27/2023 1530   LABSPEC 1.003 (L) 09/27/2023 1530   PHURINE 6.0 09/27/2023 1530   GLUCOSEU NEGATIVE 09/27/2023 1530   HGBUR NEGATIVE 09/27/2023 1530   BILIRUBINUR NEGATIVE 09/27/2023 1530   KETONESUR NEGATIVE 09/27/2023 1530   PROTEINUR NEGATIVE 09/27/2023 1530   NITRITE NEGATIVE 09/27/2023 1530   LEUKOCYTESUR NEGATIVE 09/27/2023 1530    Radiological Exams on Admission: DG Chest Portable 1 View Result Date: 09/27/2023 CLINICAL DATA:  Chest pain EXAM: PORTABLE CHEST 1 VIEW  COMPARISON:  None Available. FINDINGS: The heart size and mediastinal contours are within normal limits. Both lungs are clear. The visualized skeletal structures are unremarkable. IMPRESSION: No active disease. Electronically Signed   By: Greig Pique M.D.   On: 09/27/2023 15:34    EKG: Independently reviewed.  Sinus rhythm  Assessment/Plan Principal Problem:   Near syncope  Near syncope/transient unresponsiveness Unclear etiology.  Possible concern for cardiac arrhythmia given husband's report of heart rate of 170.  Unclear whether patient truly lost consciousness.  No shaking or tongue biting or bowel or bladder incontinence to indicate underlying seizure disorder. Plan: Place in observation Check orthostatics Telemetry Therapy evaluations  Recent carotid endarterectomy Done approximately 2 weeks prior to presentation Plan: Check bilateral carotid Doppler Continue home aspirin   Hyperlipidemia PTA statin  DVT prophylaxis: SQ Lovenox  Code Status: Full Family Communication: Spouse at bedside Disposition Plan: Anticipate return to previous home environment Consults called: None Admission status: Observation, cardiac telemetry   Calvin KATHEE Robson MD [facility]   If 7PM-7AM, please contact night-coverage   09/27/2023, 4:06 PM

## 2023-09-28 ENCOUNTER — Observation Stay

## 2023-09-28 DIAGNOSIS — I6521 Occlusion and stenosis of right carotid artery: Secondary | ICD-10-CM | POA: Diagnosis not present

## 2023-09-28 DIAGNOSIS — R55 Syncope and collapse: Secondary | ICD-10-CM | POA: Diagnosis not present

## 2023-09-28 LAB — COMPREHENSIVE METABOLIC PANEL WITH GFR
ALT: 24 U/L (ref 0–44)
AST: 27 U/L (ref 15–41)
Albumin: 3.7 g/dL (ref 3.5–5.0)
Alkaline Phosphatase: 95 U/L (ref 38–126)
Anion gap: 9 (ref 5–15)
BUN: 15 mg/dL (ref 8–23)
CO2: 25 mmol/L (ref 22–32)
Calcium: 9 mg/dL (ref 8.9–10.3)
Chloride: 104 mmol/L (ref 98–111)
Creatinine, Ser: 0.61 mg/dL (ref 0.44–1.00)
GFR, Estimated: >60 mL/min (ref >60)
Glucose, Bld: 101 mg/dL — ABNORMAL HIGH (ref 70–99)
Potassium: 3.8 mmol/L (ref 3.5–5.1)
Sodium: 138 mmol/L (ref 135–145)
Total Bilirubin: 0.5 mg/dL (ref 0.0–1.2)
Total Protein: 6.4 g/dL — ABNORMAL LOW (ref 6.5–8.1)

## 2023-09-28 LAB — CBC
HCT: 38.4 % (ref 36.0–46.0)
Hemoglobin: 12.3 g/dL (ref 12.0–15.0)
MCH: 28.7 pg (ref 26.0–34.0)
MCHC: 32 g/dL (ref 30.0–36.0)
MCV: 89.5 fL (ref 80.0–100.0)
Platelets: 318 K/uL (ref 150–400)
RBC: 4.29 MIL/uL (ref 3.87–5.11)
RDW: 13.3 % (ref 11.5–15.5)
WBC: 6.1 K/uL (ref 4.0–10.5)
nRBC: 0 % (ref 0.0–0.2)

## 2023-09-28 MED ORDER — ATORVASTATIN CALCIUM 40 MG PO TABS: 40.0000 mg | ORAL_TABLET | Freq: Every day | ORAL | Status: DC

## 2023-09-28 NOTE — TOC Transition Note (Signed)
 Transition of Care [facility]) - Discharge Note   Patient Details  Name: Julia Snyder MRN: [id number] Date of Birth: [birth year]  Transition of [facility]) CM/SW Contact:  Lauraine JAYSON Carpen, LCSW Phone Number: 09/28/2023, 3:23 PM   Clinical Narrative: Patient has orders to discharge back to [facility] today. MD entered order for outpatient PT in discharge summary and CSW sent to [facility]. DME recommendation for a cane but patient already has one. No further concerns. CSW signing off.    Final next level of care: OP Rehab Barriers to Discharge: No Barriers Identified   Patient Goals and CMS Choice            Discharge Placement                  Name of family member notified: Nyaisha Simao Patient and family notified of of transfer: 09/28/23  Discharge Plan and Services Additional resources added to the After Visit Summary for                                       Social Drivers of Health (SDOH) Interventions SDOH Screenings   Food Insecurity: No Food Insecurity (09/28/2023)  Housing: Low Risk  (09/28/2023)  Transportation Needs: No Transportation Needs (09/28/2023)  Utilities: Not At Risk (09/28/2023)  Depression (PHQ2-9): Low Risk  (09/07/2023)  Financial Resource Strain: Low Risk  (09/06/2023)   Received from [facility]  Social Connections: Moderately Isolated (09/28/2023)  Tobacco Use: Medium Risk (09/27/2023)     Readmission Risk Interventions     No data to display

## 2023-09-28 NOTE — Plan of Care (Signed)
  Problem: Education: Goal: Knowledge of General Education information will improve Description: Including pain rating scale, medication(s)/side effects and non-pharmacologic comfort measures 09/28/2023 1515 by Arloa Dene KIDD, RN Outcome: Adequate for Discharge 09/28/2023 1148 by Arloa Dene KIDD, RN Outcome: Progressing   Problem: Health Behavior/Discharge Planning: Goal: Ability to manage health-related needs will improve 09/28/2023 1515 by Arloa Dene KIDD, RN Outcome: Adequate for Discharge 09/28/2023 1148 by Arloa Dene KIDD, RN Outcome: Progressing   Problem: Clinical Measurements: Goal: Ability to maintain clinical measurements within normal limits will improve 09/28/2023 1515 by Arloa Dene KIDD, RN Outcome: Adequate for Discharge 09/28/2023 1148 by Arloa Dene KIDD, RN Outcome: Progressing Goal: Will remain free from infection 09/28/2023 1515 by Arloa Dene KIDD, RN Outcome: Adequate for Discharge 09/28/2023 1148 by Arloa Dene KIDD, RN Outcome: Progressing Goal: Diagnostic test results will improve 09/28/2023 1515 by Arloa Dene KIDD, RN Outcome: Adequate for Discharge 09/28/2023 1148 by Arloa Dene KIDD, RN Outcome: Progressing Goal: Respiratory complications will improve 09/28/2023 1515 by Arloa Dene KIDD, RN Outcome: Adequate for Discharge 09/28/2023 1148 by Arloa Dene KIDD, RN Outcome: Progressing Goal: Cardiovascular complication will be avoided 09/28/2023 1515 by Arloa Dene KIDD, RN Outcome: Adequate for Discharge 09/28/2023 1148 by Arloa Dene KIDD, RN Outcome: Progressing   Problem: Activity: Goal: Risk for activity intolerance will decrease 09/28/2023 1515 by Arloa Dene KIDD, RN Outcome: Adequate for Discharge 09/28/2023 1148 by Arloa Dene KIDD, RN Outcome: Progressing   Problem: Nutrition: Goal: Adequate nutrition will be maintained 09/28/2023 1515 by Arloa Dene KIDD, RN Outcome: Adequate for Discharge 09/28/2023 1148 by  Arloa Dene KIDD, RN Outcome: Progressing   Problem: Coping: Goal: Level of anxiety will decrease 09/28/2023 1515 by Arloa Dene KIDD, RN Outcome: Adequate for Discharge 09/28/2023 1148 by Arloa Dene KIDD, RN Outcome: Progressing   Problem: Elimination: Goal: Will not experience complications related to bowel motility 09/28/2023 1515 by Arloa Dene KIDD, RN Outcome: Adequate for Discharge 09/28/2023 1148 by Arloa Dene KIDD, RN Outcome: Progressing Goal: Will not experience complications related to urinary retention 09/28/2023 1515 by Arloa Dene KIDD, RN Outcome: Adequate for Discharge 09/28/2023 1148 by Arloa Dene KIDD, RN Outcome: Progressing   Problem: Pain Managment: Goal: General experience of comfort will improve and/or be controlled 09/28/2023 1515 by Arloa Dene KIDD, RN Outcome: Adequate for Discharge 09/28/2023 1148 by Arloa Dene KIDD, RN Outcome: Progressing   Problem: Safety: Goal: Ability to remain free from injury will improve 09/28/2023 1515 by Arloa Dene KIDD, RN Outcome: Adequate for Discharge 09/28/2023 1148 by Arloa Dene KIDD, RN Outcome: Progressing   Problem: Skin Integrity: Goal: Risk for impaired skin integrity will decrease 09/28/2023 1515 by Arloa Dene KIDD, RN Outcome: Adequate for Discharge 09/28/2023 1148 by Arloa Dene KIDD, RN Outcome: Progressing

## 2023-09-28 NOTE — Care Management Obs Status (Signed)
 MEDICARE OBSERVATION STATUS NOTIFICATION   Patient Details  Name: KELLEY POLINSKY MRN: [id number] Date of Birth: 07/14/1945   Medicare Observation Status Notification Given:  Yes    Rojelio SHAUNNA Rattler 09/28/2023, 8:53 AM

## 2023-09-28 NOTE — Progress Notes (Signed)
 Occupational Therapy Evaluation Patient Details Name: Julia Snyder MRN: [id number] DOB: [birth year] Today's Date: 09/28/2023   History of Present Illness   Pt is a 78 y.o. female who is post s/p L carotid endarterectomy on 09/14/23. Presents to ED w/ near syncope. PMH: hyperlipidemia, carotid artery stenosis with recent carotid endarterectomy     Clinical Impressions Julia Snyder was seen for OT evaluation this date. Prior to hospital admission, pt was IND w/ ADLs. Pt lives w/ spouse. Pt currently requires MOD I for all bed mobility; SUPERVISION for STS and CGA w/o AD to ambulate ~400 ft. Pt endorses feeling some dizziness w/ mobility; no LOB noted during ambulation, however, pt occasionally trips and states she needs to remember to pick up her feet. Pt and spouse educated on OT role, energy conservation, and falls prevention strategies. No skilled OT needs identified at this time, will sign off. Do not anticipate the need for follow up OT services upon acute hospital DC.      If plan is discharge home, recommend the following:   Assistance with cooking/housework;Assist for transportation     Functional Status Assessment   Patient has had a recent decline in their functional status and demonstrates the ability to make significant improvements in function in a reasonable and predictable amount of time.     Equipment Recommendations   None recommended by OT     Recommendations for Other Services         Precautions/Restrictions   Precautions Precautions: None Restrictions Weight Bearing Restrictions Per Provider Order: No     Mobility Bed Mobility Overal bed mobility: Modified Independent                  Transfers Overall transfer level: Needs assistance Equipment used: None Transfers: Sit to/from Stand Sit to Stand: Supervision                  Balance Overall balance assessment: Needs assistance Sitting-balance support: Feet supported, No  upper extremity supported Sitting balance-Leahy Scale: Normal     Standing balance support: No upper extremity supported Standing balance-Leahy Scale: Good                             ADL either performed or assessed with clinical judgement   ADL Overall ADL's : Needs assistance/impaired                                       General ADL Comments: CGA w/o AD use to ambulate ~400 ft     Vision         Perception         Praxis         Pertinent Vitals/Pain Pain Assessment Pain Assessment: 0-10 Pain Score: 5  Pain Location: head Pain Descriptors / Indicators: Aching, Discomfort Pain Intervention(s): Limited activity within patient's tolerance, Monitored during session     Extremity/Trunk Assessment Upper Extremity Assessment Upper Extremity Assessment: Overall WFL for tasks assessed   Lower Extremity Assessment Lower Extremity Assessment: Overall WFL for tasks assessed       Communication Communication Communication: No apparent difficulties   Cognition Arousal: Alert Behavior During Therapy: WFL for tasks assessed/performed Cognition: Cognition impaired       Memory impairment (select all impairments): Short-term memory     OT - Cognition Comments: unable to recall the telemetry  box was attached to her                 Following commands: Intact       Cueing  General Comments   Cueing Techniques: Verbal cues;Gestural cues      Exercises     Shoulder Instructions      Home Living Family/patient expects to be discharged to:: Private residence Living Arrangements: Spouse/significant other Available Help at Discharge: Family;Available 24 hours/day Type of Home: House Home Access: Stairs to enter [employer] of Steps: 1/2 step   Home Layout: One level     Bathroom Shower/Tub: Tub/shower unit;Walk-in shower   Bathroom Toilet: Standard     Home Equipment: Agricultural consultant (2 wheels);Cane -  single point          Prior Functioning/Environment Prior Level of Function : Independent/Modified Independent                    OT Problem List: Decreased activity tolerance;Cardiopulmonary status limiting activity   OT Treatment/Interventions:        OT Goals(Current goals can be found in the care plan section)   Acute Rehab OT Goals Patient Stated Goal: to go home OT Goal Formulation: With patient/family Time For Goal Achievement: 09/28/23 Potential to Achieve Goals: Good   OT Frequency:       Co-evaluation              AM-PAC OT 6 Clicks Daily Activity     Outcome Measure Help from another person eating meals?: None Help from another person taking care of personal grooming?: None Help from another person toileting, which includes using toliet, bedpan, or urinal?: None Help from another person bathing (including washing, rinsing, drying)?: A Little Help from another person to put on and taking off regular upper body clothing?: None Help from another person to put on and taking off regular lower body clothing?: None 6 Click Score: 23   End of Session Equipment Utilized During Treatment: Gait belt  Activity Tolerance: Patient tolerated treatment well Patient left: in bed;with call bell/phone within reach;with family/visitor present  OT Visit Diagnosis: Other abnormalities of gait and mobility (R26.89)                Time: 8973-8952 OT Time Calculation (min): 21 min Charges:  OT General Charges $OT Visit: 1 Visit OT Evaluation $OT Eval Low Complexity: 1 Low  [place], Student OT   [employer] 09/28/2023, 12:39 PM

## 2023-09-28 NOTE — Plan of Care (Signed)
   Problem: Education: Goal: Knowledge of General Education information will improve Description Including pain rating scale, medication(s)/side effects and non-pharmacologic comfort measures Outcome: Progressing

## 2023-09-28 NOTE — Evaluation (Signed)
 Physical Therapy Evaluation Patient Details Name: Julia Snyder MRN: [id number] DOB: 1946-01-07 Today's Date: 09/28/2023  History of Present Illness  Pt is a 78 yo female that presented to ED for near syncope/syncope. PMH of MCI, orthostatic hypotension, vertigo, carotid endarterectomy (09/14/2023).  Clinical Impression  Patient alert, agreeable to PT, seated in bed with family at bedside. Reported HA. Per pt at baseline she lives at [facility] with her husband, no recent falls, independent in mobility.   She was able to perform bed mobility modI. Supervision for transfers due to pt complaints of lightheadedness/dizziness that improved with time, but present with all mobility. Pt endorsed she does feel this way at baseline, but current feelings are increased compared to normal. She was able to ambulate >247ft without AD, CGA-supervision. DGI score of 18 indicates pt's increased risk of falls. Pt stated she still feels unsteady compared to normal as well, agreeable to [facility] use at discharge.  Overall the patient demonstrated deficits (see PT Problem List) that impede the patient's functional abilities, safety, and mobility and would benefit from skilled PT intervention.          If plan is discharge home, recommend the following: Assist for transportation;Help with stairs or ramp for entrance   Can travel by private vehicle        Equipment Recommendations  (pt agreeable to use a SPC at discharge)  Recommendations for Other Services       Functional Status Assessment Patient has had a recent decline in their functional status and demonstrates the ability to make significant improvements in function in a reasonable and predictable amount of time.     Precautions / Restrictions Precautions Precautions: Fall Restrictions Weight Bearing Restrictions Per Provider Order: No      Mobility  Bed Mobility Overal bed mobility: Modified Independent                   Transfers Overall transfer level: Needs assistance Equipment used: None Transfers: Sit to/from Stand Sit to Stand: Supervision                Ambulation/Gait Ambulation/Gait assistance: Supervision, Contact guard assist Gait Distance (Feet): 220 Feet Assistive device: None   Gait velocity: initially decreased, improved with time        Stairs            Wheelchair Mobility     Tilt Bed    Modified Rankin (Stroke Patients Only)       Balance Overall balance assessment: Needs assistance Sitting-balance support: Feet supported, No upper extremity supported Sitting balance-Leahy Scale: Normal     Standing balance support: No upper extremity supported Standing balance-Leahy Scale: Good                   Standardized Balance Assessment Standardized Balance Assessment : Dynamic Gait Index   Dynamic Gait Index Level Surface: Mild Impairment Change in Gait Speed: Normal Gait with Horizontal Head Turns: Mild Impairment Gait with Vertical Head Turns: Mild Impairment Gait and Pivot Turn: Mild Impairment Step Over Obstacle: Mild Impairment Step Around Obstacles: Normal Steps: Mild Impairment (use of rail per discussion and clinical judgement) Total Score: 18       Pertinent Vitals/Pain Pain Assessment Pain Assessment: 0-10 Pain Score: 5  Pain Location: head Pain Descriptors / Indicators: Aching, Discomfort Pain Intervention(s): Limited activity within patient's tolerance, Monitored during session    Home Living Family/patient expects to be discharged to:: Private residence Living Arrangements: Spouse/significant other Available Help  at Discharge: Family;Available 24 hours/day Type of Home: House Home Access: Stairs to enter   [employer] of Steps: 1/2 step   Home Layout: One level Home Equipment: Agricultural consultant (2 wheels);Cane - single point Additional Comments: Lives at [facility]    Prior Function Prior Level of  Function : Independent/Modified Independent                     Extremity/Trunk Assessment   Upper Extremity Assessment Upper Extremity Assessment: Overall WFL for tasks assessed    Lower Extremity Assessment Lower Extremity Assessment: Overall WFL for tasks assessed       Communication   Communication Communication: No apparent difficulties    Cognition Arousal: Alert Behavior During Therapy: WFL for tasks assessed/performed                           PT - Cognition Comments: pt endorsed having dementia, STM deficits noted Following commands: Intact       Cueing Cueing Techniques: Verbal cues, Gestural cues     General Comments      Exercises     Assessment/Plan    PT Assessment Patient needs continued PT services  PT Problem List Decreased activity tolerance;Decreased balance;Decreased mobility       PT Treatment Interventions DME instruction;Balance training;Gait training;Neuromuscular re-education;Stair training;Functional mobility training;Patient/family education;Therapeutic activities;Therapeutic exercise    PT Goals (Current goals can be found in the Care Plan section)  Acute Rehab PT Goals Patient Stated Goal: to go home PT Goal Formulation: With patient Time For Goal Achievement: 10/12/23 Potential to Achieve Goals: Good    Frequency Min 1X/week     Co-evaluation               AM-PAC PT 6 Clicks Mobility  Outcome Measure Help needed turning from your back to your side while in a flat bed without using bedrails?: None Help needed moving from lying on your back to sitting on the side of a flat bed without using bedrails?: None Help needed moving to and from a bed to a chair (including a wheelchair)?: None Help needed standing up from a chair using your arms (e.g., wheelchair or bedside chair)?: None Help needed to walk in hospital room?: None Help needed climbing 3-5 steps with a railing? : None 6 Click Score: 24     End of Session   Activity Tolerance: Patient tolerated treatment well Patient left: in bed;with call bell/phone within reach Nurse Communication: Mobility status PT Visit Diagnosis: Other abnormalities of gait and mobility (R26.89);Difficulty in walking, not elsewhere classified (R26.2);Muscle weakness (generalized) (M62.81)    Time: 8941-8884 PT Time Calculation (min) (ACUTE ONLY): 17 min   Charges:   PT Evaluation $PT Eval Low Complexity: 1 Low PT Treatments $Therapeutic Activity: 8-22 mins PT General Charges $$ ACUTE PT VISIT: 1 Visit        Doyal Shams PT, DPT 12:55 PM,09/28/23

## 2023-09-28 NOTE — Discharge Summary (Addendum)
 Physician Discharge Summary   Patient: Julia Snyder MRN: [id number] DOB: June 01, 1945  Admit date:     09/27/2023  Discharge date: 09/28/23  Discharge Physician: AIDA CHO   PCP: Abdul Fine, MD   Recommendations at discharge:   Follow-up with PCP in 1 week  Discharge Diagnoses: Principal Problem:   Near syncope  Resolved Problems:   * No resolved hospital problems. *  Hospital Course:  Julia Snyder is a 78 y.o. female with medical history significant of hyperlipidemia, recent carotid endarterectomy status post carotid artery stenosis presents the ED following episode of syncope/near syncope.  According to her husband, they were watching YouTube videos on the kitchen when patient complained that she was feeling unwell.  Husband noticed that patient had broken out in a sweat and complained of feeling lightheaded.  She laid her head down on the table and was minimally responsive for a period of close to 15 minutes.  Husband tried checking her blood pressure but he noticed that her heart rate was up ito 170.  Patient was made to lay down in bed for about 20 minutes and she felt better.  However, nursing staff at the assisted living facility recommended that she come to the ED for further evaluation.   Assessment and Plan:   S/p near syncope: Etiology unclear.  She was not orthostatic.   Orthostatic VS for the past 24 hrs:  BP- Lying Pulse- Lying BP- Sitting Pulse- Sitting BP- Standing at 0 minutes Pulse- Standing at 0 minutes  09/28/23 0830 150/62 75 164/78 81 164/87 95    Cardiac telemetry showed normal sinus rhythm.  No arrhythmias noted. Carotid ultrasound showed proximal right internal carotid artery stenosis measuring less than 50% and no evidence of significant arthrosclerotic changes in the left carotid system. S/p recent left carotid endarterectomy on 09/14/2023. Continue low-dose aspirin  and Lipitor. 2D echo on 08/05/2023 showed normal EF estimated at greater  than 55%, normal LA pressures with normal diastolic function, no valvular stenosis, trivial MR, trivial TR, otherwise no other valvular regurgitation.   She reported intermittent/chronic dizziness which is nothing new for her.  She ambulated with PT and home PT and a cane were recommended for safety. Patient can have outpatient physical therapy at [facility] facility.   Hyperlipidemia: Continue Lipitor   Her condition has improved.  She is eager to go home.  She is deemed stable for discharge to home today.  She lives at [facility] assisted living facility.  Discharge plan was discussed with the patient and her husband at the bedside.      Consultants: None Procedures performed: None Disposition: Assisted living Diet recommendation:  Discharge Diet Orders (From admission, onward)     Start     Ordered   09/28/23 0000  Diet - low sodium heart healthy        09/28/23 1419           Cardiac diet DISCHARGE MEDICATION: Allergies as of 09/28/2023       Reactions   Donepezil Other (See Comments)   Sedation.         Medication List     STOP taking these medications    calcitonin (salmon) 200 UNIT/ACT nasal spray Commonly known as: MIACALCIN/FORTICAL       TAKE these medications    alendronate 70 MG tablet Commonly known as: FOSAMAX Take 70 mg by mouth once a week.   aspirin  EC 81 MG tablet Take 1 tablet (81 mg total) by mouth daily  at 6 (six) AM. Swallow whole.   atorvastatin  40 MG tablet Commonly known as: LIPITOR Take 1 tablet (40 mg total) by mouth daily.   B-12 PO Take 2,000 mcg by mouth daily.   CALCIUM  1200 PO Take 1,200 mg by mouth daily.   Cholecalciferol 125 MCG (5000 UT) Tabs Take 5,000 Units by mouth daily.   FISH OIL PO Take 2,500 mg by mouth daily.   MULTIPLE VITAMINS/WOMENS PO Take 1 tablet by mouth daily.   oxyCODONE  5 MG immediate release tablet Commonly known as: Oxy IR/ROXICODONE  Take 1-2 tablets (5-10 mg total) by mouth every  4 (four) hours as needed for moderate pain (pain score 4-6).   vitamin C 1000 MG tablet Take 1,000 mg by mouth daily.   vitamin k 100 MCG tablet Take 100 mcg by mouth daily.               Durable Medical Equipment  (From admission, onward)           Start     Ordered   09/28/23 1420  DME Cane  Once        09/28/23 1419            Discharge Exam:  GEN: NAD SKIN: Warm and dry EYES: No pallor or icterus ENT: MMM CV: RRR PULM: CTA B ABD: soft, ND, NT, +BS CNS: AAO x 3, non focal EXT: No edema or tenderness   Condition at discharge: good  The results of significant diagnostics from this hospitalization (including imaging, microbiology, ancillary and laboratory) are listed below for reference.   Imaging Studies: US  Carotid Bilateral Result Date: 09/28/2023 CLINICAL DATA:  Near syncope EXAM: BILATERAL CAROTID DUPLEX ULTRASOUND TECHNIQUE: Elnor scale imaging, color Doppler and duplex ultrasound were performed of bilateral carotid and vertebral arteries in the neck. COMPARISON:  CT angiogram of the neck performed July 05, 2023 FINDINGS: Criteria: Quantification of carotid stenosis is based on velocity parameters that correlate the residual internal carotid diameter with NASCET-based stenosis levels, using the diameter of the distal internal carotid lumen as the denominator for stenosis measurement. The following velocity measurements were obtained: RIGHT ICA: 87 cm/sec CCA: 91 cm/sec SYSTOLIC ICA/CCA RATIO:  1.0 ECA: 144 cm/sec LEFT ICA: 115 cm/sec CCA: 64 cm/sec SYSTOLIC ICA/CCA RATIO:  1.8 ECA: 57 cm/sec RIGHT CAROTID ARTERY: Mild atherosclerotic changes, patent. RIGHT VERTEBRAL ARTERY:  Antegrade. LEFT CAROTID ARTERY: No significant stenosis is appreciated, antegrade flow. LEFT VERTEBRAL ARTERY:  Antegrade. IMPRESSION: 1. Right carotid system: Mild atherosclerotic changes in the proximal right internal carotid artery is estimated stenosis measuring less than 50%. 2. Left  carotid system: No evidence of significant atherosclerotic change or flow-limiting stenosis. Within normal limits. Electronically Signed   By: Maude Naegeli M.D.   On: 09/28/2023 09:12   CT Head Wo Contrast Result Date: 09/27/2023 CLINICAL DATA:  Headache, new onset (Age >= 51y) near syncope around 0900 this morning. Pt's husband states they were sitting at a table this morning and she became dizzy, barely responsive, and diaphoretic for roughly 30 minutes before coming back to baseline EXAM: CT HEAD WITHOUT CONTRAST TECHNIQUE: Contiguous axial images were obtained from the base of the skull through the vertex without intravenous contrast. RADIATION DOSE REDUCTION: This exam was performed according to the departmental dose-optimization program which includes automated exposure control, adjustment of the mA and/or kV according to patient size and/or use of iterative reconstruction technique. COMPARISON:  MRI head 09/23/2023 FINDINGS: Brain: Patchy and confluent areas of decreased attenuation are noted  throughout the deep and periventricular white matter of the cerebral hemispheres bilaterally, compatible with chronic microvascular ischemic disease. No evidence of large-territorial acute infarction. No parenchymal hemorrhage. No mass lesion. No extra-axial collection. No mass effect or midline shift. No hydrocephalus. Basilar cisterns are patent. Vascular: No hyperdense vessel. Skull: No acute fracture or focal lesion. Sinuses/Orbits: Paranasal sinuses and mastoid air cells are clear. The orbits are unremarkable. Other: None. IMPRESSION: No acute intracranial abnormality. Electronically Signed   By: Morgane  Naveau M.D.   On: 09/27/2023 17:03   DG Chest Portable 1 View Result Date: 09/27/2023 CLINICAL DATA:  Chest pain EXAM: PORTABLE CHEST 1 VIEW COMPARISON:  None Available. FINDINGS: The heart size and mediastinal contours are within normal limits. Both lungs are clear. The visualized skeletal structures are  unremarkable. IMPRESSION: No active disease. Electronically Signed   By: Greig Pique M.D.   On: 09/27/2023 15:34   MR BRAIN WO CONTRAST Result Date: 09/23/2023 CLINICAL DATA:  Memory loss. EXAM: MRI HEAD WITHOUT CONTRAST TECHNIQUE: Multiplanar, multiecho pulse sequences of the brain and surrounding structures were obtained without intravenous contrast. Additionally, using NeuroQuant software a 3D volumetric analysis of the brain was performed and is compared to a normative database adjusted for age, gender and intracranial volume. COMPARISON:  CT angiogram of the head dated July 05, 2023. FINDINGS: Brain: Age commensurate cerebral and cerebellar volume loss. No evidence of restricted diffusion to indicate acute or recent infarction. No evidence of hemorrhage, mass or hydrocephalus. Mild to moderate periventricular, subcortical and deep cerebral white matter disease. Developmental venous anomaly within the pons. Vascular: Normal vascular flow voids. Skull and upper cervical spine: Normal marrow signal. No osseous lesions. Sinuses/Orbits: Clear paranasal sinuses.  Normal orbits. Other: None. NeuroQuant Findings: Volumetric analysis of the brain was performed, with a fully detailed report in [facility]. Briefly, the comparison with age and gender matched reference reveals within normal limits. IMPRESSION: 1. Age-related cerebral and cerebellar volume loss and mild to moderate cerebral white matter disease. 2. NeuroQuant volumetric analysis of the brain, see details on [employer]. Electronically Signed   By: Evalene Coho M.D.   On: 09/23/2023 19:48    Microbiology: Results for orders placed or performed during the hospital encounter of 08/31/23  Surgical pcr screen     Status: None   Collection Time: 08/31/23  9:04 AM   Specimen: Nasal Mucosa; Nasal Swab  Result Value Ref Range Status   MRSA, PCR NEGATIVE NEGATIVE Final   Staphylococcus aureus NEGATIVE NEGATIVE Final    Comment: (NOTE) The Xpert  SA Assay (FDA approved for NASAL specimens in patients 50 years of age and older), is one component of a comprehensive surveillance program. It is not intended to diagnose infection nor to guide or monitor treatment. Performed at [facility], [address] [place], [place] [address]     Labs: CBC: Recent Labs  Lab 09/27/23 1152 09/28/23 0356  WBC 7.0 6.1  HGB 13.0 12.3  HCT 40.6 38.4  MCV 90.4 89.5  PLT 366 318   Basic Metabolic Panel: Recent Labs  Lab 09/27/23 1152 09/28/23 0356  NA 135 138  K 4.4 3.8  CL 98 104  CO2 26 25  GLUCOSE 138* 101*  BUN 14 15  CREATININE 0.65 0.61  CALCIUM  9.3 9.0   Liver Function Tests: Recent Labs  Lab 09/27/23 1152 09/28/23 0356  AST 27 27  ALT 28 24  ALKPHOS 97 95  BILITOT 0.4 0.5  PROT 6.9 6.4*  ALBUMIN 4.1  3.7   CBG: No results for input(s): GLUCAP in the last 168 hours.  Discharge time spent: greater than 30 minutes.  Signed: AIDA CHO, MD [facility] 09/28/2023

## 2023-09-28 NOTE — Plan of Care (Signed)

## 2023-10-07 ENCOUNTER — Ambulatory Visit (INDEPENDENT_AMBULATORY_CARE_PROVIDER_SITE_OTHER): Admitting: Nurse Practitioner

## 2023-10-07 ENCOUNTER — Encounter (INDEPENDENT_AMBULATORY_CARE_PROVIDER_SITE_OTHER): Payer: Self-pay | Admitting: Nurse Practitioner

## 2023-10-07 ENCOUNTER — Encounter (INDEPENDENT_AMBULATORY_CARE_PROVIDER_SITE_OTHER)

## 2023-10-07 VITALS — BP 139/76 | HR 59 | Ht 63.0 in | Wt 132.1 lb

## 2023-10-07 DIAGNOSIS — R55 Syncope and collapse: Secondary | ICD-10-CM

## 2023-10-07 DIAGNOSIS — I6523 Occlusion and stenosis of bilateral carotid arteries: Secondary | ICD-10-CM

## 2023-10-07 DIAGNOSIS — R2689 Other abnormalities of gait and mobility: Secondary | ICD-10-CM | POA: Diagnosis not present

## 2023-10-07 DIAGNOSIS — R2681 Unsteadiness on feet: Secondary | ICD-10-CM | POA: Diagnosis not present

## 2023-10-07 DIAGNOSIS — I6522 Occlusion and stenosis of left carotid artery: Secondary | ICD-10-CM | POA: Diagnosis not present

## 2023-10-07 NOTE — Progress Notes (Signed)
 Subjective:    Patient ID: Julia Snyder, female    DOB: Dec 18, 1945, 78 y.o.   MRN: [id number] Chief Complaint  Patient presents with   Follow up with Schnier, Cordella MATSU, MD (Vascular Surgery) in    The patient is seen for follow up evaluation of carotid stenosis status post left carotid endarterectomy on 09/14/2023.  There were no post operative problems or complications related to the surgery.  The patient denies neck or incisional pain.  The patient denies interval amaurosis fugax. There is no recent history of TIA symptoms or focal motor deficits. There is no prior documented CVA.  The patient has had occasional headaches but nothing significant.  She did have an episode of near syncope.  She felt somewhat dizzy and lightheaded and went to lay down and these issues resolved after 20 minutes.  However the nurse at the assisted living facility recommended that she present to the hospital for evaluation.  In the midst of the studies no obvious cause for the near syncopal event happened.  The patient's husband notes that during this event he tried to take her blood pressure but the machine would not read but her heart rate was in the 170s  The patient is taking enteric-coated aspirin  81 mg daily.  No recent shortening of the patient's walking distance or new symptoms consistent with claudication.  No history of rest pain symptoms. No new ulcers or wounds of the lower extremities have occurred.  There is no history of DVT, PE or superficial thrombophlebitis. No recent episodes of angina or shortness of breath documented.   She had a carotid duplex during that hospitalization which showed no significant stenosis of left ICA with less than 50% of the right.     Review of Systems  Neurological:  Positive for numbness and headaches.  All other systems reviewed and are negative.      Objective:   Physical Exam Vitals reviewed.  HENT:     Head: Normocephalic.  Cardiovascular:      Rate and Rhythm: Normal rate.     Pulses: Normal pulses.  Pulmonary:     Effort: Pulmonary effort is normal.  Skin:    General: Skin is warm and dry.  Neurological:     Mental Status: She is alert and oriented to person, place, and time. Mental status is at baseline.     Cranial Nerves: Cranial nerves 2-12 are intact.  Psychiatric:        Mood and Affect: Mood normal.        Behavior: Behavior normal.        Thought Content: Thought content normal.        Cognition and Memory: Memory is impaired.        Judgment: Judgment normal.     BP 139/76   Pulse (!) 59   Ht 5' 3 (1.6 m)   Wt 132 lb 2 oz (59.9 kg)   BMI 23.40 kg/m   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   Orthostatic hypotension 11/22/2022   Overactive bladder    Urge incontinence of urine    Vertigo 04/11/2018    Social History   Socioeconomic History   Marital status: Married    Spouse name: Morton  Number of children: 1   Years of education: Not on file   Highest education level: Not on file  Occupational History   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  Tobacco Use   Smoking status: Former    Types: Cigarettes    Passive exposure: Past   Smokeless tobacco: Never   Tobacco comments:    Quit date was unknown   Advertising account planner   Vaping status: Never Used  Substance and Sexual Activity   Alcohol use: Yes    Comment: occassional   Drug use: Never   Sexual activity: Not on file  Other Topics Concern   Not on file  Social History Narrative   Has living will   Husband is health care POA--then daughter   Requests DNR--done 03/02/21   Not sure about feeding tube   Social Drivers of Health   Financial Resource Strain: Low Risk   (09/06/2023)   Received from [facility]   Overall Financial Resource Strain (CARDIA)    Difficulty of Paying Living Expenses: Not hard at all  Food Insecurity: No Food Insecurity (09/28/2023)   Hunger Vital Sign    Worried About Running Out of Food in the Last Year: Never true    Ran Out of Food in the Last Year: Never true  Transportation Needs: No Transportation Needs (09/28/2023)   PRAPARE - Administrator, Civil Service (Medical): No    Lack of Transportation (Non-Medical): No  Physical Activity: Not on file  Stress: Not on file  Social Connections: Moderately Isolated (09/28/2023)   Social Connection and Isolation Panel    Frequency of Communication with Friends and Family: Never    Frequency of Social Gatherings with Friends and Family: Never    Attends Religious Services: Never    Database administrator or Organizations: No    Attends Engineer, structural: More than 4 times per year    Marital Status: Married  Catering manager Violence: Not At Risk (09/28/2023)   Humiliation, Afraid, Rape, and Kick questionnaire    Fear of Current or Ex-Partner: No    Emotionally Abused: No    Physically Abused: No    Sexually Abused: No    Past Surgical History:  Procedure Laterality Date   BREAST BIOPSY Left 2004   benign   BUNIONECTOMY Bilateral    left ~2014, right 2018   COLONOSCOPY     ELBOW FRACTURE SURGERY Bilateral    right 2019, left ~2015   ENDARTERECTOMY Left 09/14/2023   Procedure: ENDARTERECTOMY, CAROTID;  Surgeon: Jama Cordella MATSU, MD;  Location: [facility];  Service: Vascular;  Laterality: Left;   FRACTURE SURGERY     KNEE ARTHROPLASTY Left 05/14/2022   Procedure: COMPUTER ASSISTED TOTAL KNEE ARTHROPLASTY - RNFA;  Surgeon: Mardee Lynwood SQUIBB, MD;  Location: [facility];  Service: Orthopedics;  Laterality: Left;   ORIF ELBOW FRACTURE Right 12/01/2017   Procedure: OPEN REDUCTION INTERNAL FIXATION (ORIF) ELBOW/OLECRANON FRACTURE;  Surgeon: Kathlynn Sharper, MD;  Location: [facility];  Service: Orthopedics;  Laterality: Right;   OTHER SURGICAL HISTORY  2015   left arm surgery with titanium plate    Family History  Problem Relation Age of Onset   Stroke Mother    Heart disease Mother    Parkinson's disease Father    Diabetes Neg Hx    Cancer Neg Hx     Allergies  Allergen Reactions   Donepezil Other (See Comments)  Sedation.        Latest Ref Rng & Units 09/28/2023    3:56 AM 09/27/2023   11:52 AM 09/15/2023    5:48 AM  CBC  WBC 4.0 - 10.5 K/uL 6.1  7.0  7.8   Hemoglobin 12.0 - 15.0 g/dL 87.6  86.9  88.1   Hematocrit 36.0 - 46.0 % 38.4  40.6  36.4   Platelets 150 - 400 K/uL 318  366  237       CMP     Component Value Date/Time   NA 138 09/28/2023 0356   K 3.8 09/28/2023 0356   CL 104 09/28/2023 0356   CO2 25 09/28/2023 0356   GLUCOSE 101 (H) 09/28/2023 0356   BUN 15 09/28/2023 0356   CREATININE 0.61 09/28/2023 0356   CALCIUM  9.0 09/28/2023 0356   PROT 6.4 (L) 09/28/2023 0356   ALBUMIN 3.7 09/28/2023 0356   AST 27 09/28/2023 0356   ALT 24 09/28/2023 0356   ALKPHOS 95 09/28/2023 0356   BILITOT 0.5 09/28/2023 0356   GFR 81.90 03/24/2023 0850   GFRNONAA >60 09/28/2023 0356     No results found.     Assessment & Plan:   1. Bilateral carotid artery stenosis (Primary) Recommend:  The patient is s/p successful left CEA  Duplex ultrasound  shows no significant stenosis of the left ICA with less than 50% of the right  Continue antiplatelet therapy as prescribed Continue management of CAD, HTN and Hyperlipidemia Healthy heart diet,  encouraged exercise at least 4 times per week  The patient's NIHSS score is as follows: 1 Mild: 1 - 5 Mild to Moderately Severe: 5 - 14 Severe: 15 - 24 Very Severe: >25  Follow up in 6 months with duplex ultrasound and physical exam based on the patient's carotid surgery   2. Near syncope For the patient's near syncopal event there was not any found cause during her  hospitalization however based upon the description of symptoms and timing I suspect she may have been hypotensive.  It does not appear that this is an ongoing issue.  This can be addressed if it occurs again but I suspect that it was isolated.   Current Outpatient Medications on File Prior to Visit  Medication Sig Dispense Refill   alendronate (FOSAMAX) 70 MG tablet Take 70 mg by mouth once a week.     Ascorbic Acid (VITAMIN C) 1000 MG tablet Take 1,000 mg by mouth daily.     aspirin  EC 81 MG tablet Take 1 tablet (81 mg total) by mouth daily at 6 (six) AM. Swallow whole. 30 tablet 12   atorvastatin  (LIPITOR) 40 MG tablet Take 1 tablet (40 mg total) by mouth daily.     Calcium  Carbonate-Vit D-Min (CALCIUM  1200 PO) Take 1,200 mg by mouth daily.     Cholecalciferol 125 MCG (5000 UT) TABS Take 5,000 Units by mouth daily.     Cyanocobalamin  (B-12 PO) Take 2,000 mcg by mouth daily.     Multiple Vitamins-Minerals (MULTIPLE VITAMINS/WOMENS PO) Take 1 tablet by mouth daily.     Omega-3 Fatty Acids (FISH OIL PO) Take 2,500 mg by mouth daily.     oxyCODONE  (OXY IR/ROXICODONE ) 5 MG immediate release tablet Take 1-2 tablets (5-10 mg total) by mouth every 4 (four) hours as needed for moderate pain (pain score 4-6). 30 tablet 0   vitamin k 100 MCG tablet Take 100 mcg by mouth daily.     No current facility-administered medications on file prior  to visit.    There are no Patient Instructions on file for this visit. No follow-ups on file.   Dejanique Ruehl E Clevon Khader, NP

## 2023-10-11 DIAGNOSIS — R2689 Other abnormalities of gait and mobility: Secondary | ICD-10-CM | POA: Diagnosis not present

## 2023-10-11 DIAGNOSIS — I6522 Occlusion and stenosis of left carotid artery: Secondary | ICD-10-CM | POA: Diagnosis not present

## 2023-10-11 DIAGNOSIS — R55 Syncope and collapse: Secondary | ICD-10-CM | POA: Diagnosis not present

## 2023-10-11 DIAGNOSIS — R2681 Unsteadiness on feet: Secondary | ICD-10-CM | POA: Diagnosis not present

## 2023-10-13 DIAGNOSIS — R55 Syncope and collapse: Secondary | ICD-10-CM | POA: Diagnosis not present

## 2023-10-13 DIAGNOSIS — I6522 Occlusion and stenosis of left carotid artery: Secondary | ICD-10-CM | POA: Diagnosis not present

## 2023-10-13 DIAGNOSIS — R2681 Unsteadiness on feet: Secondary | ICD-10-CM | POA: Diagnosis not present

## 2023-10-13 DIAGNOSIS — R2689 Other abnormalities of gait and mobility: Secondary | ICD-10-CM | POA: Diagnosis not present

## 2023-10-14 ENCOUNTER — Ambulatory Visit: Payer: Self-pay

## 2023-10-14 NOTE — Telephone Encounter (Signed)
 FYI Only or Action Required?: Action required by provider: request for appointment.  Patient was last seen in primary care on 09/07/2023 by Abdul Fine, MD.  Called Nurse Triage reporting Fatigue.  Symptoms began several weeks ago.  Interventions attempted: Nothing.  Symptoms are: stable.Has had fatigue x several weeks. Lightheaded when getting up.  Triage Disposition: See PCP When Office is Open (Within 3 Days)  Patient/caregiver understands and will follow disposition?:   Copied from CRM [id number]. Topic: Clinical - Red Word Triage >> Oct 14, 2023  1:48 PM Brittney F wrote: [facility] that prompted transfer to Nurse Triage:   Increased fatigue; dizziness Reason for Disposition  [1] MILD weakness (e.g., does not interfere with ability to work, go to school, normal activities) AND [2] persists > 1 week  Answer Assessment - Initial Assessment Questions 1. DESCRIPTION: Describe how you are feeling.     Fatigue 2. SEVERITY: How bad is it?  Can you stand and walk?     yes 3. ONSET: When did these symptoms begin? (e.g., hours, days, weeks, months)     weeks 4. CAUSE: What do you think is causing the weakness or fatigue? (e.g., not drinking enough fluids, medical problem, trouble sleeping)     unsure 5. NEW MEDICINES:  Have you started on any new medicines recently? (e.g., opioid pain medicines, benzodiazepines, muscle relaxants, antidepressants, antihistamines, neuroleptics, beta blockers)     no 6. OTHER SYMPTOMS: Do you have any other symptoms? (e.g., chest pain, fever, cough, SOB, vomiting, diarrhea, bleeding, other areas of pain)     Gets lightheaded when getting up 7. PREGNANCY: Is there any chance you are pregnant? When was your last menstrual period?     no  Protocols used: Weakness (Generalized) and Fatigue-A-AH

## 2023-10-14 NOTE — Telephone Encounter (Signed)
Message routed to PCP Beamer, Victoria, MD  

## 2023-10-14 NOTE — Telephone Encounter (Signed)
 Nicholaus Sonny LABOR, RN to [facility] (Selected Message)     10/14/23  2:02 PM Appointment made, thanks.

## 2023-10-18 DIAGNOSIS — R55 Syncope and collapse: Secondary | ICD-10-CM | POA: Diagnosis not present

## 2023-10-18 DIAGNOSIS — R2689 Other abnormalities of gait and mobility: Secondary | ICD-10-CM | POA: Diagnosis not present

## 2023-10-18 DIAGNOSIS — I6522 Occlusion and stenosis of left carotid artery: Secondary | ICD-10-CM | POA: Diagnosis not present

## 2023-10-18 DIAGNOSIS — R2681 Unsteadiness on feet: Secondary | ICD-10-CM | POA: Diagnosis not present

## 2023-10-19 ENCOUNTER — Encounter: Payer: Self-pay | Admitting: Student

## 2023-10-19 ENCOUNTER — Ambulatory Visit: Attending: Student

## 2023-10-19 ENCOUNTER — Ambulatory Visit: Admitting: Student

## 2023-10-19 VITALS — BP 138/76 | HR 62 | Temp 97.6°F | Ht 63.0 in | Wt 135.8 lb

## 2023-10-19 DIAGNOSIS — R0609 Other forms of dyspnea: Secondary | ICD-10-CM

## 2023-10-19 DIAGNOSIS — R55 Syncope and collapse: Secondary | ICD-10-CM

## 2023-10-19 NOTE — Patient Instructions (Signed)
 VISIT SUMMARY:  You visited us  today due to fatigue and a recent fainting episode. After a surgical procedure, you fainted and were taken to the emergency department, where tests showed no acute issues. You have been experiencing occasional dizziness when standing up quickly and persistent fatigue, especially during physical activities. Your blood pressure is generally low, and you are staying hydrated. We discussed the possibility of heart-related issues and planned further evaluations.  YOUR PLAN:  -RECENT SYNCOPE WITH EXERTIONAL DYSPNEA, FATIGUE, AND ORTHOSTATIC DIZZINESS: Syncope means fainting, and dyspnea means difficulty breathing. You experienced a fainting episode two weeks after your surgery and have been feeling fatigued and dizzy when standing up quickly. We will place a heart monitor for long-term monitoring and order a stress test to check for any heart-related issues.  -CAROTID STENOSIS, STATUS POST LEFT CAROTID ENDARTERECTOMY: Carotid stenosis is the narrowing of the carotid arteries, which can affect blood flow to the brain. You had surgery to correct this about a month ago, and you seem more alert now. We will discuss with a cardiologist whether you need further evaluation for similar issues in your coronary arteries, which supply blood to your heart.  INSTRUCTIONS:  We will place a heart monitor for long-term monitoring and schedule a stress test to evaluate for any heart-related issues. Additionally, we will discuss with a cardiologist the potential need for further evaluation of your coronary arteries.

## 2023-10-19 NOTE — Progress Notes (Unsigned)
 EP to read.

## 2023-10-20 ENCOUNTER — Encounter: Payer: Self-pay | Admitting: Student

## 2023-10-20 DIAGNOSIS — R2689 Other abnormalities of gait and mobility: Secondary | ICD-10-CM | POA: Diagnosis not present

## 2023-10-20 DIAGNOSIS — I6522 Occlusion and stenosis of left carotid artery: Secondary | ICD-10-CM | POA: Diagnosis not present

## 2023-10-20 DIAGNOSIS — R55 Syncope and collapse: Secondary | ICD-10-CM | POA: Diagnosis not present

## 2023-10-20 DIAGNOSIS — R2681 Unsteadiness on feet: Secondary | ICD-10-CM | POA: Diagnosis not present

## 2023-10-20 NOTE — Progress Notes (Signed)
 [facility]  Provider: ABDUL  Code Status: Full Code.  Goals of Care:     09/27/2023   11:51 AM  Advanced Directives  Does Patient Have a Medical Advance Directive? Yes     Chief Complaint  Patient presents with   Fatigue    Fatigue and loss of stamina    HPI: Patient is a 78 y.o. female seen today for an acute visit for Hospital follow up. Discussed the use of AI scribe software for clinical note transcription with the patient, who gave verbal consent to proceed.  History of Present Illness   Julia Snyder is a 78 year old female who presents with fatigue and a recent fainting episode.  Approximately two weeks after a surgical procedure, she experienced a fainting episode while sitting at the kitchen table. She was taken to the emergency department and observed overnight. Diagnostic tests, including an EKG, carotid ultrasound, and CT scan of the head, showed no acute abnormalities. She is currently attending physical therapy for balance.  Since the fainting episode, she has not had further fainting spells but experiences occasional dizziness when standing up quickly. She is cautious about getting up slowly to prevent dizziness. Her husband noted a heart rate of 178 during the fainting episode, raising concern about potential atrial fibrillation, though no diagnosis was confirmed.  She experiences persistent fatigue and decreased stamina, particularly during exertion such as walking or climbing inclines. She has shortness of breath with exertion but no chest pain or palpitations during rest or activity. Her blood pressure is monitored daily, typically ranging from 95 to 115, with occasional readings as high as 140.  Her past medical history includes a recent endarterectomy, after which her husband noticed an improvement in her alertness and mood, and a history of knee replacement surgery. She is aware of her low blood pressure and maintains  hydration by drinking water  throughout the day.        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   Orthostatic hypotension 11/22/2022   Overactive bladder    Urge incontinence of urine    Vertigo 04/11/2018    Past Surgical History:  Procedure Laterality Date   BREAST BIOPSY Left 2004   benign   BUNIONECTOMY Bilateral    left ~2014, right 2018   COLONOSCOPY     ELBOW FRACTURE SURGERY Bilateral    right 2019, left ~2015   ENDARTERECTOMY Left 09/14/2023   Procedure: ENDARTERECTOMY, CAROTID;  Surgeon: Jama Cordella MATSU, MD;  Location: [facility];  Service: Vascular;  Laterality: Left;   FRACTURE SURGERY     KNEE ARTHROPLASTY Left 05/14/2022   Procedure: COMPUTER ASSISTED TOTAL KNEE ARTHROPLASTY - RNFA;  Surgeon: Mardee Lynwood SQUIBB, MD;  Location: [facility];  Service: Orthopedics;  Laterality: Left;   ORIF ELBOW FRACTURE Right 12/01/2017   Procedure: OPEN REDUCTION INTERNAL FIXATION (ORIF) ELBOW/OLECRANON FRACTURE;  Surgeon: Kathlynn Sharper, MD;  Location: [facility];  Service: Orthopedics;  Laterality: Right;   OTHER SURGICAL HISTORY  2015   left arm surgery with titanium plate    Allergies  Allergen Reactions   Donepezil Other (See Comments)    Sedation.     Outpatient Encounter Medications as of 10/19/2023  Medication  Sig   alendronate (FOSAMAX) 70 MG tablet Take 70 mg by mouth once a week.   Ascorbic Acid (VITAMIN C) 1000 MG tablet Take 1,000 mg by mouth daily.   aspirin  EC 81 MG tablet Take 1 tablet (81 mg total) by mouth daily at 6 (six) AM. Swallow whole.   atorvastatin  (LIPITOR) 40 MG tablet Take 1 tablet (40 mg total) by mouth daily.   Calcium  Carbonate-Vit D-Min (CALCIUM  1200 PO) Take 1,200 mg by mouth daily.   Cholecalciferol 125 MCG (5000 UT)  TABS Take 5,000 Units by mouth daily.   Cyanocobalamin  (B-12 PO) Take 2,000 mcg by mouth daily.   Multiple Vitamins-Minerals (MULTIPLE VITAMINS/WOMENS PO) Take 1 tablet by mouth daily.   Omega-3 Fatty Acids (FISH OIL PO) Take 2,500 mg by mouth daily.   oxyCODONE  (OXY IR/ROXICODONE ) 5 MG immediate release tablet Take 1-2 tablets (5-10 mg total) by mouth every 4 (four) hours as needed for moderate pain (pain score 4-6).   vitamin k 100 MCG tablet Take 100 mcg by mouth daily.   No facility-administered encounter medications on file as of 10/19/2023.    Review of Systems:  Review of Systems  Health Maintenance  Topic Date Due   Hepatitis B Vaccines (3 of 3 - 19+ 3-dose series) 11/20/2012   INFLUENZA VACCINE  10/14/2023   COVID-19 Vaccine (5 - 2024-25 season) 12/13/2023 (Originally 06/09/2023)   Medicare Annual Wellness (AWV)  03/23/2024   DTaP/Tdap/Td (2 - Td or Tdap) 11/30/2027   Pneumococcal Vaccine: 50+ Years  Completed   DEXA SCAN  Completed   Hepatitis C Screening  Completed   Zoster Vaccines- Shingrix  Completed   HPV VACCINES  Aged Out   Meningococcal B Vaccine  Aged Out   Colonoscopy  Discontinued    Physical Exam: Vitals:   10/19/23 1544  BP: 138/76  Pulse: 62  Temp: 97.6 F (36.4 C)  SpO2: 97%  Weight: 135 lb 12.8 oz (61.6 kg)  Height: 5' 3 (1.6 m)   Body mass index is 24.06 kg/m. Physical Exam Constitutional:      Appearance: Normal appearance.  Cardiovascular:     Rate and Rhythm: Normal rate and regular rhythm.     Pulses: Normal pulses.     Heart sounds: Normal heart sounds.  Pulmonary:     Effort: Pulmonary effort is normal.  Abdominal:     General: Abdomen is flat. Bowel sounds are normal.     Palpations: Abdomen is soft.  Musculoskeletal:        General: No swelling or tenderness.  Skin:    General: Skin is warm and dry.     Comments: EAC scar clean, dry, intact.  Neurological:     Mental Status: She is alert and oriented to person, place, and  time.     Gait: Gait normal.  Psychiatric:        Mood and Affect: Mood normal.     Labs reviewed: Basic Metabolic Panel: Recent Labs    03/24/23 0850 08/31/23 0852 09/15/23 0548 09/27/23 1152 09/28/23 0356  NA 139   < > 139 135 138  K 3.9   < > 3.6 4.4 3.8  CL 100   < > 105 98 104  CO2 29   < > 26 26 25   GLUCOSE 101*   < > 104* 138* 101*  BUN 13   < > 9 14 15   CREATININE 0.71   < > 0.57 0.65 0.61  CALCIUM  9.5   < > 8.5*  9.3 9.0  TSH 1.47  --   --  0.709  --    < > = values in this interval not displayed.   Liver Function Tests: Recent Labs    03/24/23 0850 09/27/23 1152 09/28/23 0356  AST 22 27 27   ALT 17 28 24   ALKPHOS 93 97 95  BILITOT 0.6 0.4 0.5  PROT 6.8 6.9 6.4*  ALBUMIN 4.5 4.1 3.7   No results for input(s): LIPASE, AMYLASE in the last 8760 hours. No results for input(s): AMMONIA in the last 8760 hours. CBC: Recent Labs    08/31/23 0852 09/15/23 0548 09/27/23 1152 09/28/23 0356  WBC 5.1 7.8 7.0 6.1  NEUTROABS 2.7  --   --   --   HGB 13.8 11.8* 13.0 12.3  HCT 42.9 36.4 40.6 38.4  MCV 89.0 90.8 90.4 89.5  PLT 308 237 366 318   Lipid Panel: Recent Labs    05/19/23 0735  CHOL 241*  HDL 66  LDLCALC 148*  TRIG 142  CHOLHDL 3.7   No results found for: HGBA1C  Procedures since last visit: US  Carotid Bilateral Result Date: 09/28/2023 CLINICAL DATA:  Near syncope EXAM: BILATERAL CAROTID DUPLEX ULTRASOUND TECHNIQUE: Elnor scale imaging, color Doppler and duplex ultrasound were performed of bilateral carotid and vertebral arteries in the neck. COMPARISON:  CT angiogram of the neck performed July 05, 2023 FINDINGS: Criteria: Quantification of carotid stenosis is based on velocity parameters that correlate the residual internal carotid diameter with NASCET-based stenosis levels, using the diameter of the distal internal carotid lumen as the denominator for stenosis measurement. The following velocity measurements were obtained: RIGHT ICA: 87  cm/sec CCA: 91 cm/sec SYSTOLIC ICA/CCA RATIO:  1.0 ECA: 144 cm/sec LEFT ICA: 115 cm/sec CCA: 64 cm/sec SYSTOLIC ICA/CCA RATIO:  1.8 ECA: 57 cm/sec RIGHT CAROTID ARTERY: Mild atherosclerotic changes, patent. RIGHT VERTEBRAL ARTERY:  Antegrade. LEFT CAROTID ARTERY: No significant stenosis is appreciated, antegrade flow. LEFT VERTEBRAL ARTERY:  Antegrade. IMPRESSION: 1. Right carotid system: Mild atherosclerotic changes in the proximal right internal carotid artery is estimated stenosis measuring less than 50%. 2. Left carotid system: No evidence of significant atherosclerotic change or flow-limiting stenosis. Within normal limits. Electronically Signed   By: Maude Naegeli M.D.   On: 09/28/2023 09:12   CT Head Wo Contrast Result Date: 09/27/2023 CLINICAL DATA:  Headache, new onset (Age >= 51y) near syncope around 0900 this morning. Pt's husband states they were sitting at a table this morning and she became dizzy, barely responsive, and diaphoretic for roughly 30 minutes before coming back to baseline EXAM: CT HEAD WITHOUT CONTRAST TECHNIQUE: Contiguous axial images were obtained from the base of the skull through the vertex without intravenous contrast. RADIATION DOSE REDUCTION: This exam was performed according to the departmental dose-optimization program which includes automated exposure control, adjustment of the mA and/or kV according to patient size and/or use of iterative reconstruction technique. COMPARISON:  MRI head 09/23/2023 FINDINGS: Brain: Patchy and confluent areas of decreased attenuation are noted throughout the deep and periventricular white matter of the cerebral hemispheres bilaterally, compatible with chronic microvascular ischemic disease. No evidence of large-territorial acute infarction. No parenchymal hemorrhage. No mass lesion. No extra-axial collection. No mass effect or midline shift. No hydrocephalus. Basilar cisterns are patent. Vascular: No hyperdense vessel. Skull: No acute fracture  or focal lesion. Sinuses/Orbits: Paranasal sinuses and mastoid air cells are clear. The orbits are unremarkable. Other: None. IMPRESSION: No acute intracranial abnormality. Electronically Signed   By: Morgane  Naveau M.D.  On: 09/27/2023 17:03   DG Chest Portable 1 View Result Date: 09/27/2023 CLINICAL DATA:  Chest pain EXAM: PORTABLE CHEST 1 VIEW COMPARISON:  None Available. FINDINGS: The heart size and mediastinal contours are within normal limits. Both lungs are clear. The visualized skeletal structures are unremarkable. IMPRESSION: No active disease. Electronically Signed   By: Greig Pique M.D.   On: 09/27/2023 15:34   MR BRAIN WO CONTRAST Result Date: 09/23/2023 CLINICAL DATA:  Memory loss. EXAM: MRI HEAD WITHOUT CONTRAST TECHNIQUE: Multiplanar, multiecho pulse sequences of the brain and surrounding structures were obtained without intravenous contrast. Additionally, using NeuroQuant software a 3D volumetric analysis of the brain was performed and is compared to a normative database adjusted for age, gender and intracranial volume. COMPARISON:  CT angiogram of the head dated July 05, 2023. FINDINGS: Brain: Age commensurate cerebral and cerebellar volume loss. No evidence of restricted diffusion to indicate acute or recent infarction. No evidence of hemorrhage, mass or hydrocephalus. Mild to moderate periventricular, subcortical and deep cerebral white matter disease. Developmental venous anomaly within the pons. Vascular: Normal vascular flow voids. Skull and upper cervical spine: Normal marrow signal. No osseous lesions. Sinuses/Orbits: Clear paranasal sinuses.  Normal orbits. Other: None. NeuroQuant Findings: Volumetric analysis of the brain was performed, with a fully detailed report in [facility]. Briefly, the comparison with age and gender matched reference reveals within normal limits. IMPRESSION: 1. Age-related cerebral and cerebellar volume loss and mild to moderate cerebral white matter  disease. 2. NeuroQuant volumetric analysis of the brain, see details on [employer]. Electronically Signed   By: Evalene Coho M.D.   On: 09/23/2023 19:48   Results   RADIOLOGY Head CT: No acute abnormalities Carotid Ultrasound: Normal flow on left and right with less than 50% stenosis  DIAGNOSTIC Echocardiogram: Normal       Assessment/Plan     Recent syncope with exertional dyspnea, fatigue, and orthostatic dizziness Experienced a syncopal episode two weeks post left carotid endarterectomy. Reports fatigue, exertional dyspnea, and orthostatic dizziness. No further syncopal episodes, but experiences dizziness upon standing quickly. Emergency department evaluation, including EKG, carotid ultrasound, and CT head, showed no acute abnormalities. Echocardiogram was normal. Differential diagnosis includes potential atrial fibrillation or cardiac ischemia. Denies chest pain or palpitations. Blood pressure is generally low, and she is well-hydrated. Discussed possibility of cardiac ischemia due to potential coronary artery stenosis, given history of carotid stenosis. - Order heart monitor placement for long-term monitoring - Order stress test to evaluate for cardiac ischemia  Carotid stenosis, status post left carotid endarterectomy Underwent left carotid endarterectomy approximately one month ago. Post-surgery, appears more alert and has a better affect. Concern for similar stenosis in coronary arteries, which may contribute to symptoms. - Discuss potential need for coronary evaluation with cardiologist      I spent greater than 30 minutes for the care of this patient in face to face time, chart review, clinical documentation, patient education.      Labs/tests ordered:  * No order type specified * Next appt:  12/09/2023

## 2023-10-21 NOTE — Addendum Note (Signed)
 Addended by: Ahamed Hofland on: 10/21/2023 06:40 PM   Modules accepted: Orders

## 2023-10-25 DIAGNOSIS — R55 Syncope and collapse: Secondary | ICD-10-CM | POA: Diagnosis not present

## 2023-10-25 DIAGNOSIS — R2681 Unsteadiness on feet: Secondary | ICD-10-CM | POA: Diagnosis not present

## 2023-10-25 DIAGNOSIS — R2689 Other abnormalities of gait and mobility: Secondary | ICD-10-CM | POA: Diagnosis not present

## 2023-10-25 DIAGNOSIS — I6522 Occlusion and stenosis of left carotid artery: Secondary | ICD-10-CM | POA: Diagnosis not present

## 2023-10-27 DIAGNOSIS — R2681 Unsteadiness on feet: Secondary | ICD-10-CM | POA: Diagnosis not present

## 2023-10-27 DIAGNOSIS — R55 Syncope and collapse: Secondary | ICD-10-CM | POA: Diagnosis not present

## 2023-10-27 DIAGNOSIS — I6522 Occlusion and stenosis of left carotid artery: Secondary | ICD-10-CM | POA: Diagnosis not present

## 2023-10-27 DIAGNOSIS — R2689 Other abnormalities of gait and mobility: Secondary | ICD-10-CM | POA: Diagnosis not present

## 2023-11-01 DIAGNOSIS — R55 Syncope and collapse: Secondary | ICD-10-CM | POA: Diagnosis not present

## 2023-11-01 DIAGNOSIS — I6522 Occlusion and stenosis of left carotid artery: Secondary | ICD-10-CM | POA: Diagnosis not present

## 2023-11-01 DIAGNOSIS — R2681 Unsteadiness on feet: Secondary | ICD-10-CM | POA: Diagnosis not present

## 2023-11-01 DIAGNOSIS — R2689 Other abnormalities of gait and mobility: Secondary | ICD-10-CM | POA: Diagnosis not present

## 2023-11-11 ENCOUNTER — Encounter (HOSPITAL_COMMUNITY): Payer: Self-pay | Admitting: *Deleted

## 2023-11-15 DIAGNOSIS — R2681 Unsteadiness on feet: Secondary | ICD-10-CM | POA: Diagnosis not present

## 2023-11-15 DIAGNOSIS — R2689 Other abnormalities of gait and mobility: Secondary | ICD-10-CM | POA: Diagnosis not present

## 2023-11-15 DIAGNOSIS — I6522 Occlusion and stenosis of left carotid artery: Secondary | ICD-10-CM | POA: Diagnosis not present

## 2023-11-15 DIAGNOSIS — R55 Syncope and collapse: Secondary | ICD-10-CM | POA: Diagnosis not present

## 2023-11-17 DIAGNOSIS — R413 Other amnesia: Secondary | ICD-10-CM | POA: Diagnosis not present

## 2023-11-17 DIAGNOSIS — I6522 Occlusion and stenosis of left carotid artery: Secondary | ICD-10-CM | POA: Diagnosis not present

## 2023-11-17 DIAGNOSIS — I951 Orthostatic hypotension: Secondary | ICD-10-CM | POA: Diagnosis not present

## 2023-11-17 DIAGNOSIS — R55 Syncope and collapse: Secondary | ICD-10-CM | POA: Diagnosis not present

## 2023-11-17 DIAGNOSIS — R2681 Unsteadiness on feet: Secondary | ICD-10-CM | POA: Diagnosis not present

## 2023-11-17 DIAGNOSIS — R2689 Other abnormalities of gait and mobility: Secondary | ICD-10-CM | POA: Diagnosis not present

## 2023-11-17 DIAGNOSIS — I6523 Occlusion and stenosis of bilateral carotid arteries: Secondary | ICD-10-CM | POA: Diagnosis not present

## 2023-11-21 ENCOUNTER — Telehealth (HOSPITAL_COMMUNITY): Payer: Self-pay

## 2023-11-21 NOTE — Telephone Encounter (Signed)
 Pt's husband called to make sure she was able to go to the dentist the am of her test. S.Virgilia Quigg CCT

## 2023-11-22 DIAGNOSIS — I6522 Occlusion and stenosis of left carotid artery: Secondary | ICD-10-CM | POA: Diagnosis not present

## 2023-11-22 DIAGNOSIS — R2689 Other abnormalities of gait and mobility: Secondary | ICD-10-CM | POA: Diagnosis not present

## 2023-11-22 DIAGNOSIS — R55 Syncope and collapse: Secondary | ICD-10-CM | POA: Diagnosis not present

## 2023-11-22 DIAGNOSIS — R2681 Unsteadiness on feet: Secondary | ICD-10-CM | POA: Diagnosis not present

## 2023-11-24 ENCOUNTER — Ambulatory Visit (HOSPITAL_COMMUNITY)
Admission: RE | Admit: 2023-11-24 | Discharge: 2023-11-24 | Disposition: A | Discharge status: Home / Self Care | Source: Ambulatory Visit | Attending: Cardiology

## 2023-11-24 ENCOUNTER — Ambulatory Visit (HOSPITAL_COMMUNITY)
Admission: RE | Admit: 2023-11-24 | Discharge: 2023-11-24 | Disposition: A | Discharge status: Home / Self Care | Source: Ambulatory Visit | Attending: Cardiology | Admitting: Cardiology

## 2023-11-24 DIAGNOSIS — R55 Syncope and collapse: Secondary | ICD-10-CM | POA: Diagnosis not present

## 2023-11-24 DIAGNOSIS — R0609 Other forms of dyspnea: Secondary | ICD-10-CM | POA: Insufficient documentation

## 2023-11-24 MED ORDER — PERFLUTREN LIPID MICROSPHERE
5.0000 mL | INTRAVENOUS | Status: AC | PRN
  Administered 2023-11-24: 5 mL via INTRAVENOUS

## 2023-11-25 DIAGNOSIS — R55 Syncope and collapse: Secondary | ICD-10-CM | POA: Diagnosis not present

## 2023-11-25 DIAGNOSIS — R2689 Other abnormalities of gait and mobility: Secondary | ICD-10-CM | POA: Diagnosis not present

## 2023-11-25 DIAGNOSIS — I6522 Occlusion and stenosis of left carotid artery: Secondary | ICD-10-CM | POA: Diagnosis not present

## 2023-11-25 DIAGNOSIS — R2681 Unsteadiness on feet: Secondary | ICD-10-CM | POA: Diagnosis not present

## 2023-11-25 MED FILL — Perflutren Lipid Microsphere IV Susp 1.1 MG/ML: INTRAVENOUS | Qty: 5 | Status: AC

## 2023-11-29 ENCOUNTER — Ambulatory Visit: Payer: Self-pay | Admitting: Student

## 2023-11-29 DIAGNOSIS — R931 Abnormal findings on diagnostic imaging of heart and coronary circulation: Secondary | ICD-10-CM

## 2023-11-29 DIAGNOSIS — R55 Syncope and collapse: Secondary | ICD-10-CM | POA: Diagnosis not present

## 2023-11-29 DIAGNOSIS — I6522 Occlusion and stenosis of left carotid artery: Secondary | ICD-10-CM | POA: Diagnosis not present

## 2023-11-29 DIAGNOSIS — R2689 Other abnormalities of gait and mobility: Secondary | ICD-10-CM | POA: Diagnosis not present

## 2023-11-29 DIAGNOSIS — R2681 Unsteadiness on feet: Secondary | ICD-10-CM | POA: Diagnosis not present

## 2023-11-30 ENCOUNTER — Encounter: Payer: Self-pay | Admitting: Student

## 2023-12-01 ENCOUNTER — Ambulatory Visit: Attending: Cardiovascular Disease | Admitting: Cardiovascular Disease

## 2023-12-01 ENCOUNTER — Encounter: Payer: Self-pay | Admitting: Cardiovascular Disease

## 2023-12-01 VITALS — BP 140/80 | HR 67 | Ht 63.0 in | Wt 137.6 lb

## 2023-12-01 DIAGNOSIS — E782 Mixed hyperlipidemia: Secondary | ICD-10-CM | POA: Diagnosis not present

## 2023-12-01 DIAGNOSIS — R2689 Other abnormalities of gait and mobility: Secondary | ICD-10-CM | POA: Diagnosis not present

## 2023-12-01 DIAGNOSIS — I6522 Occlusion and stenosis of left carotid artery: Secondary | ICD-10-CM | POA: Insufficient documentation

## 2023-12-01 DIAGNOSIS — I951 Orthostatic hypotension: Secondary | ICD-10-CM | POA: Insufficient documentation

## 2023-12-01 DIAGNOSIS — R55 Syncope and collapse: Secondary | ICD-10-CM | POA: Diagnosis not present

## 2023-12-01 DIAGNOSIS — F39 Unspecified mood [affective] disorder: Secondary | ICD-10-CM | POA: Insufficient documentation

## 2023-12-01 DIAGNOSIS — R9439 Abnormal result of other cardiovascular function study: Secondary | ICD-10-CM | POA: Diagnosis not present

## 2023-12-01 DIAGNOSIS — R2681 Unsteadiness on feet: Secondary | ICD-10-CM | POA: Diagnosis not present

## 2023-12-01 DIAGNOSIS — R0609 Other forms of dyspnea: Secondary | ICD-10-CM | POA: Diagnosis not present

## 2023-12-01 DIAGNOSIS — Z79899 Other long term (current) drug therapy: Secondary | ICD-10-CM | POA: Insufficient documentation

## 2023-12-01 NOTE — Patient Instructions (Signed)
 Medication Instructions:  No changes  If you need a refill on your cardiac medications before your next appointment, please call your pharmacy.   Lab work: Your provider would like for you to have following labs drawn today CBC and BMP.    Testing/Procedures:  [facility] [facility] DEPT OF [facility] [place] [address] [place] [address] Dept: [phone] Loc: [phone]  MENDI CONSTABLE  12/01/2023  You are scheduled for a Cardiac Catheterization on Tuesday, September 23 with Dr. Lonni End.  1. Please arrive at the [facility] Entrance of [facility], [address], [place] [address] at 10:00 AM (This is 1 hour(s) prior to your procedure time).  Proceed to the Check-In Desk directly inside the entrance.  Procedure Parking: Use the entrance off of the [address] side of the hospital. Turn right upon entering and follow the driveway to parking that is directly in front of the [facility]. There is no valet parking available at this entrance, however there is an awning directly in front of the [facility] Center for drop off/ pick up for patients.  Special note: Every effort is made to have your procedure done on time. Please understand that emergencies sometimes delay scheduled procedures.  2. Diet: Nothing to eat after midnight.   3. Hydration: You need to be well hydrated before your procedure. On September 23, you may drink approved liquids (see below) until 2 hours before the procedure, with 16 oz of water  as your last intake.   List of approved liquids water , clear juice, clear tea, black coffee, fruit juices, non-citric and without pulp, carbonated beverages, Gatorade, Kool -Aid, plain Jello-O and plain ice popsicles.  5. Medication instructions in preparation for your procedure:   Contrast Allergy: No  On the morning of your procedure, take your Aspirin   81 mg and any morning medicines NOT listed above.  You may use sips of water .  6. Plan to go home the same day, you will only stay overnight if medically necessary. 7. Bring a current list of your medications and current insurance cards. 8. You MUST have a responsible person to drive you home. 9. Someone MUST be with you the first 24 hours after you arrive home or your discharge will be delayed. 10. Please wear clothes that are easy to get on and off and wear slip-on shoes.  Thank you for allowing us  to care for you!   -- [facility]   Follow-Up: At [facility], you and your health needs are our priority.  As part of our continuing mission to provide you with exceptional heart care, we have created designated Provider Care Teams.  These Care Teams include your primary Cardiologist (physician) and Advanced Practice Providers (APPs -  Physician Assistants and Nurse Practitioners) who all work together to provide you with the care you need, when you need it.  You will need a follow up appointment in 1 month  Providers on your designated Care Team:   Lonni Meager, NP Bernardino Bring, PA-C Cadence [place], [place]  COVID-19 Vaccine Information can be found at: [email or web address] For questions related to vaccine distribution or appointments, please email [email or web address] or call [phone].

## 2023-12-01 NOTE — H&P (View-Only) (Signed)
 Cardiology Office Note  Date:  12/01/2023   ID:  Julia Snyder, DOB 12/18/1945, MRN [id number]  PCP:  Abdul Fine, MD   Chief Complaint  Patient presents with   Abnormal Test    Patient states that she feels ok on today. Patient has had a lot of fatigue and a lot of fainting spell. Med reviewed.     HPI:  Julia Snyder a 78 y.o. female seen with past medical history of: PAD, carotid stenosis Hyperlipidemia Orthostatic hypotension Who presents by referral from Dr. Abdul for positive stress test  Recently seen by cardiology November 17, 2023 at [place] On that visit reported fatigue, episodes of near syncope  vascular surgery 09/15/23  for carotid artery stenosis, with a 90% blockage on the left side that was addressed. The right side has a 60% blockage  two weeks post-surgery, sitting at the table, does not remember what happened, cold sweats, 10 -15 min 90% passed out per husband After 15 to 20 min, went to the bed, 30 min, felt better Saw the nurse at [facility], told to go to the ER Went to the ER,  ER visit where she was kept overnight, but no significant findings were noted. About three weeks ago, during physical therapy, she felt extremely weak and 'could barely talk,' prompting EMTs to be called. She did not lose consciousness but felt very weak. She reports persistent fatigue, stating 'I'm extremely tired. I've never felt this tired.'   2 weeks ago with PT, on a arm machine, got dizzy, diaphoresis, EMTs called, recovered Declined ER  Zio monitor showing normal sinus rhythm, rare short episodes SVT longest 11 seconds No atrial fibrillation  Stress test November 24, 2023 showing  1. Hypokinesis of the apical septum and apex at peak stress. Concerning for LAD ischemia. Average functional capacity (4:15 min; 5.8 METS).  Normal HR/BP response to exercise.   2. This is a positive stress echocardiogram for ischemia (see scoringdiagram/findings for description), in  the distribution of the left  anterior descending artery.   EKG personally reviewed by myself on todays visit EKG Interpretation Date/Time:  Thursday December 01 2023 15:26:07 EDT Ventricular Rate:  67 PR Interval:  156 QRS Duration:  76 QT Interval:  386 QTC Calculation: 407 R Axis:   16  Text Interpretation: Normal sinus rhythm Anterior infarct (cited on or before 27-Sep-2023) When compared with ECG of 27-Sep-2023 11:53, No significant change was found Confirmed by Perla Lye [phone]) on 12/01/2023 3:30:57 PM    PMH:   has a past medical history of Arthritis, Bilateral carotid artery stenosis, Closed fracture of lower end of humerus (11/10/2011), Depression, Dyslipidemia (02/25/2017), Lumbar spondylosis (06/20/2023), 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  PSH:    Past Surgical History:  Procedure Laterality Date   BREAST BIOPSY Left 2004   benign   BUNIONECTOMY Bilateral    left ~2014, right 2018   COLONOSCOPY     ELBOW FRACTURE SURGERY Bilateral    right 2019, left ~2015   ENDARTERECTOMY Left 09/14/2023   Procedure: ENDARTERECTOMY, CAROTID;  Surgeon: Jama Cordella MATSU, MD;  Location: [facility] ORS;  Service: Vascular;  Laterality: Left;   FRACTURE SURGERY     KNEE ARTHROPLASTY Left 05/14/2022   Procedure: COMPUTER ASSISTED TOTAL KNEE ARTHROPLASTY - RNFA;  Surgeon: Mardee Lynwood SQUIBB, MD;  Location: [facility] ORS;  Service: Orthopedics;  Laterality: Left;   ORIF ELBOW FRACTURE Right 12/01/2017  Procedure: OPEN REDUCTION INTERNAL FIXATION (ORIF) ELBOW/OLECRANON FRACTURE;  Surgeon: Kathlynn Sharper, MD;  Location: [facility] ORS;  Service: Orthopedics;  Laterality: Right;   OTHER SURGICAL HISTORY  2015   left arm surgery with titanium plate    Current Outpatient Medications  Medication Sig Dispense Refill   alendronate (FOSAMAX) 70 MG tablet Take 70 mg by mouth  once a week.     Ascorbic Acid (VITAMIN C) 1000 MG tablet Take 1,000 mg by mouth daily.     aspirin  EC 81 MG tablet Take 1 tablet (81 mg total) by mouth daily at 6 (six) AM. Swallow whole. 30 tablet 12   atorvastatin  (LIPITOR) 40 MG tablet Take 1 tablet (40 mg total) by mouth daily.     Calcium  Carbonate-Vit D-Min (CALCIUM  1200 PO) Take 1,200 mg by mouth daily.     Cholecalciferol 125 MCG (5000 UT) TABS Take 5,000 Units by mouth daily.     Cyanocobalamin  (B-12 PO) Take 2,000 mcg by mouth daily.     Multiple Vitamins-Minerals (MULTIPLE VITAMINS/WOMENS PO) Take 1 tablet by mouth daily.     Omega-3 Fatty Acids (FISH OIL PO) Take 2,500 mg by mouth daily.     oxyCODONE  (OXY IR/ROXICODONE ) 5 MG immediate release tablet Take 1-2 tablets (5-10 mg total) by mouth every 4 (four) hours as needed for moderate pain (pain score 4-6). 30 tablet 0   vitamin k 100 MCG tablet Take 100 mcg by mouth daily.     No current facility-administered medications for this visit.     Allergies:   Donepezil   Social History:  The patient  reports that she has quit smoking. Her smoking use included cigarettes. She has been exposed to tobacco smoke. She has never used smokeless tobacco. She reports current alcohol use. She reports that she does not use drugs.   Family History:   family history includes Heart disease in her mother; Parkinson's disease in her father; Stroke in her mother.    Review of Systems: Review of Systems  Constitutional:  Positive for malaise/fatigue.  HENT: Negative.    Respiratory: Negative.    Cardiovascular: Negative.   Gastrointestinal: Negative.   Musculoskeletal: Negative.   Neurological:  Positive for dizziness.  Psychiatric/Behavioral: Negative.    All other systems reviewed and are negative.   PHYSICAL EXAM: VS:  BP (!) 140/80   Pulse 67   Ht 5' 3 (1.6 m)   Wt 137 lb 9.6 oz (62.4 kg)   SpO2 96%   BMI 24.37 kg/m  , BMI Body mass index is 24.37 kg/m. GEN: Well nourished,  well developed, in no acute distress HEENT: normal Neck: no JVD, carotid bruits, or masses Cardiac: RRR; no murmurs, rubs, or gallops,no edema  Respiratory:  clear to auscultation bilaterally, normal work of breathing GI: soft, nontender, nondistended, + BS MS: no deformity or atrophy Skin: warm and dry, no rash Neuro:  Strength and sensation are intact Psych: euthymic mood, full affect   Recent Labs: 09/27/2023: TSH 0.709 09/28/2023: ALT 24; BUN 15; Creatinine, Ser 0.61; Hemoglobin 12.3; Platelets 318; Potassium 3.8; Sodium 138    Lipid Panel Lab Results  Component Value Date   CHOL 241 (H) 05/19/2023   HDL 66 05/19/2023   LDLCALC 148 (H) 05/19/2023   TRIG 142 05/19/2023      Wt Readings from Last 3 Encounters:  12/01/23 137 lb 9.6 oz (62.4 kg)  10/19/23 135 lb 12.8 oz (61.6 kg)  10/07/23 132 lb 2 oz (59.9 kg)  ASSESSMENT AND PLAN:  Problem List Items Addressed This Visit       Cardiology Problems   Carotid stenosis, asymptomatic   Hyperlipidemia   Orthostatic hypotension     Other   Mood disorder (HCC)   Other Visit Diagnoses       Dyspnea on exertion    -  Primary   Relevant Orders   EKG 12-Lead (Completed)     Syncope, unspecified syncope type         Positive cardiac stress test          Positive stress test Stress test ordered by Dr. Abdul at [facility] Exercise/stress echo performed in [place] with concern for apical ischemia - On further discussion, she is concerned about positive stress test finding - We have recommended cardiac catheterization versus noninvasive imaging such as cardiac CT for confirmation - She and her husband have elected to proceed with cardiac catheterization - Tentatively scheduled September 23 with Dr. Mady I have reviewed the risks, indications, and alternatives to cardiac catheterization, possible angioplasty, and stenting with the patient. Risks include but are not limited to bleeding, infection, vascular  injury, stroke, myocardial infection, arrhythmia, kidney injury, radiation-related injury in the case of prolonged fluoroscopy use, emergency cardiac surgery, and death. The patient understands the risks of serious complication is 1-2 in 1000 with diagnostic cardiac cath and 1-2% or less with angioplasty/stenting.  - She is willing to proceed, lab work performed today, instructions provided  Near syncope Unable to exclude vasovagal events Recommend she stay hydrated especially first thing in the morning - Recommend checking orthostatics at home  PAD/carotid stenosis Carotid endarterectomy on the left Recovered well  Hyperlipidemia Most recent cholesterol record 240 with LDL 148 On Lipitor 40 daily since hospital discharge July 2025 Goal LDL less than 70, preferably less than 55    Signed, Velinda Lunger, M.D., Ph.D. [facility] [place], [place] [phone]

## 2023-12-01 NOTE — Progress Notes (Signed)
 Cardiology Office Note  Date:  12/01/2023   ID:  Julia Snyder, DOB 12/18/1945, MRN [id number]  PCP:  Abdul Fine, MD   Chief Complaint  Patient presents with   Abnormal Test    Patient states that she feels ok on today. Patient has had a lot of fatigue and a lot of fainting spell. Med reviewed.     HPI:  Julia Snyder a 78 y.o. female seen with past medical history of: PAD, carotid stenosis Hyperlipidemia Orthostatic hypotension Who presents by referral from Dr. Abdul for positive stress test  Recently seen by cardiology November 17, 2023 at [place] On that visit reported fatigue, episodes of near syncope  vascular surgery 09/15/23  for carotid artery stenosis, with a 90% blockage on the left side that was addressed. The right side has a 60% blockage  two weeks post-surgery, sitting at the table, does not remember what happened, cold sweats, 10 -15 min 90% passed out per husband After 15 to 20 min, went to the bed, 30 min, felt better Saw the nurse at [facility], told to go to the ER Went to the ER,  ER visit where she was kept overnight, but no significant findings were noted. About three weeks ago, during physical therapy, she felt extremely weak and 'could barely talk,' prompting EMTs to be called. She did not lose consciousness but felt very weak. She reports persistent fatigue, stating 'I'm extremely tired. I've never felt this tired.'   2 weeks ago with PT, on a arm machine, got dizzy, diaphoresis, EMTs called, recovered Declined ER  Zio monitor showing normal sinus rhythm, rare short episodes SVT longest 11 seconds No atrial fibrillation  Stress test November 24, 2023 showing  1. Hypokinesis of the apical septum and apex at peak stress. Concerning for LAD ischemia. Average functional capacity (4:15 min; 5.8 METS).  Normal HR/BP response to exercise.   2. This is a positive stress echocardiogram for ischemia (see scoringdiagram/findings for description), in  the distribution of the left  anterior descending artery.   EKG personally reviewed by myself on todays visit EKG Interpretation Date/Time:  Thursday December 01 2023 15:26:07 EDT Ventricular Rate:  67 PR Interval:  156 QRS Duration:  76 QT Interval:  386 QTC Calculation: 407 R Axis:   16  Text Interpretation: Normal sinus rhythm Anterior infarct (cited on or before 27-Sep-2023) When compared with ECG of 27-Sep-2023 11:53, No significant change was found Confirmed by Julia Snyder [phone]) on 12/01/2023 3:30:57 PM    PMH:   has a past medical history of Arthritis, Bilateral carotid artery stenosis, Closed fracture of lower end of humerus (11/10/2011), Depression, Dyslipidemia (02/25/2017), Lumbar spondylosis (06/20/2023), 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  PSH:    Past Surgical History:  Procedure Laterality Date   BREAST BIOPSY Left 2004   benign   BUNIONECTOMY Bilateral    left ~2014, right 2018   COLONOSCOPY     ELBOW FRACTURE SURGERY Bilateral    right 2019, left ~2015   ENDARTERECTOMY Left 09/14/2023   Procedure: ENDARTERECTOMY, CAROTID;  Surgeon: Jama Cordella MATSU, MD;  Location: [facility] ORS;  Service: Vascular;  Laterality: Left;   FRACTURE SURGERY     KNEE ARTHROPLASTY Left 05/14/2022   Procedure: COMPUTER ASSISTED TOTAL KNEE ARTHROPLASTY - RNFA;  Surgeon: Mardee Lynwood SQUIBB, MD;  Location: [facility] ORS;  Service: Orthopedics;  Laterality: Left;   ORIF ELBOW FRACTURE Right 12/01/2017  Procedure: OPEN REDUCTION INTERNAL FIXATION (ORIF) ELBOW/OLECRANON FRACTURE;  Surgeon: Kathlynn Sharper, MD;  Location: [facility] ORS;  Service: Orthopedics;  Laterality: Right;   OTHER SURGICAL HISTORY  2015   left arm surgery with titanium plate    Current Outpatient Medications  Medication Sig Dispense Refill   alendronate (FOSAMAX) 70 MG tablet Take 70 mg by mouth  once a week.     Ascorbic Acid (VITAMIN C) 1000 MG tablet Take 1,000 mg by mouth daily.     aspirin  EC 81 MG tablet Take 1 tablet (81 mg total) by mouth daily at 6 (six) AM. Swallow whole. 30 tablet 12   atorvastatin  (LIPITOR) 40 MG tablet Take 1 tablet (40 mg total) by mouth daily.     Calcium  Carbonate-Vit D-Min (CALCIUM  1200 PO) Take 1,200 mg by mouth daily.     Cholecalciferol 125 MCG (5000 UT) TABS Take 5,000 Units by mouth daily.     Cyanocobalamin  (B-12 PO) Take 2,000 mcg by mouth daily.     Multiple Vitamins-Minerals (MULTIPLE VITAMINS/WOMENS PO) Take 1 tablet by mouth daily.     Omega-3 Fatty Acids (FISH OIL PO) Take 2,500 mg by mouth daily.     oxyCODONE  (OXY IR/ROXICODONE ) 5 MG immediate release tablet Take 1-2 tablets (5-10 mg total) by mouth every 4 (four) hours as needed for moderate pain (pain score 4-6). 30 tablet 0   vitamin k 100 MCG tablet Take 100 mcg by mouth daily.     No current facility-administered medications for this visit.     Allergies:   Donepezil   Social History:  The patient  reports that she has quit smoking. Her smoking use included cigarettes. She has been exposed to tobacco smoke. She has never used smokeless tobacco. She reports current alcohol use. She reports that she does not use drugs.   Family History:   family history includes Heart disease in her mother; Parkinson's disease in her father; Stroke in her mother.    Review of Systems: Review of Systems  Constitutional:  Positive for malaise/fatigue.  HENT: Negative.    Respiratory: Negative.    Cardiovascular: Negative.   Gastrointestinal: Negative.   Musculoskeletal: Negative.   Neurological:  Positive for dizziness.  Psychiatric/Behavioral: Negative.    All other systems reviewed and are negative.   PHYSICAL EXAM: VS:  BP (!) 140/80   Pulse 67   Ht 5' 3 (1.6 m)   Wt 137 lb 9.6 oz (62.4 kg)   SpO2 96%   BMI 24.37 kg/m  , BMI Body mass index is 24.37 kg/m. GEN: Well nourished,  well developed, in no acute distress HEENT: normal Neck: no JVD, carotid bruits, or masses Cardiac: RRR; no murmurs, rubs, or gallops,no edema  Respiratory:  clear to auscultation bilaterally, normal work of breathing GI: soft, nontender, nondistended, + BS MS: no deformity or atrophy Skin: warm and dry, no rash Neuro:  Strength and sensation are intact Psych: euthymic mood, full affect   Recent Labs: 09/27/2023: TSH 0.709 09/28/2023: ALT 24; BUN 15; Creatinine, Ser 0.61; Hemoglobin 12.3; Platelets 318; Potassium 3.8; Sodium 138    Lipid Panel Lab Results  Component Value Date   CHOL 241 (H) 05/19/2023   HDL 66 05/19/2023   LDLCALC 148 (H) 05/19/2023   TRIG 142 05/19/2023      Wt Readings from Last 3 Encounters:  12/01/23 137 lb 9.6 oz (62.4 kg)  10/19/23 135 lb 12.8 oz (61.6 kg)  10/07/23 132 lb 2 oz (59.9 kg)  ASSESSMENT AND PLAN:  Problem List Items Addressed This Visit       Cardiology Problems   Carotid stenosis, asymptomatic   Hyperlipidemia   Orthostatic hypotension     Other   Mood disorder (HCC)   Other Visit Diagnoses       Dyspnea on exertion    -  Primary   Relevant Orders   EKG 12-Lead (Completed)     Syncope, unspecified syncope type         Positive cardiac stress test          Positive stress test Stress test ordered by Dr. Abdul at [facility] Exercise/stress echo performed in [place] with concern for apical ischemia - On further discussion, she is concerned about positive stress test finding - We have recommended cardiac catheterization versus noninvasive imaging such as cardiac CT for confirmation - She and her husband have elected to proceed with cardiac catheterization - Tentatively scheduled September 23 with Dr. Mady I have reviewed the risks, indications, and alternatives to cardiac catheterization, possible angioplasty, and stenting with the patient. Risks include but are not limited to bleeding, infection, vascular  injury, stroke, myocardial infection, arrhythmia, kidney injury, radiation-related injury in the case of prolonged fluoroscopy use, emergency cardiac surgery, and death. The patient understands the risks of serious complication is 1-2 in 1000 with diagnostic cardiac cath and 1-2% or less with angioplasty/stenting.  - She is willing to proceed, lab work performed today, instructions provided  Near syncope Unable to exclude vasovagal events Recommend she stay hydrated especially first thing in the morning - Recommend checking orthostatics at home  PAD/carotid stenosis Carotid endarterectomy on the left Recovered well  Hyperlipidemia Most recent cholesterol record 240 with LDL 148 On Lipitor 40 daily since hospital discharge July 2025 Goal LDL less than 70, preferably less than 55    Signed, Velinda Lunger, M.D., Ph.D. [facility] [place], [place] [phone]

## 2023-12-02 ENCOUNTER — Telehealth: Payer: Self-pay | Admitting: *Deleted

## 2023-12-02 ENCOUNTER — Other Ambulatory Visit: Payer: Self-pay | Admitting: Cardiovascular Disease

## 2023-12-02 DIAGNOSIS — R55 Syncope and collapse: Secondary | ICD-10-CM

## 2023-12-02 DIAGNOSIS — R0609 Other forms of dyspnea: Secondary | ICD-10-CM | POA: Diagnosis not present

## 2023-12-02 DIAGNOSIS — R9439 Abnormal result of other cardiovascular function study: Secondary | ICD-10-CM

## 2023-12-02 DIAGNOSIS — I200 Unstable angina: Secondary | ICD-10-CM

## 2023-12-02 LAB — CBC
Hematocrit: 42.3 % (ref 34.0–46.6)
Hemoglobin: 13.6 g/dL (ref 11.1–15.9)
MCH: 29 pg (ref 26.6–33.0)
MCHC: 32.2 g/dL (ref 31.5–35.7)
MCV: 90 fL (ref 79–97)
Platelets: 319 x10E3/uL (ref 150–450)
RBC: 4.69 x10E6/uL (ref 3.77–5.28)
RDW: 13 % (ref 11.7–15.4)
WBC: 7.5 x10E3/uL (ref 3.4–10.8)

## 2023-12-02 LAB — BASIC METABOLIC PANEL WITH GFR
BUN/Creatinine Ratio: 22 (ref 12–28)
BUN: 16 mg/dL (ref 8–27)
CO2: 23 mmol/L (ref 20–29)
Calcium: 10.2 mg/dL (ref 8.7–10.3)
Chloride: 98 mmol/L (ref 96–106)
Creatinine, Ser: 0.73 mg/dL (ref 0.57–1.00)
Glucose: 92 mg/dL (ref 70–99)
Potassium: 4.5 mmol/L (ref 3.5–5.2)
Sodium: 137 mmol/L (ref 134–144)
eGFR: 84 mL/min/1.73 (ref >59)

## 2023-12-02 NOTE — Telephone Encounter (Signed)
 Call placed to the patient to inform her that she will need to be at the hospital by 9:30 on 9/23 for the cardiac cath. All instructions have been advised upon.

## 2023-12-03 ENCOUNTER — Ambulatory Visit: Payer: Self-pay | Admitting: Cardiovascular Disease

## 2023-12-06 ENCOUNTER — Encounter: Payer: Self-pay | Admitting: Internal Medicine

## 2023-12-06 ENCOUNTER — Ambulatory Visit
Admission: RE | Admit: 2023-12-06 | Discharge: 2023-12-06 | Disposition: A | Discharge status: Home / Self Care | Attending: Internal Medicine | Admitting: Internal Medicine

## 2023-12-06 ENCOUNTER — Other Ambulatory Visit: Payer: Self-pay

## 2023-12-06 ENCOUNTER — Encounter
Admission: RE | Disposition: A | Discharge status: Home / Self Care | Payer: Self-pay | Source: Home / Self Care | Attending: Internal Medicine

## 2023-12-06 DIAGNOSIS — F39 Unspecified mood [affective] disorder: Secondary | ICD-10-CM | POA: Diagnosis not present

## 2023-12-06 DIAGNOSIS — E785 Hyperlipidemia, unspecified: Secondary | ICD-10-CM | POA: Diagnosis not present

## 2023-12-06 DIAGNOSIS — Z79899 Other long term (current) drug therapy: Secondary | ICD-10-CM | POA: Diagnosis not present

## 2023-12-06 DIAGNOSIS — R9439 Abnormal result of other cardiovascular function study: Secondary | ICD-10-CM | POA: Diagnosis present

## 2023-12-06 DIAGNOSIS — I739 Peripheral vascular disease, unspecified: Secondary | ICD-10-CM | POA: Diagnosis not present

## 2023-12-06 DIAGNOSIS — R0609 Other forms of dyspnea: Secondary | ICD-10-CM | POA: Insufficient documentation

## 2023-12-06 DIAGNOSIS — I200 Unstable angina: Secondary | ICD-10-CM

## 2023-12-06 DIAGNOSIS — I6523 Occlusion and stenosis of bilateral carotid arteries: Secondary | ICD-10-CM | POA: Insufficient documentation

## 2023-12-06 DIAGNOSIS — I2510 Atherosclerotic heart disease of native coronary artery without angina pectoris: Secondary | ICD-10-CM | POA: Insufficient documentation

## 2023-12-06 DIAGNOSIS — Z87891 Personal history of nicotine dependence: Secondary | ICD-10-CM | POA: Diagnosis not present

## 2023-12-06 DIAGNOSIS — R55 Syncope and collapse: Secondary | ICD-10-CM | POA: Insufficient documentation

## 2023-12-06 DIAGNOSIS — R5383 Other fatigue: Secondary | ICD-10-CM

## 2023-12-06 DIAGNOSIS — I951 Orthostatic hypotension: Secondary | ICD-10-CM | POA: Insufficient documentation

## 2023-12-06 HISTORY — PX: LEFT HEART CATH AND CORONARY ANGIOGRAPHY: CATH118249

## 2023-12-06 SURGERY — LEFT HEART CATH AND CORONARY ANGIOGRAPHY
Anesthesia: Moderate Sedation

## 2023-12-06 SURGERY — LEFT HEART CATH AND CORONARY ANGIOGRAPHY
Anesthesia: Moderate Sedation | Laterality: Left

## 2023-12-06 MED ORDER — VERAPAMIL HCL 2.5 MG/ML IV SOLN
INTRAVENOUS | Status: AC
  Filled 2023-12-06: qty 2

## 2023-12-06 MED ORDER — AMLODIPINE BESYLATE 2.5 MG PO TABS: 2.5000 mg | ORAL_TABLET | Freq: Every day | ORAL | 11 refills | Status: DC

## 2023-12-06 MED ORDER — ONDANSETRON HCL 4 MG/2ML IJ SOLN: 4.0000 mg | Freq: Four times a day (QID) | INTRAMUSCULAR | Status: DC | PRN

## 2023-12-06 MED ORDER — LIDOCAINE HCL 1 % IJ SOLN
INTRAMUSCULAR | Status: AC
  Filled 2023-12-06: qty 20

## 2023-12-06 MED ORDER — FREE WATER
500.0000 mL | Freq: Once | Status: AC
  Administered 2023-12-06: 500 mL via ORAL

## 2023-12-06 MED ORDER — FENTANYL CITRATE (PF) 100 MCG/2ML IJ SOLN
INTRAMUSCULAR | Status: AC
  Filled 2023-12-06: qty 2

## 2023-12-06 MED ORDER — FREE WATER: 500.0000 mL | Freq: Once | Status: DC

## 2023-12-06 MED ORDER — LABETALOL HCL 5 MG/ML IV SOLN: 10.0000 mg | INTRAVENOUS | Status: DC | PRN

## 2023-12-06 MED ORDER — SODIUM CHLORIDE 0.9 % IV SOLN: 250.0000 mL | INTRAVENOUS | Status: DC | PRN

## 2023-12-06 MED ORDER — SODIUM CHLORIDE 0.9% FLUSH: 3.0000 mL | INTRAVENOUS | Status: DC | PRN

## 2023-12-06 MED ORDER — HEPARIN (PORCINE) IN NACL 2000-0.9 UNIT/L-% IV SOLN
INTRAVENOUS | Status: DC | PRN
  Administered 2023-12-06: 1000 mL

## 2023-12-06 MED ORDER — HYDRALAZINE HCL 20 MG/ML IJ SOLN: 10.0000 mg | INTRAMUSCULAR | Status: DC | PRN

## 2023-12-06 MED ORDER — ASPIRIN 81 MG PO CHEW: 81.0000 mg | CHEWABLE_TABLET | ORAL | Status: DC

## 2023-12-06 MED ORDER — ACETAMINOPHEN 325 MG PO TABS: 650.0000 mg | ORAL_TABLET | ORAL | Status: DC | PRN

## 2023-12-06 MED ORDER — SODIUM CHLORIDE 0.9 % IV SOLN
250.0000 mL | INTRAVENOUS | Status: DC | PRN
  Administered 2023-12-06: 500 mL via INTRAVENOUS

## 2023-12-06 MED ORDER — HEPARIN (PORCINE) IN NACL 1000-0.9 UT/500ML-% IV SOLN
INTRAVENOUS | Status: AC
  Filled 2023-12-06: qty 1000

## 2023-12-06 MED ORDER — HEPARIN SODIUM (PORCINE) 1000 UNIT/ML IJ SOLN
INTRAMUSCULAR | Status: DC | PRN
  Administered 2023-12-06: 3000 [IU] via INTRAVENOUS

## 2023-12-06 MED ORDER — LIDOCAINE HCL (PF) 1 % IJ SOLN
INTRAMUSCULAR | Status: DC | PRN
  Administered 2023-12-06: 5 mL

## 2023-12-06 MED ORDER — VERAPAMIL HCL 2.5 MG/ML IV SOLN
INTRAVENOUS | Status: DC | PRN
  Administered 2023-12-06 (×2): 2.5 mg via INTRAVENOUS

## 2023-12-06 MED ORDER — IOHEXOL 300 MG/ML  SOLN
INTRAMUSCULAR | Status: DC | PRN
  Administered 2023-12-06: 40 mL

## 2023-12-06 MED ORDER — MIDAZOLAM HCL 2 MG/2ML IJ SOLN
INTRAMUSCULAR | Status: AC
  Filled 2023-12-06: qty 2

## 2023-12-06 MED ORDER — SODIUM CHLORIDE 0.9% FLUSH: 3.0000 mL | Freq: Two times a day (BID) | INTRAVENOUS | Status: DC

## 2023-12-06 MED ORDER — HEPARIN SODIUM (PORCINE) 1000 UNIT/ML IJ SOLN
INTRAMUSCULAR | Status: AC
  Filled 2023-12-06: qty 10

## 2023-12-06 SURGICAL SUPPLY — 10 items
CATH 5F 110X4 TIG (CATHETERS) IMPLANT
CATH INFINITI 5FR ANG PIGTAIL (CATHETERS) IMPLANT
DEVICE RAD TR BAND REGULAR (VASCULAR PRODUCTS) IMPLANT
DRAPE BRACHIAL (DRAPES) IMPLANT
GLIDESHEATH SLEND SS 6F .021 (SHEATH) IMPLANT
GUIDEWIRE INQWIRE 1.5J.035X260 (WIRE) IMPLANT
PACK CARDIAC CATH (CUSTOM PROCEDURE TRAY) ×1 IMPLANT
SET ATX-X65L (MISCELLANEOUS) IMPLANT
STATION PROTECTION PRESSURIZED (MISCELLANEOUS) IMPLANT
TUBING CIL FLEX 10 FLL-RA (TUBING) IMPLANT

## 2023-12-06 NOTE — Interval H&P Note (Signed)
 History and Physical Interval Note:  12/06/2023 10:33 AM  Julia Snyder  has presented today for surgery, with the diagnosis of fatigue and abnormal stress test.  The various methods of treatment have been discussed with the patient and family. After consideration of risks, benefits and other options for treatment, the patient has consented to  Procedure(s): LEFT HEART CATH AND CORONARY ANGIOGRAPHY (N/A) as a surgical intervention.  The patient's history has been reviewed, patient examined, no change in status, stable for surgery.  I have reviewed the patient's chart and labs.  Questions were answered to the patient's satisfaction.    Cath Lab Visit (complete for each Cath Lab visit)  Clinical Evaluation Leading to the Procedure:   ACS: No.  Non-ACS:    Anginal Classification: CCS III (fatigue; no chest pain reported)  Anti-ischemic medical therapy: No Therapy  Non-Invasive Test Results: Intermediate-risk stress test findings: cardiac mortality 1-3%/year  Prior CABG: No previous CABG  Billey Wojciak

## 2023-12-07 ENCOUNTER — Encounter: Payer: Self-pay | Admitting: Internal Medicine

## 2023-12-09 ENCOUNTER — Ambulatory Visit: Admitting: Student

## 2023-12-13 DIAGNOSIS — R2689 Other abnormalities of gait and mobility: Secondary | ICD-10-CM | POA: Diagnosis not present

## 2023-12-13 DIAGNOSIS — F02A0 Dementia in other diseases classified elsewhere, mild, without behavioral disturbance, psychotic disturbance, mood disturbance, and anxiety: Secondary | ICD-10-CM | POA: Diagnosis not present

## 2023-12-13 DIAGNOSIS — I639 Cerebral infarction, unspecified: Secondary | ICD-10-CM | POA: Diagnosis not present

## 2023-12-13 DIAGNOSIS — G309 Alzheimer's disease, unspecified: Secondary | ICD-10-CM | POA: Diagnosis not present

## 2023-12-13 DIAGNOSIS — I6522 Occlusion and stenosis of left carotid artery: Secondary | ICD-10-CM | POA: Diagnosis not present

## 2023-12-13 DIAGNOSIS — R2681 Unsteadiness on feet: Secondary | ICD-10-CM | POA: Diagnosis not present

## 2023-12-13 DIAGNOSIS — R55 Syncope and collapse: Secondary | ICD-10-CM | POA: Diagnosis not present

## 2023-12-13 DIAGNOSIS — R5382 Chronic fatigue, unspecified: Secondary | ICD-10-CM | POA: Diagnosis not present

## 2023-12-13 NOTE — Progress Notes (Signed)
 Ref Provider: Abdul Richerd Lapine,* PCP: Abdul Richerd Lapine, MD Assessment and Plan:   In most patients we give written parts of assessment and plan to patient under Patient Instructions/After Visit Summary. So some parts are directed to patient.  Dear Ms. [facility], It was our pleasure to participate in your care in person. We have typed up brief summary of what we discussed. 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 MRI shows hippocampal atrophy and silent strokes. Blood work indicates elevated P tau 217 and reduced beta amyloid 40 to 40 ratio, consistent with Alzheimer's pathology. Cognitive reserve may contribute to better-than-expected functional capacity.  I reviewed patient's imaging report and actual images, on my read I agree with the radiologist report.  MRI Brain 09/23/2023 -  NeuroQuant Findings: Volumetric analysis of the brain was performed, with a fully detailed report in [facility]. Briefly, the comparison with age and gender matched reference reveals within normal limits.   IMPRESSION: 1. Age-related cerebral and cerebellar volume loss and mild to moderate cerebral white matter disease. 2. NeuroQuant volumetric analysis of the brain, see details on  [employer].   I reviewed labs, imaging, and notes in [place], [place], and from outside providers, if available.   she has an elevation in p-Tau 217 and reduced amyloid beta 42/40 ratio, this can be seen in patients with underlying neurodegenerative disease, suggestive of Alzheimer's Disease pathology but not diagnostic of it. We will discuss more at next visit.    Highly elevated Vitamin D - patient should stop taking Vitamin D.   Slightly elevated ALT and Alkaline Phosphatase - may not be clinically significant. Should talk to  primary care provider at next visit about it.   - Start memantine to potentially slow disease progression.  Memantine 5 mg by mouth two times a day  - Order neuropsychological testing for detailed cognitive evaluation. Islay Polanco should have a neuropsychological evaluation for further defining and quantification of cognitive impairment. Below are the options where patients can have this done.  [email or web address] [address] [place], [place] [address] [phone] office [phone] fax - Refer to speech therapy for cognitive rehabilitation with [facility].  Send a consult to Speech-language pathologist referral  Ronal Landry [place], MS, CCC-SLP Phone: [phone] Website: [email or web address]  1 pecentile hippocampus and 11 percentile whole brain volume  2. 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 White matter changes increase the risk of vascular dementia.  - Discuss the importance of managing vascular risk factors to prevent further ischemic changes.  3. Fatigue and physical exhaustion Persistent physical fatigue, not cognitive. Differential includes hypothyroidism, poor sleep, and adrenal insufficiency. Recent cardiac evaluation showed LAD stenosis, which may contribute to fatigue due to reduced cardiac output. Facial hair growth noted, possibly related to hormonal changes post-menopause.  - Order blood work to evaluate for hypothyroidism, adrenal insufficiency, and other causes of fatigue. - Consider referral to a personal trainer for tailored exercise program to address physical fatigue. Jill Davis is a Board-Certified Lifestyle Medicine Professional who specializes in using therapeutic lifestyle interventions to treat  chronic conditions.  If you would like to get in touch with her or read more about her see below. Phone: [phone] Email:  [email or web address] Website: [email or web address]  4. Coronary artery disease with LAD stenosis Coronary artery disease with 50-60% stenosis in the mid LAD, confirmed by heart catheterization. No significant blockage requiring stenting. Fatigue may be related to reduced cardiac output due to stenosis. Ejection fraction is preserved, ruling out congestive heart failure.  - Continue amlodipine  as prescribed. - Monitor for symptoms of angina or cardiac ischemia, which may necessitate further intervention.  5. Carotid artery stenosis, status post endarterectomy  Carotid artery stenosis, status post endarterectomy, reducing the risk of major stroke.  6. Hyperlipidemia Hyperlipidemia managed with atorvastatin . Important to control lipid levels to reduce the risk of further atherosclerotic disease progression.  - Continue atorvastatin  as prescribed.  CONSIDERED COMORBIDITIES BELOW Mild intracranial atherosclerotic disease  Follow-up in 4-5 months with Dr. Jannett Fairly  Return in about 5 months (around 05/12/2024) for with Jannett MARLA Fairly MD. This note has been created using automated tools and reviewed for accuracy by [facility]. I spent a total of 50 minutes in both face-to-face and non-face-to-face activities, excluding procedures performed, for this visit on the date of this encounter. Interim History date 12/13/2023   Ms. Nesbitt is a 78 y.o. female here for treatment and evaluation of Cognitive-Communication Impairment  Ms. Mabin last visit was on 09/06/2023  Patient states she feels her memory is stable.  Husband agrees with this.    Patient was seen at [facility] on 09/27/2023 for syncopal event.    History of Present Illness Frederica Chrestman is a 78 year old female who presents for follow-up of likely  mild dementia.  Cognitive impairment - Cognitive symptoms present since 2023, including short-term memory problems, forgetfulness, and difficulty following conversations - SLUM score of 17 out of 30 in 2024 - Blood work with elevated P tau 217 and reduced beta amyloid 40 to 40 ratio - MRI brain showing age-related cerebellar volume loss and white matter microvascular ischemic changes - Memory fog issues over the past two years  Syncope and dizziness - Syncopal episode on September 27, 2023, resulting in emergency room visit - Two fainting spells observed by family over the past two years - Dizziness and sweating present over the past two years - Heart monitor placed for 14 days after syncopal episode - Stress test indicated possible LAD blockage; subsequent heart catheterization showed 50-60% lesions in mid LAD, but no significant blockage  Fatigue and physical exhaustion - Significant fatigue described as physical rather than cognitive - Increasing fatigue and tiredness over the past two years - No mental exhaustion, but physical sensation of body not being ready to move - Exercise attempted for fatigue, but feels more could be done  Vascular and metabolic risk factors - History of carotid stenosis, status post carotid endarterectomy - Mild intracranial atherosclerotic disease - Hyperlipidemia - Started on amlodipine  after cardiac evaluation  Hirsutism - Increased facial hair, noticed by family  I reviewed labs, imaging, and notes in [place], [place], and from outside providers, if available.   Results LABS P-tau 217: elevated (2024) Beta-amyloid 40/40 ratio: reduced (2024) ALT: slight elevation (2024) Alkaline phosphatase: slight elevation (2024)  RADIOLOGY Brain MRI: age-related cerebellar volume loss with white matter microvascular ischemic changes, silent strokes in brainstem, hippocampal atrophy, lacunar stroke in left putamen, generalized atherosclerotic disease,  multiple white matter hyperintensities, hippocampus at 1st percentile for volume (2024)  DIAGNOSTIC Heart catheterization: LAD 50-60% stenosis, tortuous LAD, hypertensive heart disease, no significant involvement of other vessels, normal ejection fraction (12/08/2023)  Disease  Summary: (Aggregate of information from previous visits)   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 MoCA scores declined from 24/30 in 2023 to 22/30 in 2024, and SLUMS score is 17/30. No significant head trauma or family history of dementia. Differential diagnosis includes Alzheimer's disease and vascular dementia. Lifestyle interventions such as a plant-based diet, exercise, and social connections were discussed for potential symptom reversal. MRI with neuroquant sequence and blood biomarkers (p-tau 217, beta-amyloid 40/40 ratio) planned for differential diagnosis. Memantine considered post-surgery and workup.  Patient here to be evaluated for memory concerns, started a couple years ago. Patient husband states patient is often repeating questions and conversations. Husband reports patient has no difficulty with facial recognition only names. Misplaces items around the house, per husband. Denies visual and auditory hallucinations. Currently not driving. Lives with husband, patient manages her medications and husband manages finances. Denies difficulty remembering medications. Husband notes patient has no difficulty with devices and tools. Sleep is fine. Mood is everywhere. Memory evaluation today is 24/30. Stopped taking Oxybutynin  secondary to memory loss.    MRI Brain 09/23/2023 -  NeuroQuant Findings: Volumetric analysis of the brain was performed, with a fully detailed report in [facility]. Briefly, the comparison with age and gender matched reference  reveals within normal limits.   IMPRESSION: 1. Age-related cerebral and cerebellar volume loss and mild to moderate cerebral white matter disease. 2. NeuroQuant volumetric analysis of the brain, see details on  [employer].   she has an elevation in p-Tau 217 and reduced amyloid beta 42/40 ratio, this can be seen in patients with underlying neurodegenerative disease, suggestive of Alzheimer's Disease pathology but not diagnostic of it. We will discuss more at next visit.    Highly elevated Vitamin D - patient should stop taking Vitamin D.   Slightly elevated ALT and Alkaline Phosphatase - may not be clinically significant. Should talk to primary care provider at next visit about it.   Medications tried: Donepezil (caused severe forgetfulness)  Carotid artery stenosis 75% stenosis of the proximal left internal carotid artery. Scheduled for carotid endarterectomy on July 2. Discussed potential post-procedure hypervascularization syndrome and associated headaches, expected to improve over 2-4 weeks. Procedure may enhance fatigue by increasing cerebral blood flow.  07/05/2023 CTA Head Neck W/Wo Contrast  IMPRESSION:  1. Approximately 75% stenosis of the proximal left ICA.  2. No significant right-sided carotid stenosis.  3. Mild intracranial atherosclerosis without a significant stenosis.  4. No acute intracranial abnormality. Mild generalized cerebral  atrophy.  5. Aortic Atherosclerosis (ICD10-I70.0).   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 Emphasized lifestyle interventions and statin therapy for atherosclerosis management. Statins may facilitate vascular remodeling and enhance blood flow.  Fatigue and physical exhaustion Chronic fatigue requiring periodic rest, present since 2023, possibly related to stress and upcoming knee replacement surgery. Fatigue workup includes early morning serum cortisol. Potential improvement post-carotid  endarterectomy and exercise benefits discussed.  Persistent physical fatigue, not cognitive. Differential includes hypothyroidism, poor sleep, and adrenal insufficiency. Recent cardiac evaluation showed LAD stenosis, which may contribute to fatigue due to reduced cardiac output. Facial hair growth noted, possibly related to hormonal changes post-menopause.  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 White matter changes increase the risk of vascular  dementia.  Coronary artery disease with LAD stenosis - on amlodipine  Coronary artery disease with 50-60% stenosis in the mid LAD, confirmed by heart catheterization. No significant blockage requiring stenting. Fatigue may be related to reduced cardiac output due to stenosis. Ejection fraction is preserved, ruling out congestive heart failure.  Hyperlipidemia - on atorvastatin   Physical Exam   Vitals Vitals:   12/13/23 1541  BP: 114/68  Weight: 62.6 kg (138 lb)  Height: 160 cm (5' 3)  PainSc: 0-No pain   Body mass index is 24.45 kg/m.  (Some of the exam changes noted are from previous clinical observations)  Physical Exam   General exam  Neuro exam   Below information was reviewed by [facility] .  Past Medical History:  Past Medical History:  Diagnosis Date  . Arthritis   . Depression   . OAB (overactive bladder)   . Urge incontinence of urine     Past Surgical History:  Past Surgical History:  Procedure Laterality Date  . BREAST EXCISIONAL BIOPSY Left 2004   Benign  . left arm surgery with titanium plate   7984  . Right elbow ORIF  Right 12/01/2017   Dr.Menz  . Left total knee arthroplasty using computer-assisted navigation  05/14/2022   Dr Mardee  . BUNIONECTOMY Bilateral    left ~2014, right 2018  . COLONOSCOPY     . OTHER SURGICAL HISTORY 2015      left arm surgery with titanium plate    Family History:  Family History  Problem Relation Name Age of Onset  . Stroke Mother    . Heart disease Mother    . Other (Parkinson's disease) Father     Social History:  Social History   Socioeconomic History  . Marital status: Married    Spouse name: Morton MATSU  . Number of children: 1  . Years of education: 44  . Highest education level: Master's degree (e.g., MA, MS, MEng, MEd, MSW, MBA)  Occupational History  . Occupation: Retired Runner, broadcasting/film/video / Geophysicist/field seismologist  Tobacco Use  . Smoking status: Former    Current packs/day: 0.00    Average packs/day: 0.1 packs/day for 3.0 years (0.3 ttl pk-yrs)    Types: Cigarettes    Start date: 51    Quit date: 70    Years since quitting: 52.7  . Smokeless tobacco: Never  Vaping Use  . Vaping status: Never Used  Substance and Sexual Activity  . Alcohol use: Not Currently    Alcohol/week: 1.0 standard drink of alcohol    Types: 1 Glasses of wine per week  . Drug use: Never  . Sexual activity: Defer    Partners: Male   Social Drivers of Health   Financial Resource Strain: Low Risk  (09/06/2023)   Overall Financial Resource Strain (CARDIA)   . Difficulty of Paying Living Expenses: Not hard at all  Food Insecurity: No Food Insecurity (09/28/2023)   [facility] from [facility]   Hunger Vital Sign   . Within the past 12 months, you worried that your food would run out before you got the money to buy more.: Never true   . Within the past 12 months, the food you bought just didn't last and you didn't have money to get more.: Never true  Transportation Needs: No Transportation Needs (09/28/2023)   Received from [facility] - Transportation   . In the past 12 months, has lack of transportation kept you from medical appointments or from getting medications?:  No   . In the past 12 months, has lack of transportation kept you from meetings, work, or from getting  things needed for daily living?: No  Social Connections: Moderately Isolated (09/28/2023)   Received from [facility]   Social Connection and Isolation Panel   . In a typical week, how many times do you talk on the phone with family, friends, or neighbors?: Never   . How often do you get together with friends or relatives?: Never   . How often do you attend church or religious services?: Never   . Do you belong to any clubs or organizations such as church groups, unions, fraternal or athletic groups, or school groups?: No   . How often do you attend meetings of the clubs or organizations you belong to?: More than 4 times per year   . Are you married, widowed, divorced, separated, never married, or living with a partner?: Married  Housing Stability: Low Risk  (09/06/2023)   Housing Stability Vital Sign   . Unable to Pay for Housing in the Last Year: No   . Number of Times Moved in the Last Year: 0   . Homeless in the Last Year: No   Allergies:  Allergies  Allergen Reactions  . Donepezil Other (See Comments)    Sedation.   Medications: Current Outpatient Medications on File Prior to Visit  Medication Sig Dispense Refill  . alendronate (FOSAMAX) 70 MG tablet Take 70 mg by mouth every 7 (seven) days    . amLODIPine  (NORVASC ) 2.5 MG tablet Take 2.5 mg by mouth once daily    . ascorbic acid, vitamin C, (VITAMIN C) 1000 MG tablet Take 1,000 mg by mouth once daily    . aspirin  81 MG EC tablet Take 81 mg by mouth once daily    . atorvastatin  (LIPITOR) 40 MG tablet Take 40 mg by mouth once daily    . calcium  carbonate 500 mg calcium  (1,250 mg) tablet Take 1,000 mg of elemental by mouth once daily      . cholecalciferol (VITAMIN D3) 1,000 unit tablet Take 1,000 Units by mouth once daily    . cyanocobalamin  (VITAMIN B12) 1000 MCG tablet Take 1,000 mcg by mouth once daily    . multivitamin tablet Take 1 tablet by mouth once daily      . omega-3/dha/epa/dpa/fish oil (OMEGA-3 2100 ORAL) Take 1  capsule by mouth once daily      . vitamin K2 90 mcg Cap Take 1 capsule by mouth once daily     No current facility-administered medications on file prior to visit.   Dr. Jannett Fairly

## 2023-12-15 DIAGNOSIS — R2681 Unsteadiness on feet: Secondary | ICD-10-CM | POA: Diagnosis not present

## 2023-12-15 DIAGNOSIS — R2689 Other abnormalities of gait and mobility: Secondary | ICD-10-CM | POA: Diagnosis not present

## 2023-12-15 DIAGNOSIS — R5382 Chronic fatigue, unspecified: Secondary | ICD-10-CM | POA: Diagnosis not present

## 2023-12-15 DIAGNOSIS — I6522 Occlusion and stenosis of left carotid artery: Secondary | ICD-10-CM | POA: Diagnosis not present

## 2023-12-15 DIAGNOSIS — R55 Syncope and collapse: Secondary | ICD-10-CM | POA: Diagnosis not present

## 2023-12-19 DIAGNOSIS — R739 Hyperglycemia, unspecified: Secondary | ICD-10-CM | POA: Diagnosis not present

## 2023-12-19 DIAGNOSIS — I2510 Atherosclerotic heart disease of native coronary artery without angina pectoris: Secondary | ICD-10-CM | POA: Diagnosis not present

## 2023-12-19 DIAGNOSIS — E785 Hyperlipidemia, unspecified: Secondary | ICD-10-CM | POA: Diagnosis not present

## 2023-12-20 ENCOUNTER — Other Ambulatory Visit: Payer: Self-pay | Admitting: Adult Health

## 2023-12-20 ENCOUNTER — Telehealth: Payer: Self-pay

## 2023-12-20 DIAGNOSIS — I6522 Occlusion and stenosis of left carotid artery: Secondary | ICD-10-CM | POA: Diagnosis not present

## 2023-12-20 DIAGNOSIS — R2681 Unsteadiness on feet: Secondary | ICD-10-CM | POA: Diagnosis not present

## 2023-12-20 DIAGNOSIS — R2689 Other abnormalities of gait and mobility: Secondary | ICD-10-CM | POA: Diagnosis not present

## 2023-12-20 DIAGNOSIS — F01A0 Vascular dementia, mild, without behavioral disturbance, psychotic disturbance, mood disturbance, and anxiety: Secondary | ICD-10-CM

## 2023-12-20 DIAGNOSIS — I2510 Atherosclerotic heart disease of native coronary artery without angina pectoris: Secondary | ICD-10-CM

## 2023-12-20 DIAGNOSIS — R739 Hyperglycemia, unspecified: Secondary | ICD-10-CM

## 2023-12-20 DIAGNOSIS — R55 Syncope and collapse: Secondary | ICD-10-CM | POA: Diagnosis not present

## 2023-12-20 NOTE — Telephone Encounter (Signed)
 I called a [facility] lab partner and left a message informing them that order was placed and to advise if any additional action is required on our end.

## 2023-12-20 NOTE — Telephone Encounter (Signed)
 Pls let lab know that CRP and A1c were added so they can add them on to yesterday's lab. Thanks.

## 2023-12-20 NOTE — Telephone Encounter (Signed)
 Per Manuelita Shall Adult nurse), patient had labs drawn at [facility] yesterday and asked if she could have an A1c and C-reactive protein checked. Patients spouse stated  I am willing to pay out of pocket if necessary.  Reason for lab add on request is because the patient has a plant based diet.  Side Note: An extra tube of blood was drawn   If in agreement with request, please place orders.

## 2023-12-21 ENCOUNTER — Encounter: Payer: Self-pay | Admitting: Internal Medicine

## 2023-12-21 ENCOUNTER — Non-Acute Institutional Stay: Admitting: Internal Medicine

## 2023-12-21 VITALS — BP 124/76 | HR 73 | Temp 98.0°F | Ht 63.0 in | Wt 136.0 lb

## 2023-12-21 DIAGNOSIS — G3184 Mild cognitive impairment, so stated: Secondary | ICD-10-CM

## 2023-12-21 DIAGNOSIS — I10 Essential (primary) hypertension: Secondary | ICD-10-CM | POA: Diagnosis not present

## 2023-12-21 DIAGNOSIS — T733XXD Exhaustion due to excessive exertion, subsequent encounter: Secondary | ICD-10-CM

## 2023-12-21 DIAGNOSIS — E785 Hyperlipidemia, unspecified: Secondary | ICD-10-CM | POA: Diagnosis not present

## 2023-12-21 NOTE — Progress Notes (Signed)
 NURSING HOME LOCATION: [place] [facility]  CODE STATUS:  Full code  PCP: Abdul Fine, MD   This is a nursing facility follow up visit  of chronic medical diagnoses to document compliance with Regulation 483.30 (c) in The Long Term Care Survey Manual Phase 2 which mandates caregiver visit ( visits can alternate among physician, PA or NP as per statutes) within 10 days of 30 days / 60 days/ 90 days post admission to SNF date  .  Interim medical record and care since last SNF visit was updated with review of diagnostic studies and change in clinical status since last visit were documented.  HPI: Her husband accompanies her and is a primary historian.  She has had intractable fatigue which has dramatically decreased her exercise capacity according to her husband.  Onset was a year or less ago.  The fatigue occurs even while sedentary at times but is most dramatic  & associated with dyspnea with when walking half a mile or more.  She has to stop to recover.  She formally walked 3 miles a day.  He is questioning the need for an extensive rehab program to improve her endurance. He states that in February dental x-rays suggested carotid stenosis and her PCP was notified.Subsequently she had a left endarterectomy for 90% blockage on 7/2.  She experienced syncope 2 weeks after endarterectomy and was monitored in the ER overnight. A second syncopal event occurred at the [facility] gym while doing PT on an aerobic equipment.  A 14-day cardiac monitor was completed and stress test scheduled. Cardiac catheterization was performed 9/23 and revealed a moderate single-vessel coronary artery lesion with sequential 50% lesion noted as well.  In the mid LAD tortuosity was documented which was felt to possibly be due to hypertension.  Amlodipine  2.5 mg was prescribed.  They monitor the blood pressure at home and it typically runs 100-146/52-77. Labs were performed 12/19/2023.  LDL was 68 and HDL 60.  On 09/27/2023  TSH was low normal at 0.709 on no L-thyroxine supplementation.  On 1 9/25 the value had been therapeutic at 1.47. 09/23/2023 MRI revealed cerebral and cerebellar atrophy commensurate with age. On 09/27/2023 CT revealed patchy and confluent areas of decreased attenuation throughout the deep and periventricular white matter of the cerebral hemispheres bilaterally compatible with chronic microvascular ischemic disease.  There was no evidence of large territorial acute infarctions.Her husband states that she has seen a Neurologist who has initiated Namenda.  Past history includes lumbar spondylosis; orthostatic hypotension; OAB; history of vertigo; and mild cognitive impairment with memory loss.  There is a family history of heart disease and stroke in her mother.  Smoking history is vague ; apparently there was some experimentation with tobacco prior to her meeting her husband.  Review of systems: Dementia invalidated responses.  For the most part she really provided no history. Her husband states that they were both ill while they were on a cruise in March to [place] .  They do not believe they were tested for COVID.  The fatigue actually preceded that cruise.  Physical exam:  Pertinent or positive findings: She is thin but appears adequately nourished.  For the most part she remains quiet as her husband provides history.Eyebrows are decreased laterally.  Posterior tibial pulses are stronger than the dorsalis pedis pulses.  There is a suggestion of possible slight clubbing of the nailbeds.  General appearance: no acute distress, increased work of breathing is present.   Lymphatic: No lymphadenopathy about  the head, neck, axilla. Eyes: No conjunctival inflammation or lid edema is present. There is no scleral icterus. Ears:  External ear exam shows no significant lesions or deformities.   Nose:  External nasal examination shows no deformity or inflammation. Nasal mucosa are pink and moist without lesions,  exudates Oral exam:  Lips and gums are healthy appearing. There is no oropharyngeal erythema or exudate. Neck:  No thyromegaly, masses, tenderness noted.    Heart:  Normal rate and regular rhythm. S1 and S2 normal without gallop, murmur, click, rub .  Lungs: Chest clear to auscultation without wheezes, rhonchi, rales, rubs. Abdomen: Bowel sounds are normal. Abdomen is soft and nontender with no organomegaly, hernias, masses. GU: Deferred  Extremities:  No cyanosis, clubbing, edema  Neurologic exam :Deep tendon reflexes are equal Skin: Warm & dry w/o tenting. No significant lesions or rash.  See summary under each active problem in the Problem List with associated updated therapeutic plan:  Fatigue Serially the TSH has decreased.  On 03/24/2023 it was therapeutic at 1.47.  On 09/27/2023 TSH was suppressed to a low normal at 0.709 with normals of 0.35-4.5. TSH will be repeated with free T4 and T3 to rule out subclinical hyperthyroidism.  It is also possible her symptoms represent COVID long-hauler status. The medical literature will be consulted concerning appropriate assessment tools.  MCI (mild cognitive impairment) with memory loss Neurology specialist has prescribed Namenda.  I have asked her husband to assess clinical response to this particularly as to mood.  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  Dyslipidemia Confirm with Cardiology as to optimal LDL goal.  Essential hypertension Cardiology has placed her on low-dose calcium  channel blocker.  Blood pressures at home range from 100/52 up to 146/77.  The latter is an outlier.  Blood pressure appears to be well-controlled.  Blood pressure goal is an average of at least less than 130/80.

## 2023-12-21 NOTE — Assessment & Plan Note (Signed)
 Confirm with Cardiology as to optimal LDL goal.

## 2023-12-21 NOTE — Assessment & Plan Note (Addendum)
 Neurology specialist has prescribed Namenda.  I have asked her husband to assess clinical response to this particularly as to mood.  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

## 2023-12-21 NOTE — Patient Instructions (Signed)
 See assessment and plan under each diagnosis in the problem list and acutely for this visit ;  Fatigue Serially the TSH has decreased.  On 03/24/2023 it was therapeutic at 1.47.  On 09/27/2023 TSH was suppressed to a low normal at 0.709 with normals of 0.35-4.5. TSH will be repeated with free T4 and T3 to rule out subclinical hyperthyroidism.  Additionally consideration could be her symptoms representing daily COVID long-hauler.  The medical literature will be consulted.  MCI (mild cognitive impairment) with memory loss Neurology specialist has prescribed Namenda.  I have asked her husband to assess clinical response to this particularly as to mood.  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  Dyslipidemia Confirm with Cardiology as to optimal LDL goal.  Essential hypertension Cardiology has placed her on low-dose calcium  channel blocker.  Blood pressures at home ranges from 100/52 up to 146/77.  The latter is an outlier.  Blood pressure appears to be well-controlled.  Blood pressure goal is an average of at least less than 130/80.  12/22/2023 Her husband had requested a C-reactive protein.  UpToDate COVID-19 Long Hauler references reviewed; sed rate & Creatinine kinase (CK) will also be indicated.  Long hauler COVID certainly is a consideration; but the fatigue may be an exaggerated anginal equivalent in the context of her CNS imaging and cardiac results indicating significant and advanced vasculopathy.

## 2023-12-21 NOTE — Telephone Encounter (Signed)
 Called [facility] and spoke with Verneita and she stated No additional labs were ordered.   Placed order with Verneita for C-reactive and A1C.

## 2023-12-21 NOTE — Assessment & Plan Note (Addendum)
 Serially the TSH has decreased.  On 03/24/2023 it was therapeutic at 1.47.  On 09/27/2023 TSH was suppressed to a low normal at 0.709 with normals of 0.35-4.5. TSH will be repeated with free T4 and T3 to rule out subclinical hyperthyroidism.  It is also possible her symptoms represent COVID long-hauler status. The medical literature will be consulted concerning appropriate assessment tools.

## 2023-12-21 NOTE — Assessment & Plan Note (Addendum)
 Cardiology has placed her on low-dose calcium  channel blocker.  Blood pressures at home range from 100/52 up to 146/77.  The latter is an outlier.  Blood pressure appears to be well-controlled.  Blood pressure goal is an average of at least less than 130/80.

## 2023-12-22 ENCOUNTER — Encounter: Payer: Self-pay | Admitting: Internal Medicine

## 2023-12-22 DIAGNOSIS — R2681 Unsteadiness on feet: Secondary | ICD-10-CM | POA: Diagnosis not present

## 2023-12-22 DIAGNOSIS — I6522 Occlusion and stenosis of left carotid artery: Secondary | ICD-10-CM | POA: Diagnosis not present

## 2023-12-22 DIAGNOSIS — R55 Syncope and collapse: Secondary | ICD-10-CM | POA: Diagnosis not present

## 2023-12-22 DIAGNOSIS — R2689 Other abnormalities of gait and mobility: Secondary | ICD-10-CM | POA: Diagnosis not present

## 2023-12-22 LAB — LIPID PANEL
Cholesterol: 143 mg/dL (ref <200)
HDL: 60 mg/dL (ref >=50)
LDL Cholesterol (Calc): 68 mg/dL
Non-HDL Cholesterol (Calc): 83 mg/dL (ref <130)
Total CHOL/HDL Ratio: 2.4 (calc) (ref <5.0)
Triglycerides: 70 mg/dL (ref <150)

## 2023-12-22 LAB — TEST AUTHORIZATION

## 2023-12-22 LAB — HEMOGLOBIN A1C: Hgb A1c MFr Bld: 5.6 % (ref <5.7)

## 2023-12-22 LAB — C-REACTIVE PROTEIN: CRP: <3 mg/L (ref <8.0)

## 2023-12-22 LAB — EXTRA LAV TOP TUBE

## 2023-12-23 DIAGNOSIS — R41841 Cognitive communication deficit: Secondary | ICD-10-CM | POA: Diagnosis not present

## 2023-12-26 DIAGNOSIS — R413 Other amnesia: Secondary | ICD-10-CM | POA: Diagnosis not present

## 2023-12-27 ENCOUNTER — Other Ambulatory Visit (INDEPENDENT_AMBULATORY_CARE_PROVIDER_SITE_OTHER): Payer: Self-pay | Admitting: Nurse Practitioner

## 2023-12-27 DIAGNOSIS — I6522 Occlusion and stenosis of left carotid artery: Secondary | ICD-10-CM | POA: Diagnosis not present

## 2023-12-27 DIAGNOSIS — R2681 Unsteadiness on feet: Secondary | ICD-10-CM | POA: Diagnosis not present

## 2023-12-27 DIAGNOSIS — R55 Syncope and collapse: Secondary | ICD-10-CM | POA: Diagnosis not present

## 2023-12-27 DIAGNOSIS — R2689 Other abnormalities of gait and mobility: Secondary | ICD-10-CM | POA: Diagnosis not present

## 2023-12-27 DIAGNOSIS — I6523 Occlusion and stenosis of bilateral carotid arteries: Secondary | ICD-10-CM

## 2023-12-28 DIAGNOSIS — R41841 Cognitive communication deficit: Secondary | ICD-10-CM | POA: Diagnosis not present

## 2023-12-29 ENCOUNTER — Ambulatory Visit (INDEPENDENT_AMBULATORY_CARE_PROVIDER_SITE_OTHER): Admitting: Vascular Surgery

## 2023-12-29 ENCOUNTER — Encounter (INDEPENDENT_AMBULATORY_CARE_PROVIDER_SITE_OTHER): Payer: Self-pay | Admitting: Vascular Surgery

## 2023-12-29 ENCOUNTER — Ambulatory Visit (INDEPENDENT_AMBULATORY_CARE_PROVIDER_SITE_OTHER)

## 2023-12-29 VITALS — BP 135/84 | Resp 18 | Ht 63.0 in | Wt 135.6 lb

## 2023-12-29 DIAGNOSIS — R2681 Unsteadiness on feet: Secondary | ICD-10-CM | POA: Diagnosis not present

## 2023-12-29 DIAGNOSIS — I10 Essential (primary) hypertension: Secondary | ICD-10-CM

## 2023-12-29 DIAGNOSIS — I6522 Occlusion and stenosis of left carotid artery: Secondary | ICD-10-CM | POA: Diagnosis not present

## 2023-12-29 DIAGNOSIS — M1712 Unilateral primary osteoarthritis, left knee: Secondary | ICD-10-CM

## 2023-12-29 DIAGNOSIS — I6523 Occlusion and stenosis of bilateral carotid arteries: Secondary | ICD-10-CM | POA: Diagnosis not present

## 2023-12-29 DIAGNOSIS — R55 Syncope and collapse: Secondary | ICD-10-CM | POA: Diagnosis not present

## 2023-12-29 DIAGNOSIS — E785 Hyperlipidemia, unspecified: Secondary | ICD-10-CM | POA: Diagnosis not present

## 2023-12-29 DIAGNOSIS — R2689 Other abnormalities of gait and mobility: Secondary | ICD-10-CM | POA: Diagnosis not present

## 2023-12-29 NOTE — Progress Notes (Signed)
 MRN : [id number]  Julia Snyder is a 78 y.o. (1945-04-08) female who presents with chief complaint of check circulation.  History of Present Illness:   The patient is seen for follow up evaluation of carotid stenosis status post left carotid endarterectomy on 09/14/2023.  There were no post operative problems or complications related to the surgery.  The patient denies neck or incisional pain.  The patient denies interval amaurosis fugax. There is no recent history of TIA symptoms or focal motor deficits. There is no prior documented CVA.  The patient denies headache.  The patient is taking enteric-coated aspirin  81 mg daily.  No recent shortening of the patient's walking distance or new symptoms consistent with claudication.  No history of rest pain symptoms. No new ulcers or wounds of the lower extremities have occurred.  There is no history of DVT, PE or superficial thrombophlebitis. No recent episodes of angina or shortness of breath documented.   Duplex ultrasound of the carotid arteries obtained today demonstrates 1 to 39% stenosis of the internal carotid arteries bilaterally  Current Meds  Medication Sig   alendronate (FOSAMAX) 70 MG tablet Take 70 mg by mouth once a week.   amLODipine  (NORVASC ) 2.5 MG tablet Take 1 tablet (2.5 mg total) by mouth daily.   Ascorbic Acid (VITAMIN C) 1000 MG tablet Take 1,000 mg by mouth daily.   aspirin  EC 81 MG tablet Take 1 tablet (81 mg total) by mouth daily at 6 (six) AM. Swallow whole.   atorvastatin  (LIPITOR) 40 MG tablet Take 1 tablet (40 mg total) by mouth daily.   Calcium  Carbonate-Vit D-Min (CALCIUM  1200 PO) Take 1,200 mg by mouth daily. Vit D3 25 mcg   cyanocobalamin  2000 MCG tablet Take 2,000 mcg by mouth daily. Vit B12   Multiple Vitamins-Minerals (MULTIPLE VITAMINS/WOMENS PO) Take 1 tablet by mouth daily. Woman 50+   Omega-3 Fatty Acids (FISH OIL PO) Take 2,500  mg by mouth daily.   vitamin k 100 MCG tablet Take 100 mcg by mouth daily. Vit k7    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   Orthostatic hypotension 11/22/2022   Overactive bladder    Urge incontinence of urine    Vertigo 04/11/2018    Past Surgical History:  Procedure Laterality Date   BREAST BIOPSY Left 2004   benign   BUNIONECTOMY Bilateral    left ~2014, right 2018   COLONOSCOPY     ELBOW FRACTURE SURGERY Bilateral    right 2019, left ~2015   ENDARTERECTOMY Left 09/14/2023   Procedure: ENDARTERECTOMY, CAROTID;  Surgeon: Jama Cordella MATSU, MD;  Location: [facility] ORS;  Service: Vascular;  Laterality: Left;   FRACTURE SURGERY     KNEE ARTHROPLASTY Left 05/14/2022   Procedure: COMPUTER ASSISTED TOTAL KNEE ARTHROPLASTY - RNFA;  Surgeon: Mardee Lynwood SQUIBB, MD;  Location: [facility] ORS;  Service:  Orthopedics;  Laterality: Left;   LEFT HEART CATH AND CORONARY ANGIOGRAPHY N/A 12/06/2023   Procedure: LEFT HEART CATH AND CORONARY ANGIOGRAPHY;  Surgeon: Mady Bruckner, MD;  Location: [facility] INVASIVE CV LAB;  Service: Cardiovascular;  Laterality: N/A;   ORIF ELBOW FRACTURE Right 12/01/2017   Procedure: OPEN REDUCTION INTERNAL FIXATION (ORIF) ELBOW/OLECRANON FRACTURE;  Surgeon: Kathlynn Sharper, MD;  Location: [facility] ORS;  Service: Orthopedics;  Laterality: Right;   OTHER SURGICAL HISTORY  2015   left arm surgery with titanium plate    Social History Social History   Tobacco Use   Smoking status: Former    Types: Cigarettes    Passive exposure: Past   Smokeless tobacco: Never   Tobacco comments:    Quit date was unknown   Advertising account planner   Vaping status: Never Used  Substance Use Topics   Alcohol use: Yes    Comment: occassional   Drug use: Never    Family  History Family History  Problem Relation Age of Onset   Stroke Mother    Heart disease Mother    Parkinson's disease Father    Diabetes Neg Hx    Cancer Neg Hx     Allergies  Allergen Reactions   Donepezil Other (See Comments)    Sedation.      REVIEW OF SYSTEMS (Negative unless checked)  Constitutional: [] Weight loss  [] Fever  [] Chills Cardiac: [] Chest pain   [] Chest pressure   [] Palpitations   [] Shortness of breath when laying flat   [] Shortness of breath with exertion. Vascular:  [x] Pain in legs with walking   [] Pain in legs at rest  [] History of DVT   [] Phlebitis   [] Swelling in legs   [] Varicose veins   [] Non-healing ulcers Pulmonary:   [] Uses home oxygen   [] Productive cough   [] Hemoptysis   [] Wheeze  [] COPD   [] Asthma Neurologic:  [] Dizziness   [] Seizures   [] History of stroke   [] History of TIA  [] Aphasia   [] Vissual changes   [] Weakness or numbness in arm   [x] Weakness or numbness in leg Musculoskeletal:   [] Joint swelling   [x] Joint pain   [x] Low back pain Hematologic:  [] Easy bruising  [] Easy bleeding   [] Hypercoagulable state   [] Anemic Gastrointestinal:  [] Diarrhea   [] Vomiting  [] Gastroesophageal reflux/heartburn   [] Difficulty swallowing. Genitourinary:  [] Chronic kidney disease   [] Difficult urination  [] Frequent urination   [] Blood in urine Skin:  [] Rashes   [] Ulcers  Psychological:  [] History of anxiety   []  History of major depression.  Physical Examination  Vitals:   12/29/23 0834  BP: 135/84  Resp: 18  Weight: 135 lb 9.6 oz (61.5 kg)  Height: 5' 3 (1.6 m)   Body mass index is 24.02 kg/m. Gen: WD/WN, NAD Head: [place]/AT, No temporalis wasting.  Ear/Nose/Throat: Hearing grossly intact, nares w/o erythema or drainage Eyes: PER, EOMI, sclera nonicteric.  Neck: Supple, no masses.  No bruit or JVD.  Pulmonary:  Good air movement, no audible wheezing, no use of accessory muscles.  Cardiac: RRR, normal S1, S2, no Murmurs. Vascular:  mild trophic changes, no  open wounds Vessel Right Left  Radial Palpable Palpable  Carotid  Palpable Palpable  Gastrointestinal: soft, non-distended. No guarding/no peritoneal signs.  Musculoskeletal: M/S 5/5 throughout.  No visible deformity.  Neurologic: CN 2-12 intact. Pain and light touch intact in extremities.  Symmetrical.  Speech is fluent. Motor exam as listed above. Psychiatric: Judgment intact, Mood & affect appropriate for pt's clinical situation. Dermatologic: No rashes or ulcers  noted.  No changes consistent with cellulitis.   CBC Lab Results  Component Value Date   WBC 7.5 12/01/2023   HGB 13.6 12/01/2023   HCT 42.3 12/01/2023   MCV 90 12/01/2023   PLT 319 12/01/2023    BMET    Component Value Date/Time   NA 137 12/01/2023 1626   K 4.5 12/01/2023 1626   CL 98 12/01/2023 1626   CO2 23 12/01/2023 1626   GLUCOSE 92 12/01/2023 1626   GLUCOSE 101 (H) 09/28/2023 0356   BUN 16 12/01/2023 1626   CREATININE 0.73 12/01/2023 1626   CALCIUM  10.2 12/01/2023 1626   GFRNONAA >60 09/28/2023 0356   CrCl cannot be calculated (Patient's most recent lab result is older than the maximum 21 days allowed.).  COAG No results found for: INR, PROTIME  Radiology CARDIAC CATHETERIZATION Result Date: 12/06/2023 Conclusions: Moderate single-vessel coronary artery disease, with sequential 50-60% lesions in the mid LAD.  The LAD is quite tortuous suggestive of hypertensive heart disease.  No significant disease involving the LMCA, LCX, and RCA. Normal left ventricular systolic function (LVEF 55-65%) and filling pressure (LVEDP 15 mmHg).. Recommendations: Add amlodipine  2.5 mg daily blood pressure control and antianginal therapy. Aggressive secondary prevention of atherosclerotic cardiovascular disease. If the patient were to develop refractory angina (currently only complains of fatigue), PCI to the mid LAD could be considered. Lonni Hanson, MD [facility]  LONG TERM MONITOR XT (3-14 DAYS) Result Date:  12/02/2023 Patch Wear Time:  13 days and 19 hours HR 48 - 194, average 71 bpm. 24 nonsustained SVT (longest 21 beats) No atrial fibrillation detected. Rare supraventricular ectopy. Rare ventricular ectopy. No sustained arrhythmias. No symptom trigger episodes. [facility] Cardiac Electrophysiology    Assessment/Plan 1. Bilateral carotid artery stenosis (Primary) Recommend:  She is status post successful left carotid endarterectomy.  Given the patient's asymptomatic subcritical stenosis no further invasive testing or surgery at this time.  Duplex ultrasound shows 1-39 stenosis bilaterally.  Continue antiplatelet therapy as prescribed Continue management of CAD, HTN and Hyperlipidemia Healthy heart diet,  encouraged exercise at least 4 times per week  Follow up in 6 months with duplex ultrasound and physical exam  - VAS US  CAROTID; Future  2. Essential hypertension Continue antihypertensive medications as already ordered, these medications have been reviewed and there are no changes at this time.  3. Primary osteoarthritis of left knee Continue medications to treat the patient's degenerative disease as already ordered, these medications have been reviewed and there are no changes at this time.  Continued activity and therapy was stressed.  4. Dyslipidemia Continue statin as ordered and reviewed, no changes at this time     Cordella Shawl, MD  12/29/2023 8:46 AM

## 2023-12-30 NOTE — Progress Notes (Unsigned)
 Cardiology Clinic Note   Date: 01/02/2024 ID: Neha, Waight 08-22-1945, MRN [id number]  Primary Cardiologist:  Evalene Lunger, MD  Chief Complaint   Julia Snyder is a 78 y.o. female who presents to the clinic today for follow up after testing.   Patient Profile   Julia Snyder is followed by Dr. Gollan for the history outlined below.      Past medical history significant for: CAD. Stress echo 11/24/2023: Hypokinesis of the apical septal segment and apex at peak stress concerning for LAD ischemia.  Normal HR/BP response to exercise.  Positive stress echo for ischemia in the distribution of the LAD.  Intermediate risk study.  LHC 12/06/2023: Moderate single-vessel coronary artery disease with sequential 50 to 60% lesion in the mid LAD.  LAD is quite tortuous suggestive of hypertensive heart disease.  No significant disease involving the LMCA, LCx, and RCA. Carotid artery stenosis. CTA neck 07/05/2023: Approximately 75% stenosis of proximal left ICA. Left carotid endarterectomy 09/14/2023. Carotid ultrasound 09/28/2023: Mild atherosclerotic changes in the proximal right ICA <50%.  No evidence of significant atherosclerotic changes or flow-limiting stenosis left carotid system. Hypertension. Hyperlipidemia. Mild cognitive impairment with memory loss. Presyncope. 2 weeks ZIO 12/01/2023: HR 48 to 194 bpm, average 71 bpm.  24 runs of NSVT longest 21 beats.  No A-fib detected.  In summary, patient has a history of carotid artery stenosis with critical stenosis of left ICA.  She underwent carotid endarterectomy on 09/14/2023.  Patient was first evaluated by Dr. Gollan on 12/01/2023 for positive stress test.  She had been seen by [facility] cardiology on 11/17/2023 with reports of fatigue and episode of near syncope.  She reported 2 weeks post endarterectomy she broke out in a cold sweat for about 10 to 15 minutes and was 90% passed out per her husband.  She rested in bed and about 30 minutes later  she felt better.  She saw the nurse at [facility] and was told to present to the ER.  She was kept overnight with no significant findings.  3 weeks prior to visit with Dr. Gollan she was in physical therapy when she became extremely weak and could not barely talk prompting EMS to be called.  She did not lose consciousness but felt very weak with persistent fatigue but declined ER.  She underwent stress echo on 11/24/2023 which was intermediate risk with concern for ischemia in the distribution of the LAD.  She agreed to [facility] which was performed on 08/05/2023 showing moderate single-vessel CAD as detailed above.  Near syncope was attributed to possible vasovagal event.     History of Present Illness    Today, patient is accompanied by her husband. Patient with limited participation in visit. Her husband answers most questions. He reports patient continues to have increased fatigue and dyspnea with exertion. He reports she was previously walking 1-2 miles daily and now gives out after 1/4-1/2 a mile. He has not noticed lower extremity edema. Patient denies chest pain. Patient's husband inquires about cardiac rehab or physical therapy to help build patient back up.      ROS: Limited secondary to patient with cognitive impairment.   EKGs/Labs Reviewed       EKG not performed today.   09/28/2023: ALT 24; AST 27 12/01/2023: BUN 16; Creatinine, Ser 0.73; Potassium 4.5; Sodium 137   12/01/2023: Hemoglobin 13.6; WBC 7.5   09/27/2023: TSH 0.709    Physical Exam    VS:  BP 132/70  Pulse 65   Ht 5' 3 (1.6 m)   Wt 136 lb 9.6 oz (62 kg)   SpO2 97%   BMI 24.20 kg/m  , BMI Body mass index is 24.2 kg/m.  GEN: Well nourished, well developed, in no acute distress. Neck: No JVD or carotid bruits. Cardiac:  RRR.  No murmur. No rubs or gallops.   Respiratory:  Respirations regular and unlabored. Clear to auscultation without rales, wheezing or rhonchi. GI: Soft, nontender, nondistended. Extremities:  Radials/DP/PT 2+ and equal bilaterally. No clubbing or cyanosis. No edema. Right wrist cath site is well healed with very mild resolving ecchymosis and no erythema, edema, drainage or tenderness.  Skin: Warm and dry, no rash. Neuro: Strength intact.  Assessment & Plan   CAD Positive stress echo 11/24/2023.  LHC 12/06/2023 demonstrated moderate single-vessel CAD with recommendation for medical therapy.  Patient continues to have fatigue. Patient's husband inquired about cardiac rehab or physical therapy (see below). Discussed continuing to monitor symptoms and patient's husband is in agreement.  Right wrist cath site is well healed with very mild resolving ecchymosis and no erythema, edema, drainage or tenderness. 2+ radial pulse.  - Continue amlodipine , atorvastatin , aspirin .  Carotid artery stenosis S/p left carotid endarterectomy 09/14/2023.  Carotid ultrasound 09/28/2023 demonstrated no evidence of significant atherosclerotic changes or flow-limiting stenosis in the left carotid system, right ICA <50%. No further presyncopal or syncopal events.  - Continue aspirin , atorvastatin . - Continue to follow with vascular surgery.  Hypertension BP today 132/70.  - Continue amlodipine .  Presyncope 2 weeks ZIO 12/01/2023 demonstrated HR 48, 94 bpm, average 71 bpm, 24 runs of NSVT longest 21 beats.  Patient's husband denies further presyncopal events.  - Continue to monitor.   Fatigue Patient continues to experience increased fatigue per her husband. Discussed a component of deconditioning given the procedures and testing patient had over the last few months. Patient's husband requests referral for cardiac rehab or physical therapy. Referral will be placed for physical therapy at [facility]. Discussed close follow up to assess improvement.  - Refer to physical therapy for aerobic/strength training.  Disposition: Refer to physical therapy. Return in 3 months or sooner as needed.           Signed, Barnie HERO. Isaac Dubie, DNP, NP-C

## 2024-01-02 ENCOUNTER — Encounter (INDEPENDENT_AMBULATORY_CARE_PROVIDER_SITE_OTHER)

## 2024-01-02 ENCOUNTER — Encounter: Payer: Self-pay | Admitting: Student

## 2024-01-02 ENCOUNTER — Ambulatory Visit (INDEPENDENT_AMBULATORY_CARE_PROVIDER_SITE_OTHER): Admitting: Vascular Surgery

## 2024-01-02 ENCOUNTER — Ambulatory Visit: Attending: Student | Admitting: Student

## 2024-01-02 VITALS — BP 132/70 | HR 65 | Ht 63.0 in | Wt 136.6 lb

## 2024-01-02 DIAGNOSIS — R55 Syncope and collapse: Secondary | ICD-10-CM | POA: Insufficient documentation

## 2024-01-02 DIAGNOSIS — R5383 Other fatigue: Secondary | ICD-10-CM | POA: Insufficient documentation

## 2024-01-02 DIAGNOSIS — I10 Essential (primary) hypertension: Secondary | ICD-10-CM | POA: Diagnosis present

## 2024-01-02 DIAGNOSIS — I2510 Atherosclerotic heart disease of native coronary artery without angina pectoris: Secondary | ICD-10-CM | POA: Diagnosis present

## 2024-01-02 DIAGNOSIS — Z9889 Other specified postprocedural states: Secondary | ICD-10-CM | POA: Insufficient documentation

## 2024-01-02 DIAGNOSIS — I6523 Occlusion and stenosis of bilateral carotid arteries: Secondary | ICD-10-CM | POA: Insufficient documentation

## 2024-01-02 NOTE — Patient Instructions (Signed)
 Medication Instructions:   Your physician recommends that you continue on your current medications as directed. Please refer to the Current Medication list given to you today.    *If you need a refill on your cardiac medications before your next appointment, please call your pharmacy*  Lab Work:  None ordered at this time   If you have labs (blood work) drawn today and your tests are completely normal, you will receive your results only by:  MyChart Message (if you have MyChart) OR  A paper copy in the mail If you have any lab test that is abnormal or we need to change your treatment, we will call you to review the results.  Testing/Procedures:  None ordered at this time   Referrals:  Your cardiologist has referred you to Physical Therapy and Rehab  We have attached their office location and phone number below.  Please allow them 3-5 business days to reach out to you to make an appointment.  If you have not heard from their office within that time, please call them to schedule your appointment.    Follow-Up:  At [facility], you and your health needs are our priority.  As part of our continuing mission to provide you with exceptional heart care, our providers are all part of one team.  This team includes your primary Cardiologist (physician) and Advanced Practice Providers or APPs (Physician Assistants and Nurse Practitioners) who all work together to provide you with the care you need, when you need it.  Your next appointment:   3 month(s)  Provider:    You may see one of the following Advanced Practice Providers on your designated Care Team:   Lonni Meager, NP Lesley Maffucci, PA-C Bernardino Bring, PA-C Cadence Canton, PA-C Tylene Lunch, NP Barnie Hila, NP    We recommend signing up for the patient portal called MyChart.  Sign up information is provided on this After Visit Summary.  MyChart is used to connect with patients for Virtual Visits (Telemedicine).   Patients are able to view lab/test results, encounter notes, upcoming appointments, etc.  Non-urgent messages can be sent to your provider as well.   To learn more about what you can do with MyChart, go to [email or web address].

## 2024-01-04 DIAGNOSIS — R41841 Cognitive communication deficit: Secondary | ICD-10-CM | POA: Diagnosis not present

## 2024-01-05 DIAGNOSIS — R413 Other amnesia: Secondary | ICD-10-CM | POA: Diagnosis not present

## 2024-01-06 DIAGNOSIS — Z23 Encounter for immunization: Secondary | ICD-10-CM | POA: Diagnosis not present

## 2024-01-16 DIAGNOSIS — R41841 Cognitive communication deficit: Secondary | ICD-10-CM | POA: Diagnosis not present

## 2024-01-17 ENCOUNTER — Ambulatory Visit: Payer: Self-pay | Admitting: Nurse Practitioner

## 2024-01-23 DIAGNOSIS — R413 Other amnesia: Secondary | ICD-10-CM | POA: Diagnosis not present

## 2024-01-24 DIAGNOSIS — Z23 Encounter for immunization: Secondary | ICD-10-CM | POA: Diagnosis not present

## 2024-01-25 ENCOUNTER — Encounter: Payer: Self-pay | Admitting: Physical Therapy

## 2024-01-25 ENCOUNTER — Ambulatory Visit: Attending: Student | Admitting: Physical Therapy

## 2024-01-25 ENCOUNTER — Other Ambulatory Visit: Payer: Self-pay

## 2024-01-25 DIAGNOSIS — R41841 Cognitive communication deficit: Secondary | ICD-10-CM | POA: Diagnosis not present

## 2024-01-25 DIAGNOSIS — R5381 Other malaise: Secondary | ICD-10-CM | POA: Diagnosis present

## 2024-01-25 DIAGNOSIS — M6281 Muscle weakness (generalized): Secondary | ICD-10-CM | POA: Insufficient documentation

## 2024-01-25 DIAGNOSIS — R5383 Other fatigue: Secondary | ICD-10-CM | POA: Insufficient documentation

## 2024-01-25 NOTE — Therapy (Signed)
 OUTPATIENT PHYSICAL THERAPY NEURO EVALUATION   Patient Name: Julia Snyder MRN: [id number] DOB:1945-11-06, 78 y.o., female Today's Date: 01/26/2024   PCP:    Laurence Locus, DO   REFERRING PROVIDER: Loistine Sober, NP   END OF SESSION:  PT End of Session - 01/25/24 0802     Visit Number 1    Number of Visits 10    Date for Recertification  03/01/24    PT Start Time 0805    PT Stop Time 0900    PT Time Calculation (min) 55 min    Equipment Utilized During Treatment Gait belt    Activity Tolerance Patient tolerated treatment well    Behavior During Therapy [facility] for tasks assessed/performed          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   Orthostatic hypotension 11/22/2022   Overactive bladder    Urge incontinence of urine    Vertigo 04/11/2018   Past Surgical History:  Procedure Laterality Date   BREAST BIOPSY Left 2004   benign   BUNIONECTOMY Bilateral    left ~2014, right 2018   COLONOSCOPY     ELBOW FRACTURE SURGERY Bilateral    right 2019, left ~2015   ENDARTERECTOMY Left 09/14/2023   Procedure: ENDARTERECTOMY, CAROTID;  Surgeon: Jama Cordella MATSU, MD;  Location: [facility] ORS;  Service: Vascular;  Laterality: Left;   FRACTURE SURGERY     KNEE ARTHROPLASTY Left 05/14/2022   Procedure: COMPUTER ASSISTED TOTAL KNEE ARTHROPLASTY - RNFA;  Surgeon: Mardee Lynwood SQUIBB, MD;  Location: [facility] ORS;  Service: Orthopedics;  Laterality: Left;   LEFT HEART CATH AND CORONARY ANGIOGRAPHY N/A 12/06/2023   Procedure: LEFT HEART CATH AND CORONARY ANGIOGRAPHY;  Surgeon: Mady Bruckner, MD;  Location: [facility] INVASIVE CV LAB;  Service: Cardiovascular;  Laterality: N/A;   ORIF ELBOW FRACTURE Right 12/01/2017   Procedure: OPEN REDUCTION INTERNAL  FIXATION (ORIF) ELBOW/OLECRANON FRACTURE;  Surgeon: Kathlynn Sharper, MD;  Location: [facility] ORS;  Service: Orthopedics;  Laterality: Right;   OTHER SURGICAL HISTORY  2015   left arm surgery with titanium plate   Patient Active Problem List   Diagnosis Date Noted   Essential hypertension 12/21/2023   Abnormal stress test 12/06/2023   Fatigue 12/06/2023   Near syncope 09/27/2023   Carotid stenosis, asymptomatic 09/14/2023   Carotid stenosis 07/17/2023   Hyperlipidemia 07/17/2023   Arthropathy of lumbar facet joint 06/20/2023   Lumbar spondylosis 06/20/2023   Low back pain 06/20/2023   Nocturnal cough 01/03/2023   Orthostatic hypotension 11/22/2022   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   Osteoarthritis of left knee 03/16/2022   MCI (mild cognitive impairment) with memory loss 12/28/2021   Mood disorder 02/26/2019   Vertigo 04/11/2018   Preventative health care 02/22/2018   Advance directive discussed with patient 02/22/2018   Urge incontinence of urine    Dyslipidemia 02/25/2017   Increased frequency of urination 02/25/2017   Hallux valgus, acquired 09/10/2016   Closed fracture of lower end of humerus 11/10/2011   Pain in elbow 06/08/2011    ONSET DATE: > 6 months   REFERRING DIAG:  Diagnosis  R53.83 (ICD-10-CM) - Fatigue, unspecified type    THERAPY DIAG:  Physical deconditioning  Muscle weakness (generalized)  Rationale for Evaluation and Treatment: Rehabilitation  SUBJECTIVE:                                                                                                                                                                                             SUBJECTIVE STATEMENT:  Husband present throughout session. Reports hx of cardiac catheterization after stress test.   States that when she is walking, will get some SOB after 1/4 of a mile. Husband states that she was walking >3 miles prior to cardiac issues.    Reports multiple bouts of near syncopal  episodes within the last few weeks. Including once at PT at [facility] for 3-4 weeks for balance   Husband feels that Cardiac rehab may be more appropriate for management of endurance deficits.   Pt accompanied by: significant other  PERTINENT HISTORY:   From recent MD visit CAD Positive stress echo 11/24/2023.  LHC 12/06/2023 demonstrated moderate single-vessel CAD with recommendation for medical therapy.  Patient continues to have fatigue. Patient's husband inquired about cardiac rehab or physical therapy (see below). Discussed continuing to monitor symptoms and patient's husband is in agreement.  Right wrist cath site is well healed with very mild resolving ecchymosis and no erythema, edema, drainage or tenderness. 2+ radial pulse.  - Continue amlodipine , atorvastatin , aspirin .   Carotid artery stenosis S/p left carotid endarterectomy 09/14/2023.  Carotid ultrasound 09/28/2023 demonstrated no evidence of significant atherosclerotic changes or flow-limiting stenosis in the left carotid system, right ICA <50%. No further presyncopal or syncopal events.  - Continue aspirin , atorvastatin . - Continue to follow with vascular surgery.   Hypertension BP today 132/70.  - Continue amlodipine .   Presyncope 2 weeks ZIO 12/01/2023 demonstrated HR 48, 94 bpm, average 71 bpm, 24 runs of NSVT longest 21 beats.  Patient's husband denies further presyncopal events.  - Continue to monitor.    Fatigue Patient continues to experience increased fatigue per her husband. Discussed a component of deconditioning given the procedures and testing patient had over the last few months. Patient's husband requests referral for cardiac rehab or physical therapy. Referral will be placed for physical therapy at [facility]. Discussed close follow up to assess improvement.  - Refer to physical therapy for aerobic/strength training.  PAIN:  Are you having pain? No  PRECAUTIONS: Fall and Other: near syncope   RED  FLAGS: Bowel or bladder incontinence: Yes: bladder management    WEIGHT BEARING RESTRICTIONS: No  FALLS: Has patient fallen in last 6 months? Yes. Number of falls 2 near syncopal episodes.   LIVING ENVIRONMENT: Lives with: lives with their spouse Lives in: House/apartment Stairs: No Has following equipment at home: None  PLOF: Independent, Independent with basic ADLs, Independent with household mobility without device, and Independent with community mobility without device  PATIENT GOALS: improve endurance.   OBJECTIVE:  Note: Objective measures were completed at Evaluation unless otherwise noted.  DIAGNOSTIC FINDINGS:   US  Carotid  IMPRESSION: 1. Right carotid system: Mild atherosclerotic changes in the proximal right internal carotid artery is estimated stenosis measuring less than 50%. 2. Left carotid system: No evidence of significant atherosclerotic change or flow-limiting stenosis. Within normal limits.   CARDIAC CATHETERIZATION [[id number]] Resulted: 12/06/23 1437  Order Status: Completed Updated: 12/07/23 0754  Narrative:    Conclusions: Moderate single-vessel coronary artery disease, with sequential 50-60% lesions in the mid LAD.  The LAD is quite tortuous suggestive of hypertensive heart disease.  No significant disease involving the LMCA, LCX, and RCA. Normal left ventricular systolic function (LVEF 55-65%) and filling pressure (LVEDP 15 mmHg)..    COGNITION: Overall cognitive status: History of cognitive impairments - at baseline   SENSATION: WFL  COORDINATION: A to K - WFL     POSTURE: rounded shoulders and forward head  LOWER EXTREMITY ROM:     WFL  LOWER EXTREMITY MMT:    MMT Right Eval Left Eval  Hip flexion 4+ 4+  Hip extension    Hip abduction 4+ 4+  Hip adduction 5 5  Hip internal rotation    Hip external rotation    Knee flexion 4+ 4+  Knee extension 5 5  Ankle dorsiflexion 5 5  Ankle plantarflexion 4 4  Ankle inversion     Ankle eversion    (Blank rows = not tested)  BED MOBILITY:  Not tested  TRANSFERS: Sit to stand: Complete Independence  Assistive device utilized: None     Stand to sit: Complete Independence  Assistive device utilized: None     Chair to chair: Complete Independence  Assistive device utilized: None       RAMP:  Not tested  CURB:  Findings: Mod I wit self selected UE support on rail   STAIRS: Findings: Level of Assistance: Modified independence, Number of Stairs: 4, Height of Stairs: 4   , and Comments: light UE support  GAIT: Findings: Gait Characteristics: decreased trunk rotation, Distance walked: 1571ft, Assistive device utilized:None, Level of assistance: Complete Independence, and Comments: reports mild SOB 2/10 in 6 min walk test.   FUNCTIONAL TESTS:  5 times sit to stand: 10.16  30 seconds chair stand test 16.5 HR increased to 105 decreased to 78 within 45 sec rest  6 minute walk test: 1526ft  PATIENT SURVEYS:                                                                                                                                TREATMENT DATE: 01/26/2024  PT evaluation:   VS assessment:  Sitting: 139/67(84) HR 61 SpO2: 100% Standing 0 min : 135/72(91) HR 76 SpO2: 100%   Standing 2 min: 127/73(87) HR 72  SpO2 100% Standing 4 min:  133/68(84) HR 71: SpO2 100%    6 Min Walk Test:  Instructed patient to ambulate as quickly and as safely as possible for 6 minutes using LRAD. Patient was allowed to take standing rest breaks without stopping the test, but if the patient required a sitting rest break the clock would be stopped and the test would be over.  Results: 1511 feet (460.5528 meters, Avg speed 1.77m/s) using no AD without assist. Results indicate that the patient has reduced endurance with ambulation compared to age matched norms.  Age Matched Norms: 74-69 yo M: 78 F: 45, 23-79 yo M: 50 F: 471, 26-89 yo M: 417 F: 392 MDC: 58.21 meters (190.98 feet) or 50  meters (ANPTA Core Set of Outcome Measures for Adults with Neurologic Conditions, 2018)  VS assessed following 6 min walk test. 124/63 (79) SpO2 100% HR 74 pt rates SOB 2/10 at completion of 6 min wlak test.     PATIENT EDUCATION: Education details: POC. Benefits of skilled PT to address endurance and strengthen deficits; will benefit with transition of care to personal training or self directed fitness program to return to PLOF within several weeks  Person educated: Patient and Spouse Education method: Explanation and Demonstration Education comprehension: verbalized understanding and returned demonstration  HOME EXERCISE PROGRAM: To be estabilished at subsequent visit   GOALS: Goals reviewed with patient? Yes   SHORT TERM GOALS: Target date: 02/23/2024    Patient will be independent in home exercise program to improve strength/mobility for better functional independence with ADLs. Baseline: to be initiated at visit 2.  Goal status: INITIAL   LONG TERM GOALS: Target date: 03/01/2024    1.  Patient will increase 30 sec sit to stand by >2 full repetitions indicating an increased LE strength and improved balance. Baseline: 16.5 Goal status: INITIAL  2.  Patient will increase 6 min walk to at least 6 aged matched norm indicating improved function and tolerance to increased community activity  Baseline: 1561ft Goal status: INITIAL  4.  Patient will ascend a flight of stairs without pain and SOB only 2/10 to indicate improved access to community and family events.  Baseline: to be assessed Goal status: INITIAL    ASSESSMENT:  CLINICAL IMPRESSION: Patient is a 78 y.o. female who was seen today for physical therapy evaluation and treatment for deconditioning and fatigue due to CAD and carotid artery stenosis. Pt's husband present throughout session answering the majority of questions for pt. States that prior to knee surgery 1 year prior, she walk walking >3 miles several  times per day. Husband now reports that she can only tolerate >1/4 of a mile without experiencing SOB and fatigue.  Pt is noted to have only mild SOB with LE power assessment of 30 sec sit to stand, and slightly decreased distance compared to aged matched norms with SOB rated 2/10. Education provided on fatigue level increase in the afternoons given hx of cognitive decline as well as benefits for reduced time from for PT services  and transition to self directed fitness program to continue to return to PLOF. Pt will benefit from Skilled PT treatment to address endurance and strength deficits to improve function and allow return to PLOF and improve overall QoL.   OBJECTIVE IMPAIRMENTS: Abnormal gait, cardiopulmonary status limiting activity, decreased activity tolerance, decreased cognition, decreased coordination, decreased endurance, decreased knowledge of condition, decreased knowledge of use of DME, decreased mobility, difficulty walking, decreased ROM, and decreased strength.   ACTIVITY  LIMITATIONS: stairs and locomotion level  PARTICIPATION LIMITATIONS: shopping, community activity, and church  PERSONAL FACTORS: 1-2 comorbidities: CAD, HTN are also affecting patient's functional outcome.   REHAB POTENTIAL: Excellent  CLINICAL DECISION MAKING: Stable/uncomplicated  EVALUATION COMPLEXITY: Low  PLAN:  PT FREQUENCY: 1-2x/week  PT DURATION: other: 5 weeks   PLANNED INTERVENTIONS: 97164- PT Re-evaluation, 97750- Physical Performance Testing, 97110-Therapeutic exercises, 97530- Therapeutic activity, [id number]- Neuromuscular re-education, 97535- Self Care, [id number]- Manual therapy, [phone]- Gait training, [phone]- Aquatic Therapy, Patient/Family education, Balance training, Stair training, Cognitive remediation, Cryotherapy, and Moist heat  PLAN FOR NEXT SESSION:   Introduction to well zone. Initiate walking program.  Nustep.  Generalized strengthening.   Massie FORBES Dollar, PT 01/26/2024, 9:17  AM

## 2024-01-30 DIAGNOSIS — R41841 Cognitive communication deficit: Secondary | ICD-10-CM | POA: Diagnosis not present

## 2024-01-31 ENCOUNTER — Encounter: Payer: Self-pay | Admitting: Physical Therapy

## 2024-01-31 ENCOUNTER — Ambulatory Visit: Admitting: Physical Therapy

## 2024-01-31 DIAGNOSIS — R5383 Other fatigue: Secondary | ICD-10-CM | POA: Diagnosis not present

## 2024-01-31 DIAGNOSIS — R5381 Other malaise: Secondary | ICD-10-CM | POA: Diagnosis not present

## 2024-01-31 DIAGNOSIS — M6281 Muscle weakness (generalized): Secondary | ICD-10-CM | POA: Diagnosis not present

## 2024-01-31 NOTE — Therapy (Signed)
 OUTPATIENT PHYSICAL THERAPY NEURO EVALUATION   Patient Name: Julia Snyder MRN: [id number] DOB:[birth year], 78 y.o., female Today's Date: 01/31/2024   PCP:    Laurence Locus, DO   REFERRING PROVIDER: Loistine Sober, NP   END OF SESSION:  PT End of Session - 01/31/24 0806     Visit Number 2    Number of Visits 10    Date for Recertification  03/01/24    PT Start Time 0804    PT Stop Time 0845    PT Time Calculation (min) 41 min    Equipment Utilized During Treatment Gait belt    Activity Tolerance Patient tolerated treatment well    Behavior During Therapy [facility] for tasks assessed/performed          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   Orthostatic hypotension 11/22/2022   Overactive bladder    Urge incontinence of urine    Vertigo 04/11/2018   Past Surgical History:  Procedure Laterality Date   BREAST BIOPSY Left 2004   benign   BUNIONECTOMY Bilateral    left ~2014, right 2018   COLONOSCOPY     ELBOW FRACTURE SURGERY Bilateral    right 2019, left ~2015   ENDARTERECTOMY Left 09/14/2023   Procedure: ENDARTERECTOMY, CAROTID;  Surgeon: Jama Cordella MATSU, MD;  Location: [facility] ORS;  Service: Vascular;  Laterality: Left;   FRACTURE SURGERY     KNEE ARTHROPLASTY Left 05/14/2022   Procedure: COMPUTER ASSISTED TOTAL KNEE ARTHROPLASTY - RNFA;  Surgeon: Mardee Lynwood SQUIBB, MD;  Location: [facility] ORS;  Service: Orthopedics;  Laterality: Left;   LEFT HEART CATH AND CORONARY ANGIOGRAPHY N/A 12/06/2023   Procedure: LEFT HEART CATH AND CORONARY ANGIOGRAPHY;  Surgeon: Mady Bruckner, MD;  Location: [facility] INVASIVE CV [facility];  Service: Cardiovascular;  Laterality: N/A;   ORIF ELBOW FRACTURE Right 12/01/2017   Procedure: OPEN REDUCTION INTERNAL  FIXATION (ORIF) ELBOW/OLECRANON FRACTURE;  Surgeon: Kathlynn Sharper, MD;  Location: [facility] ORS;  Service: Orthopedics;  Laterality: Right;   OTHER SURGICAL HISTORY  2015   left arm surgery with titanium plate   Patient Active Problem List   Diagnosis Date Noted   Essential hypertension 12/21/2023   Abnormal stress test 12/06/2023   Fatigue 12/06/2023   Near syncope 09/27/2023   Carotid stenosis, asymptomatic 09/14/2023   Carotid stenosis 07/17/2023   Hyperlipidemia 07/17/2023   Arthropathy of lumbar facet joint 06/20/2023   Lumbar spondylosis 06/20/2023   Low back pain 06/20/2023   Nocturnal cough 01/03/2023   Orthostatic hypotension 11/22/2022   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   Osteoarthritis of left knee 03/16/2022   MCI (mild cognitive impairment) with memory loss 12/28/2021   Mood disorder 02/26/2019   Vertigo 04/11/2018   Preventative health care 02/22/2018   Advance directive discussed with patient 02/22/2018   Urge incontinence of urine    Dyslipidemia 02/25/2017   Increased frequency of urination 02/25/2017   Hallux valgus, acquired 09/10/2016   Closed fracture of lower end of humerus 11/10/2011   Pain in elbow 06/08/2011    ONSET DATE: > 6 months   REFERRING DIAG:  Diagnosis  R53.83 (ICD-10-CM) - Fatigue, unspecified type    THERAPY DIAG:  Physical deconditioning  Muscle weakness (generalized)  Rationale for Evaluation and Treatment: Rehabilitation  SUBJECTIVE:                                                                                                                                                                                             SUBJECTIVE STATEMENT:  Husband present at start of PT treatment.  Reports that she is still demonstrating excessive fatigue with the evenings worse than morning.  No other updates.    From Evaluation :   Reports hx of cardiac catheterization after stress test.   States that when she is walking, will  get some SOB after 1/4 of a mile. Husband states that she was walking >3 miles prior to cardiac issues.    Reports multiple bouts of near syncopal episodes within the last few weeks. Including once at PT at [facility] for 3-4 weeks for balance   Husband feels that Cardiac rehab may be more appropriate for management of endurance deficits.   Pt accompanied by: significant other  PERTINENT HISTORY:   From recent MD visit CAD Positive stress echo 11/24/2023.  LHC 12/06/2023 demonstrated moderate single-vessel CAD with recommendation for medical therapy.  Patient continues to have fatigue. Patient's husband inquired about cardiac rehab or physical therapy (see below). Discussed continuing to monitor symptoms and patient's husband is in agreement.  Right wrist cath site is well healed with very mild resolving ecchymosis and no erythema, edema, drainage or tenderness. 2+ radial pulse.  - Continue amlodipine , atorvastatin , aspirin .   Carotid artery stenosis S/p left carotid endarterectomy 09/14/2023.  Carotid ultrasound 09/28/2023 demonstrated no evidence of significant atherosclerotic changes or flow-limiting stenosis in the left carotid system, right ICA <50%. No further presyncopal or syncopal events.  - Continue aspirin , atorvastatin . - Continue to follow with vascular surgery.   Hypertension BP today 132/70.  - Continue amlodipine .   Presyncope 2 weeks ZIO 12/01/2023 demonstrated HR 48, 94 bpm, average 71 bpm, 24 runs of NSVT longest 21 beats.  Patient's husband denies further presyncopal events.  - Continue to monitor.    Fatigue Patient continues to experience increased fatigue per her husband. Discussed a component of deconditioning given the procedures and testing patient had over the last few months. Patient's husband requests referral for cardiac rehab or physical therapy. Referral will be placed for physical therapy at [facility]. Discussed close follow up to assess improvement.  -  Refer to physical therapy for aerobic/strength training.  PAIN:  Are you having pain? No  PRECAUTIONS: Fall and Other: near syncope   RED FLAGS: Bowel or bladder incontinence: Yes: bladder management    WEIGHT BEARING RESTRICTIONS: No  FALLS: Has patient fallen in last 6 months?  Yes. Number of falls 2 near syncopal episodes.   LIVING ENVIRONMENT: Lives with: lives with their spouse Lives in: House/apartment Stairs: No Has following equipment at home: None  PLOF: Independent, Independent with basic ADLs, Independent with household mobility without device, and Independent with community mobility without device  PATIENT GOALS: improve endurance.   OBJECTIVE:  Note: Objective measures were completed at Evaluation unless otherwise noted.  DIAGNOSTIC FINDINGS:   US  Carotid  IMPRESSION: 1. Right carotid system: Mild atherosclerotic changes in the proximal right internal carotid artery is estimated stenosis measuring less than 50%. 2. Left carotid system: No evidence of significant atherosclerotic change or flow-limiting stenosis. Within normal limits.   CARDIAC CATHETERIZATION [[id number]] Resulted: 12/06/23 1437  Order Status: Completed Updated: 12/07/23 0754  Narrative:    Conclusions: Moderate single-vessel coronary artery disease, with sequential 50-60% lesions in the mid LAD.  The LAD is quite tortuous suggestive of hypertensive heart disease.  No significant disease involving the LMCA, LCX, and RCA. Normal left ventricular systolic function (LVEF 55-65%) and filling pressure (LVEDP 15 mmHg)..    COGNITION: Overall cognitive status: History of cognitive impairments - at baseline   SENSATION: WFL  COORDINATION: A to K - WFL     POSTURE: rounded shoulders and forward head  LOWER EXTREMITY ROM:     WFL  LOWER EXTREMITY MMT:    MMT Right Eval Left Eval  Hip flexion 4+ 4+  Hip extension    Hip abduction 4+ 4+  Hip adduction 5 5  Hip internal rotation     Hip external rotation    Knee flexion 4+ 4+  Knee extension 5 5  Ankle dorsiflexion 5 5  Ankle plantarflexion 4 4  Ankle inversion    Ankle eversion    (Blank rows = not tested)  BED MOBILITY:  Not tested  TRANSFERS: Sit to stand: Complete Independence  Assistive device utilized: None     Stand to sit: Complete Independence  Assistive device utilized: None     Chair to chair: Complete Independence  Assistive device utilized: None       RAMP:  Not tested  CURB:  Findings: Mod I wit self selected UE support on rail   STAIRS: Findings: Level of Assistance: Modified independence, Number of Stairs: 4, Height of Stairs: 4   , and Comments: light UE support  GAIT: Findings: Gait Characteristics: decreased trunk rotation, Distance walked: 1546ft, Assistive device utilized:None, Level of assistance: Complete Independence, and Comments: reports mild SOB 2/10 in 6 min walk test.   FUNCTIONAL TESTS:  5 times sit to stand: 10.16  30 seconds chair stand test 16.5 HR increased to 105 decreased to 78 within 45 sec rest  6 minute walk test: 1524ft  PATIENT SURVEYS:                                                                                                                                TREATMENT DATE: 01/31/2024 PT  instructed pt in TA to address activity intolerance, improve endurance, and increase independence with access to community.   Nustep.  5 min level 4-8; HR 90bmp; SpO2 99% Borg RPE 12; 2 min rest break, 5 min, level 4-8 HR 103 SpO2 99% Borg RPE 13.  2 min rest break HR re-assessed: 66bpm SpO2 99%  Amb without resistance x 8:73min x 2261ft. HR 96 SpO2 99%. Rates SPB 2/10 and Borg RPE 11. Was noted to be able to whistle or maintain conversation throughout entire Amb training, but slightly breathy upon completion with conversation. Pt then provided with 3 min rest break.  Was noted to have slight soreness in the L knee after 5 min. Also noted to have decreased knee  extension, reduced hip extension and pelvic rotation, as well as reduced DF on the LLE resulting in occasional foot drag   Therex to improve strengthen BLE to allow improved gait pattern and reduce fall risk.  Seated DF/hip flexion with YTB 2 x 15  Standing hip extension YTB 2 x 12  Leg press x 12 25#. Rates as easy.   HEP provided as listed below.   PATIENT EDUCATION: Education details: POC. Benefits of skilled PT to address endurance and strengthen deficits; will benefit with transition of care to personal training or self directed fitness program to return to PLOF within several weeks  Person educated: Patient and Spouse Education method: Explanation and Demonstration Education comprehension: verbalized understanding and returned demonstration  HOME EXERCISE PROGRAM:  Access Code: [email or web address] URL: [email or web address] Date: 01/31/2024 Prepared by: Massie Dollar  Exercises - Seated March With Resistance at Feet  - 1 x daily - 5 x weekly - 3 sets - 10 reps - 2 seconds  hold - Standing Hip Extension with Resistance at Ankles and Counter Support  - 1 x daily - 5 x weekly - 3 sets - 10 reps - 2 seconds hold - Sit to Stand with Arms Crossed  - 1 x daily - 5 x weekly - 3 sets - 10 reps   GOALS: Goals reviewed with patient? Yes   SHORT TERM GOALS: Target date: 02/23/2024    Patient will be independent in home exercise program to improve strength/mobility for better functional independence with ADLs. Baseline: to be initiated at visit 2.  Goal status: INITIAL   LONG TERM GOALS: Target date: 03/01/2024    1.  Patient will increase 30 sec sit to stand by >2 full repetitions indicating an increased LE strength and improved balance. Baseline: 16.5 Goal status: INITIAL  2.  Patient will increase 6 min walk to at least 6 aged matched norm indicating improved function and tolerance to increased community activity  Baseline: 1583ft Goal status: INITIAL  4.  Patient  will ascend a flight of stairs without pain and SOB only 2/10 to indicate improved access to community and family events.  Baseline: to be assessed Goal status: INITIAL    ASSESSMENT:  CLINICAL IMPRESSION: Patient is a 78 y.o. female who was seen today for physical therapy treatment for deconditioning and fatigue due to CAD and carotid artery stenosis. Husband continues to report excessive fatigue which is only mildly noted in today's session with pt tolerating> 2035ft with SOB only rated 2/10. Will continue to address endurance and activity tolerance deficits with expectations to transition to self paced endurance program within the next few visits.  Pt will benefit from Skilled PT treatment to address endurance and strength deficits to improve function and allow return to PLOF and improve  overall QoL.   OBJECTIVE IMPAIRMENTS: Abnormal gait, cardiopulmonary status limiting activity, decreased activity tolerance, decreased cognition, decreased coordination, decreased endurance, decreased knowledge of condition, decreased knowledge of use of DME, decreased mobility, difficulty walking, decreased ROM, and decreased strength.   ACTIVITY LIMITATIONS: stairs and locomotion level  PARTICIPATION LIMITATIONS: shopping, community activity, and church  PERSONAL FACTORS: 1-2 comorbidities: CAD, HTN are also affecting patient's functional outcome.   REHAB POTENTIAL: Excellent  CLINICAL DECISION MAKING: Stable/uncomplicated  EVALUATION COMPLEXITY: Low  PLAN:  PT FREQUENCY: 1-2x/week  PT DURATION: other: 5 weeks   PLANNED INTERVENTIONS: [id number]- PT Re-evaluation, 97750- Physical Performance Testing, 97110-Therapeutic exercises, 97530- Therapeutic activity, [id number]- Neuromuscular re-education, 97535- Self Care, [id number]- Manual therapy, [phone]- Gait training, [phone]- Aquatic Therapy, Patient/Family education, Balance training, Stair training, Cognitive remediation, Cryotherapy, and Moist heat  PLAN FOR  NEXT SESSION:   Continue activity tolerance training with and without weighted ambulation and nustep. Modify HEP to address ROm deficits as noted.   Massie FORBES Dollar, PT 01/31/2024, 8:11 AM

## 2024-02-02 ENCOUNTER — Ambulatory Visit: Admitting: Physical Therapy

## 2024-02-02 DIAGNOSIS — R5381 Other malaise: Secondary | ICD-10-CM

## 2024-02-02 DIAGNOSIS — R5383 Other fatigue: Secondary | ICD-10-CM | POA: Diagnosis not present

## 2024-02-02 DIAGNOSIS — M6281 Muscle weakness (generalized): Secondary | ICD-10-CM | POA: Diagnosis not present

## 2024-02-02 NOTE — Therapy (Signed)
 OUTPATIENT PHYSICAL THERAPY NEURO Treatment,    Patient Name: Julia Snyder MRN: [id number] DOB:04-03-1945, 78 y.o., female Today's Date: 02/02/2024   PCP:    Laurence Locus, DO   REFERRING PROVIDER: Loistine Sober, NP   END OF SESSION:  PT End of Session - 02/02/24 0801     Visit Number 3    Number of Visits 10    Date for Recertification  03/01/24    PT Start Time 0801    PT Stop Time 0845    PT Time Calculation (min) 44 min    Equipment Utilized During Treatment Gait belt    Activity Tolerance Patient tolerated treatment well    Behavior During Therapy [facility] for tasks assessed/performed          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   Orthostatic hypotension 11/22/2022   Overactive bladder    Urge incontinence of urine    Vertigo 04/11/2018   Past Surgical History:  Procedure Laterality Date   BREAST BIOPSY Left 2004   benign   BUNIONECTOMY Bilateral    left ~2014, right 2018   COLONOSCOPY     ELBOW FRACTURE SURGERY Bilateral    right 2019, left ~2015   ENDARTERECTOMY Left 09/14/2023   Procedure: ENDARTERECTOMY, CAROTID;  Surgeon: Jama Cordella MATSU, MD;  Location: [facility] ORS;  Service: Vascular;  Laterality: Left;   FRACTURE SURGERY     KNEE ARTHROPLASTY Left 05/14/2022   Procedure: COMPUTER ASSISTED TOTAL KNEE ARTHROPLASTY - RNFA;  Surgeon: Mardee Lynwood SQUIBB, MD;  Location: [facility] ORS;  Service: Orthopedics;  Laterality: Left;   LEFT HEART CATH AND CORONARY ANGIOGRAPHY N/A 12/06/2023   Procedure: LEFT HEART CATH AND CORONARY ANGIOGRAPHY;  Surgeon: Mady Bruckner, MD;  Location: [facility] INVASIVE CV LAB;  Service: Cardiovascular;  Laterality: N/A;   ORIF ELBOW FRACTURE Right 12/01/2017   Procedure: OPEN REDUCTION INTERNAL  FIXATION (ORIF) ELBOW/OLECRANON FRACTURE;  Surgeon: Kathlynn Sharper, MD;  Location: [facility] ORS;  Service: Orthopedics;  Laterality: Right;   OTHER SURGICAL HISTORY  2015   left arm surgery with titanium plate   Patient Active Problem List   Diagnosis Date Noted   Essential hypertension 12/21/2023   Abnormal stress test 12/06/2023   Fatigue 12/06/2023   Near syncope 09/27/2023   Carotid stenosis, asymptomatic 09/14/2023   Carotid stenosis 07/17/2023   Hyperlipidemia 07/17/2023   Arthropathy of lumbar facet joint 06/20/2023   Lumbar spondylosis 06/20/2023   Low back pain 06/20/2023   Nocturnal cough 01/03/2023   Orthostatic hypotension 11/22/2022   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   Osteoarthritis of left knee 03/16/2022   MCI (mild cognitive impairment) with memory loss 12/28/2021   Mood disorder 02/26/2019   Vertigo 04/11/2018   Preventative health care 02/22/2018   Advance directive discussed with patient 02/22/2018   Urge incontinence of urine    Dyslipidemia 02/25/2017   Increased frequency of urination 02/25/2017   Hallux valgus, acquired 09/10/2016   Closed fracture of lower end of humerus 11/10/2011   Pain in elbow 06/08/2011    ONSET DATE: > 6 months   REFERRING DIAG:  Diagnosis  R53.83 (ICD-10-CM) - Fatigue, unspecified type    THERAPY DIAG:  Physical deconditioning  Muscle weakness (generalized)  Rationale for Evaluation and Treatment:  Rehabilitation  SUBJECTIVE:                                                                                                                                                                                             SUBJECTIVE STATEMENT:  Husband present  throughout PT treatment.  Pt states that she doesn't remember well, so she is unsure of how her day went yesterday, but was able to recall a close neighbor had been diagnosed with brain CA.  No other updates.    From Evaluation :   Reports hx of cardiac  catheterization after stress test.   States that when she is walking, will get some SOB after 1/4 of a mile. Husband states that she was walking >3 miles prior to cardiac issues.    Reports multiple bouts of near syncopal episodes within the last few weeks. Including once at PT at [facility] for 3-4 weeks for balance   Husband feels that Cardiac rehab may be more appropriate for management of endurance deficits.   Pt accompanied by: significant other  PERTINENT HISTORY:   From recent MD visit CAD Positive stress echo 11/24/2023.  LHC 12/06/2023 demonstrated moderate single-vessel CAD with recommendation for medical therapy.  Patient continues to have fatigue. Patient's husband inquired about cardiac rehab or physical therapy (see below). Discussed continuing to monitor symptoms and patient's husband is in agreement.  Right wrist cath site is well healed with very mild resolving ecchymosis and no erythema, edema, drainage or tenderness. 2+ radial pulse.  - Continue amlodipine , atorvastatin , aspirin .   Carotid artery stenosis S/p left carotid endarterectomy 09/14/2023.  Carotid ultrasound 09/28/2023 demonstrated no evidence of significant atherosclerotic changes or flow-limiting stenosis in the left carotid system, right ICA <50%. No further presyncopal or syncopal events.  - Continue aspirin , atorvastatin . - Continue to follow with vascular surgery.   Hypertension BP today 132/70.  - Continue amlodipine .   Presyncope 2 weeks ZIO 12/01/2023 demonstrated HR 48, 94 bpm, average 71 bpm, 24 runs of NSVT longest 21 beats.  Patient's husband denies further presyncopal events.  - Continue to monitor.    Fatigue Patient continues to experience increased fatigue per her husband. Discussed a component of deconditioning given the procedures and testing patient had over the last few months. Patient's husband requests referral for cardiac rehab or physical therapy. Referral will be placed for physical  therapy at [facility]. Discussed close follow up to assess improvement.  - Refer to physical therapy for aerobic/strength training.  PAIN:  Are you having pain? No  PRECAUTIONS: Fall and Other: near syncope   RED FLAGS: Bowel or bladder incontinence: Yes:  bladder management    WEIGHT BEARING RESTRICTIONS: No  FALLS: Has patient fallen in last 6 months? Yes. Number of falls 2 near syncopal episodes.   LIVING ENVIRONMENT: Lives with: lives with their spouse Lives in: House/apartment Stairs: No Has following equipment at home: None  PLOF: Independent, Independent with basic ADLs, Independent with household mobility without device, and Independent with community mobility without device  PATIENT GOALS: improve endurance.   OBJECTIVE:  Note: Objective measures were completed at Evaluation unless otherwise noted.  DIAGNOSTIC FINDINGS:   US  Carotid  IMPRESSION: 1. Right carotid system: Mild atherosclerotic changes in the proximal right internal carotid artery is estimated stenosis measuring less than 50%. 2. Left carotid system: No evidence of significant atherosclerotic change or flow-limiting stenosis. Within normal limits.   CARDIAC CATHETERIZATION [[id number]] Resulted: 12/06/23 1437  Order Status: Completed Updated: 12/07/23 0754  Narrative:    Conclusions: Moderate single-vessel coronary artery disease, with sequential 50-60% lesions in the mid LAD.  The LAD is quite tortuous suggestive of hypertensive heart disease.  No significant disease involving the LMCA, LCX, and RCA. Normal left ventricular systolic function (LVEF 55-65%) and filling pressure (LVEDP 15 mmHg)..    COGNITION: Overall cognitive status: History of cognitive impairments - at baseline   SENSATION: WFL  COORDINATION: A to K - WFL     POSTURE: rounded shoulders and forward head  LOWER EXTREMITY ROM:     WFL  LOWER EXTREMITY MMT:    MMT Right Eval Left Eval  Hip flexion 4+ 4+  Hip  extension    Hip abduction 4+ 4+  Hip adduction 5 5  Hip internal rotation    Hip external rotation    Knee flexion 4+ 4+  Knee extension 5 5  Ankle dorsiflexion 5 5  Ankle plantarflexion 4 4  Ankle inversion    Ankle eversion    (Blank rows = not tested)  BED MOBILITY:  Not tested  TRANSFERS: Sit to stand: Complete Independence  Assistive device utilized: None     Stand to sit: Complete Independence  Assistive device utilized: None     Chair to chair: Complete Independence  Assistive device utilized: None       RAMP:  Not tested  CURB:  Findings: Mod I wit self selected UE support on rail   STAIRS: Findings: Level of Assistance: Modified independence, Number of Stairs: 4, Height of Stairs: 4   , and Comments: light UE support  GAIT: Findings: Gait Characteristics: decreased trunk rotation, Distance walked: 1567ft, Assistive device utilized:None, Level of assistance: Complete Independence, and Comments: reports mild SOB 2/10 in 6 min walk test.   FUNCTIONAL TESTS:  5 times sit to stand: 10.16  30 seconds chair stand test 16.5 HR increased to 105 decreased to 78 within 45 sec rest  6 minute walk test: 1557ft  PATIENT SURVEYS:  TREATMENT DATE: 02/02/2024 PT instructed pt in TA to address activity intolerance, improve endurance, and increase independence with access to community.   Nustep.  5 min level 4; HR 82bmp; SpO2 99% 93/75(81) Borg RPE 15-16; 2 min rest break, 105/56(72) HR 76 Borg RPE 8  5 min, level 3-4; 130/63(82) HR 86.  Borg RPE 16.  2 min rest break; 104/61(74)  81 Was noted to have increased fatigue on Nustep compared to prior PT treatment with pt noted to appear to nod off after second bout.   Gait without resistance x 7 min RPE12-13. 125/60(79) HR 85 2 min rest break 117/62(77) HR 79 Gait with 3# AW x 5 min RPE 13-14.  134/54(78) HR 88 2 min rest break RPE 8. 105   Education for husband to encourage pt to perform walking program with 10 mi  followed by 3 min rest then 5 min walking then repeated at least once prior to next PT treatment. Also encouraged husband to allow pt to maintain preferred gait speed for full 10 min, rather than pushing it  PATIENT EDUCATION:  Education details: POC. Benefits of skilled PT to address endurance and strengthen deficits; will benefit with transition of care to personal training or self directed fitness program to return to PLOF within several weeks   Initiation of home based walking program.   Person educated: Patient and Spouse Education method: Explanation and Demonstration Education comprehension: verbalized understanding and returned demonstration  HOME EXERCISE PROGRAM:  Access Code: [email or web address] URL: [email or web address] Date: 01/31/2024 Prepared by: Massie Dollar  Exercises - Seated March With Resistance at Feet  - 1 x daily - 5 x weekly - 3 sets - 10 reps - 2 seconds  hold - Standing Hip Extension with Resistance at Ankles and Counter Support  - 1 x daily - 5 x weekly - 3 sets - 10 reps - 2 seconds hold - Sit to Stand with Arms Crossed  - 1 x daily - 5 x weekly - 3 sets - 10 reps   GOALS: Goals reviewed with patient? Yes   SHORT TERM GOALS: Target date: 02/23/2024    Patient will be independent in home exercise program to improve strength/mobility for better functional independence with ADLs. Baseline: to be initiated at visit 2.  Goal status: INITIAL   LONG TERM GOALS: Target date: 03/01/2024    1.  Patient will increase 30 sec sit to stand by >2 full repetitions indicating an increased LE strength and improved balance. Baseline: 16.5 Goal status: INITIAL  2.  Patient will increase 6 min walk to at least 6 aged matched norm indicating improved function and tolerance to increased community activity  Baseline: 1548ft Goal  status: INITIAL  4.  Patient will ascend a flight of stairs without pain and SOB only 2/10 to indicate improved access to community and family events.  Baseline: to be assessed Goal status: INITIAL    ASSESSMENT:  CLINICAL IMPRESSION: Patient is a 78 y.o. female who was seen today for physical therapy treatment for deconditioning and fatigue due to CAD and carotid artery stenosis. Pt demonstrates mild fatigue on this day with nustep training and inconsistent BP reading with noted drop in BP with first bout of seated endurance training 93/75(81) then increased to 105/56(72). Throughout remainder session pt demonstrates appropriate BP response to activity with increased BP with activity to returned to baseline with rest. Husband instructed in initiation of walking program with walk/rest to allow recovery from mild SOB.  Pt will benefit from  Skilled PT treatment to address endurance and strength deficits to improve function and allow return to PLOF and improve overall QoL.   OBJECTIVE IMPAIRMENTS: Abnormal gait, cardiopulmonary status limiting activity, decreased activity tolerance, decreased cognition, decreased coordination, decreased endurance, decreased knowledge of condition, decreased knowledge of use of DME, decreased mobility, difficulty walking, decreased ROM, and decreased strength.   ACTIVITY LIMITATIONS: stairs and locomotion level  PARTICIPATION LIMITATIONS: shopping, community activity, and church  PERSONAL FACTORS: 1-2 comorbidities: CAD, HTN are also affecting patient's functional outcome.   REHAB POTENTIAL: Excellent  CLINICAL DECISION MAKING: Stable/uncomplicated  EVALUATION COMPLEXITY: Low  PLAN:  PT FREQUENCY: 1-2x/week  PT DURATION: other: 5 weeks   PLANNED INTERVENTIONS: [id number]- PT Re-evaluation, 97750- Physical Performance Testing, 97110-Therapeutic exercises, 97530- Therapeutic activity, [id number]- Neuromuscular re-education, 97535- Self Care, [id number]- Manual therapy,  [phone]- Gait training, [id number]- Aquatic Therapy, Patient/Family education, Balance training, Stair training, Cognitive remediation, Cryotherapy, and Moist heat  PLAN FOR NEXT SESSION:   Continue activity tolerance training with and without weighted ambulation and nustep. Modify HEP to address ROm deficits as noted.   Massie FORBES Dollar, PT 02/02/2024, 8:02 AM

## 2024-02-03 ENCOUNTER — Encounter: Admitting: Internal Medicine

## 2024-02-06 ENCOUNTER — Ambulatory Visit: Admitting: Physical Therapy

## 2024-02-06 DIAGNOSIS — R5383 Other fatigue: Secondary | ICD-10-CM | POA: Diagnosis not present

## 2024-02-06 DIAGNOSIS — R5381 Other malaise: Secondary | ICD-10-CM

## 2024-02-06 DIAGNOSIS — M6281 Muscle weakness (generalized): Secondary | ICD-10-CM

## 2024-02-06 DIAGNOSIS — R41841 Cognitive communication deficit: Secondary | ICD-10-CM | POA: Diagnosis not present

## 2024-02-06 NOTE — Therapy (Signed)
 OUTPATIENT PHYSICAL THERAPY NEURO Treatment,    Patient Name: Julia Snyder MRN: [id number] DOB:February 28, 1946, 78 y.o., female Today's Date: 02/06/2024   PCP:    Laurence Locus, DO   REFERRING PROVIDER: Loistine Sober, NP   END OF SESSION:  PT End of Session - 02/06/24 0934     Visit Number 4    Number of Visits 10    Date for Recertification  03/01/24    PT Start Time 0933    PT Stop Time 1015    PT Time Calculation (min) 42 min    Equipment Utilized During Treatment Gait belt    Activity Tolerance Patient tolerated treatment well    Behavior During Therapy [facility] for tasks assessed/performed          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   Orthostatic hypotension 11/22/2022   Overactive bladder    Urge incontinence of urine    Vertigo 04/11/2018   Past Surgical History:  Procedure Laterality Date   BREAST BIOPSY Left 2004   benign   BUNIONECTOMY Bilateral    left ~2014, right 2018   COLONOSCOPY     ELBOW FRACTURE SURGERY Bilateral    right 2019, left ~2015   ENDARTERECTOMY Left 09/14/2023   Procedure: ENDARTERECTOMY, CAROTID;  Surgeon: Jama Cordella MATSU, MD;  Location: [facility] ORS;  Service: Vascular;  Laterality: Left;   FRACTURE SURGERY     KNEE ARTHROPLASTY Left 05/14/2022   Procedure: COMPUTER ASSISTED TOTAL KNEE ARTHROPLASTY - RNFA;  Surgeon: Mardee Lynwood SQUIBB, MD;  Location: [facility] ORS;  Service: Orthopedics;  Laterality: Left;   LEFT HEART CATH AND CORONARY ANGIOGRAPHY N/A 12/06/2023   Procedure: LEFT HEART CATH AND CORONARY ANGIOGRAPHY;  Surgeon: Mady Bruckner, MD;  Location: [facility] INVASIVE CV LAB;  Service: Cardiovascular;  Laterality: N/A;   ORIF ELBOW FRACTURE Right 12/01/2017   Procedure: OPEN REDUCTION INTERNAL  FIXATION (ORIF) ELBOW/OLECRANON FRACTURE;  Surgeon: Kathlynn Sharper, MD;  Location: [facility] ORS;  Service: Orthopedics;  Laterality: Right;   OTHER SURGICAL HISTORY  2015   left arm surgery with titanium plate   Patient Active Problem List   Diagnosis Date Noted   Essential hypertension 12/21/2023   Abnormal stress test 12/06/2023   Fatigue 12/06/2023   Near syncope 09/27/2023   Carotid stenosis, asymptomatic 09/14/2023   Carotid stenosis 07/17/2023   Hyperlipidemia 07/17/2023   Arthropathy of lumbar facet joint 06/20/2023   Lumbar spondylosis 06/20/2023   Low back pain 06/20/2023   Nocturnal cough 01/03/2023   Orthostatic hypotension 11/22/2022   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   Osteoarthritis of left knee 03/16/2022   MCI (mild cognitive impairment) with memory loss 12/28/2021   Mood disorder 02/26/2019   Vertigo 04/11/2018   Preventative health care 02/22/2018   Advance directive discussed with patient 02/22/2018   Urge incontinence of urine    Dyslipidemia 02/25/2017   Increased frequency of urination 02/25/2017   Hallux valgus, acquired 09/10/2016   Closed fracture of lower end of humerus 11/10/2011   Pain in elbow 06/08/2011    ONSET DATE: > 6 months   REFERRING DIAG:  Diagnosis  R53.83 (ICD-10-CM) - Fatigue, unspecified type    THERAPY DIAG:  Physical deconditioning  Muscle weakness (generalized)  Rationale for Evaluation and Treatment:  Rehabilitation  SUBJECTIVE:                                                                                                                                                                                             SUBJECTIVE STATEMENT:  Husband present  throughout PT treatment.   Pt states that she is doing well. Reports that she has a little soreness in the L knee. Will be seeing Orthopedic MD early 2026 for 2 year follow up.      From Evaluation :   Reports hx of cardiac catheterization after stress test.    States that when she is walking, will get some SOB after 1/4 of a mile. Husband states that she was walking >3 miles prior to cardiac issues.    Reports multiple bouts of near syncopal episodes within the last few weeks. Including once at PT at [facility] for 3-4 weeks for balance   Husband feels that Cardiac rehab may be more appropriate for management of endurance deficits.   Pt accompanied by: significant other  PERTINENT HISTORY:   From recent MD visit CAD Positive stress echo 11/24/2023.  LHC 12/06/2023 demonstrated moderate single-vessel CAD with recommendation for medical therapy.  Patient continues to have fatigue. Patient's husband inquired about cardiac rehab or physical therapy (see below). Discussed continuing to monitor symptoms and patient's husband is in agreement.  Right wrist cath site is well healed with very mild resolving ecchymosis and no erythema, edema, drainage or tenderness. 2+ radial pulse.  - Continue amlodipine , atorvastatin , aspirin .   Carotid artery stenosis S/p left carotid endarterectomy 09/14/2023.  Carotid ultrasound 09/28/2023 demonstrated no evidence of significant atherosclerotic changes or flow-limiting stenosis in the left carotid system, right ICA <50%. No further presyncopal or syncopal events.  - Continue aspirin , atorvastatin . - Continue to follow with vascular surgery.   Hypertension BP today 132/70.  - Continue amlodipine .   Presyncope 2 weeks ZIO 12/01/2023 demonstrated HR 48, 94 bpm, average 71 bpm, 24 runs of NSVT longest 21 beats.  Patient's husband denies further presyncopal events.  - Continue to monitor.    Fatigue Patient continues to experience increased fatigue per her husband. Discussed a component of deconditioning given the procedures and testing patient had over the last few months. Patient's husband requests referral for cardiac rehab or physical therapy. Referral will be placed for physical therapy at [facility]. Discussed  close follow up to assess improvement.  - Refer to physical therapy for aerobic/strength training.  PAIN:  Are you having pain? No  PRECAUTIONS: Fall and Other: near syncope   RED FLAGS: Bowel or bladder incontinence: Yes: bladder management  WEIGHT BEARING RESTRICTIONS: No  FALLS: Has patient fallen in last 6 months? Yes. Number of falls 2 near syncopal episodes.   LIVING ENVIRONMENT: Lives with: lives with their spouse Lives in: House/apartment Stairs: No Has following equipment at home: None  PLOF: Independent, Independent with basic ADLs, Independent with household mobility without device, and Independent with community mobility without device  PATIENT GOALS: improve endurance.   OBJECTIVE:  Note: Objective measures were completed at Evaluation unless otherwise noted.  DIAGNOSTIC FINDINGS:   US  Carotid  IMPRESSION: 1. Right carotid system: Mild atherosclerotic changes in the proximal right internal carotid artery is estimated stenosis measuring less than 50%. 2. Left carotid system: No evidence of significant atherosclerotic change or flow-limiting stenosis. Within normal limits.   CARDIAC CATHETERIZATION [[id number]] Resulted: 12/06/23 1437  Order Status: Completed Updated: 12/07/23 0754  Narrative:    Conclusions: Moderate single-vessel coronary artery disease, with sequential 50-60% lesions in the mid LAD.  The LAD is quite tortuous suggestive of hypertensive heart disease.  No significant disease involving the LMCA, LCX, and RCA. Normal left ventricular systolic function (LVEF 55-65%) and filling pressure (LVEDP 15 mmHg)..    COGNITION: Overall cognitive status: History of cognitive impairments - at baseline   SENSATION: WFL  COORDINATION: A to K - WFL     POSTURE: rounded shoulders and forward head  LOWER EXTREMITY ROM:     WFL  LOWER EXTREMITY MMT:    MMT Right Eval Left Eval  Hip flexion 4+ 4+  Hip extension    Hip abduction 4+ 4+   Hip adduction 5 5  Hip internal rotation    Hip external rotation    Knee flexion 4+ 4+  Knee extension 5 5  Ankle dorsiflexion 5 5  Ankle plantarflexion 4 4  Ankle inversion    Ankle eversion    (Blank rows = not tested)  BED MOBILITY:  Not tested  TRANSFERS: Sit to stand: Complete Independence  Assistive device utilized: None     Stand to sit: Complete Independence  Assistive device utilized: None     Chair to chair: Complete Independence  Assistive device utilized: None       RAMP:  Not tested  CURB:  Findings: Mod I wit self selected UE support on rail   STAIRS: Findings: Level of Assistance: Modified independence, Number of Stairs: 4, Height of Stairs: 4   , and Comments: light UE support  GAIT: Findings: Gait Characteristics: decreased trunk rotation, Distance walked: 1525ft, Assistive device utilized:None, Level of assistance: Complete Independence, and Comments: reports mild SOB 2/10 in 6 min walk test.   FUNCTIONAL TESTS:  5 times sit to stand: 10.16  30 seconds chair stand test 16.5 HR increased to 105 decreased to 78 within 45 sec rest  6 minute walk test: 1511ft  PATIENT SURVEYS:  TREATMENT DATE: 02/06/2024 PT instructed pt in TE and TA to address activity intolerance, improve endurance, and increase strength for function mobility and increase community mobility   VS assess at start of PT treatment.  Sitting: 107/60 HR 80 Standing: 109/53 HR 81  Weighted ambulation with 3# AW. X 5 min x 3 bouts. Pt able to tolerated >800 on each bout. Only Mild SOB after first bout, but was noted to have reduced fatigue on second and third bout.   HR SpO2 assessed every min; HR increased from 90 to 115 upon completion and SpO2 97-99% for first bout. Since no significant fatigue or SOB noted on first bout, VS not assessed during   VS assessed upon  after first bout  113/56 HR 93  VS completed after the second bout.  117/45 HR 88 VS completed after third bout  110/66 HR 117.    Following each bout of weighted gait, Pt allowed 1 min rest break then perform Circuit with 3# AW in place:   sit<>stand x 10 LAQ x 12  Hip flexion x 15  Standing heel/toe raise x 15  30 sec rest break between interventions    PATIENT EDUCATION:  Education details: POC. Benefits of skilled PT to address endurance and strengthen deficits; will benefit with transition of care to personal training or self directed fitness program to return to PLOF within several weeks   Review of walking program and HEP review.    Person educated: Patient and Spouse Education method: Explanation and Demonstration Education comprehension: verbalized understanding and returned demonstration  HOME EXERCISE PROGRAM:  Access Code: [email or web address] URL: [email or web address] Date: 01/31/2024 Prepared by: Massie Dollar  Exercises - Seated March With Resistance at Feet  - 1 x daily - 5 x weekly - 3 sets - 10 reps - 2 seconds  hold - Standing Hip Extension with Resistance at Ankles and Counter Support  - 1 x daily - 5 x weekly - 3 sets - 10 reps - 2 seconds hold - Sit to Stand with Arms Crossed  - 1 x daily - 5 x weekly - 3 sets - 10 reps   GOALS: Goals reviewed with patient? Yes   SHORT TERM GOALS: Target date: 02/23/2024    Patient will be independent in home exercise program to improve strength/mobility for better functional independence with ADLs. Baseline: to be initiated at visit 2.  Goal status: INITIAL   LONG TERM GOALS: Target date: 03/01/2024    1.  Patient will increase 30 sec sit to stand by >2 full repetitions indicating an increased LE strength and improved balance. Baseline: 16.5 Goal status: INITIAL  2.  Patient will increase 6 min walk to at least 6 aged matched norm indicating improved function and tolerance to increased community  activity  Baseline: 1593ft Goal status: INITIAL  4.  Patient will ascend a flight of stairs without pain and SOB only 2/10 to indicate improved access to community and family events.  Baseline: to be assessed Goal status: INITIAL    ASSESSMENT:  CLINICAL IMPRESSION: Patient is a 78 y.o. female who was seen today for physical therapy treatment for deconditioning and fatigue due to CAD and carotid artery stenosis. PT instructed pt in circuit training with weighted gait and TE to address strength and balance deficits. Tolerated well only mild SOB or fatigue noted. Reviewed HEP and walking program instructions for remainder of week.  Pt will benefit from Skilled PT treatment to address endurance and strength deficits to improve function and  allow return to PLOF and improve overall QoL.   OBJECTIVE IMPAIRMENTS: Abnormal gait, cardiopulmonary status limiting activity, decreased activity tolerance, decreased cognition, decreased coordination, decreased endurance, decreased knowledge of condition, decreased knowledge of use of DME, decreased mobility, difficulty walking, decreased ROM, and decreased strength.   ACTIVITY LIMITATIONS: stairs and locomotion level  PARTICIPATION LIMITATIONS: shopping, community activity, and church  PERSONAL FACTORS: 1-2 comorbidities: CAD, HTN are also affecting patient's functional outcome.   REHAB POTENTIAL: Excellent  CLINICAL DECISION MAKING: Stable/uncomplicated  EVALUATION COMPLEXITY: Low  PLAN:  PT FREQUENCY: 1-2x/week  PT DURATION: other: 5 weeks   PLANNED INTERVENTIONS: [id number]- PT Re-evaluation, 97750- Physical Performance Testing, 97110-Therapeutic exercises, 97530- Therapeutic activity, [id number]- Neuromuscular re-education, 97535- Self Care, [id number]- Manual therapy, [phone]- Gait training, [phone]- Aquatic Therapy, Patient/Family education, Balance training, Stair training, Cognitive remediation, Cryotherapy, and Moist heat  PLAN FOR NEXT SESSION:    Continue activity tolerance training with and without weighted ambulation and nustep. Modify HEP to address ROm deficits as noted.   Massie FORBES Dollar, PT 02/06/2024, 9:35 AM

## 2024-02-13 ENCOUNTER — Ambulatory Visit

## 2024-02-13 DIAGNOSIS — R41841 Cognitive communication deficit: Secondary | ICD-10-CM | POA: Diagnosis not present

## 2024-02-15 ENCOUNTER — Ambulatory Visit: Admitting: Physical Therapy

## 2024-02-15 DIAGNOSIS — M6281 Muscle weakness (generalized): Secondary | ICD-10-CM | POA: Insufficient documentation

## 2024-02-15 DIAGNOSIS — R5381 Other malaise: Secondary | ICD-10-CM | POA: Insufficient documentation

## 2024-02-15 NOTE — Therapy (Signed)
 OUTPATIENT PHYSICAL THERAPY NEURO Treatment,    Patient Name: Julia Snyder MRN: [id number] DOB:September 30, 1945, 78 y.o., female Today's Date: 02/15/2024   PCP:    Laurence Locus, DO   REFERRING PROVIDER: Loistine Sober, NP   END OF SESSION:  PT End of Session - 02/15/24 0759     Visit Number 5    Number of Visits 10    Date for Recertification  03/01/24    PT Start Time 0803    PT Stop Time 0845    PT Time Calculation (min) 42 min    Equipment Utilized During Treatment Gait belt    Activity Tolerance Patient tolerated treatment well    Behavior During Therapy [facility] for tasks assessed/performed          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   Orthostatic hypotension 11/22/2022   Overactive bladder    Urge incontinence of urine    Vertigo 04/11/2018   Past Surgical History:  Procedure Laterality Date   BREAST BIOPSY Left 2004   benign   BUNIONECTOMY Bilateral    left ~2014, right 2018   COLONOSCOPY     ELBOW FRACTURE SURGERY Bilateral    right 2019, left ~2015   ENDARTERECTOMY Left 09/14/2023   Procedure: ENDARTERECTOMY, CAROTID;  Surgeon: Jama Cordella MATSU, MD;  Location: [facility] ORS;  Service: Vascular;  Laterality: Left;   FRACTURE SURGERY     KNEE ARTHROPLASTY Left 05/14/2022   Procedure: COMPUTER ASSISTED TOTAL KNEE ARTHROPLASTY - RNFA;  Surgeon: Mardee Lynwood SQUIBB, MD;  Location: [facility] ORS;  Service: Orthopedics;  Laterality: Left;   LEFT HEART CATH AND CORONARY ANGIOGRAPHY N/A 12/06/2023   Procedure: LEFT HEART CATH AND CORONARY ANGIOGRAPHY;  Surgeon: Mady Bruckner, MD;  Location: [facility] INVASIVE CV LAB;  Service: Cardiovascular;  Laterality: N/A;   ORIF ELBOW FRACTURE Right 12/01/2017   Procedure: OPEN REDUCTION INTERNAL  FIXATION (ORIF) ELBOW/OLECRANON FRACTURE;  Surgeon: Kathlynn Sharper, MD;  Location: [facility] ORS;  Service: Orthopedics;  Laterality: Right;   OTHER SURGICAL HISTORY  2015   left arm surgery with titanium plate   Patient Active Problem List   Diagnosis Date Noted   Essential hypertension 12/21/2023   Abnormal stress test 12/06/2023   Fatigue 12/06/2023   Near syncope 09/27/2023   Carotid stenosis, asymptomatic 09/14/2023   Carotid stenosis 07/17/2023   Hyperlipidemia 07/17/2023   Arthropathy of lumbar facet joint 06/20/2023   Lumbar spondylosis 06/20/2023   Low back pain 06/20/2023   Nocturnal cough 01/03/2023   Orthostatic hypotension 11/22/2022   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   Osteoarthritis of left knee 03/16/2022   MCI (mild cognitive impairment) with memory loss 12/28/2021   Mood disorder 02/26/2019   Vertigo 04/11/2018   Preventative health care 02/22/2018   Advance directive discussed with patient 02/22/2018   Urge incontinence of urine    Dyslipidemia 02/25/2017   Increased frequency of urination 02/25/2017   Hallux valgus, acquired 09/10/2016   Closed fracture of lower end of humerus 11/10/2011   Pain in elbow 06/08/2011    ONSET DATE: > 6 months   REFERRING DIAG:  Diagnosis  R53.83 (ICD-10-CM) - Fatigue, unspecified type    THERAPY DIAG:  Physical deconditioning  Muscle weakness (generalized)  Rationale for Evaluation and Treatment:  Rehabilitation  SUBJECTIVE:                                                                                                                                                                                             SUBJECTIVE STATEMENT:  Husband present  at end of PT treatment   Pt states that she is doing well. No issues reported to day except feeling sleepy from waking up early.     From Evaluation :   Reports hx of cardiac catheterization after stress test.   States that when she is walking, will get some SOB  after 1/4 of a mile. Husband states that she was walking >3 miles prior to cardiac issues.    Reports multiple bouts of near syncopal episodes within the last few weeks. Including once at PT at [facility] for 3-4 weeks for balance   Husband feels that Cardiac rehab may be more appropriate for management of endurance deficits.   Pt accompanied by: significant other  PERTINENT HISTORY:   From recent MD visit CAD Positive stress echo 11/24/2023.  LHC 12/06/2023 demonstrated moderate single-vessel CAD with recommendation for medical therapy.  Patient continues to have fatigue. Patient's husband inquired about cardiac rehab or physical therapy (see below). Discussed continuing to monitor symptoms and patient's husband is in agreement.  Right wrist cath site is well healed with very mild resolving ecchymosis and no erythema, edema, drainage or tenderness. 2+ radial pulse.  - Continue amlodipine , atorvastatin , aspirin .   Carotid artery stenosis S/p left carotid endarterectomy 09/14/2023.  Carotid ultrasound 09/28/2023 demonstrated no evidence of significant atherosclerotic changes or flow-limiting stenosis in the left carotid system, right ICA <50%. No further presyncopal or syncopal events.  - Continue aspirin , atorvastatin . - Continue to follow with vascular surgery.   Hypertension BP today 132/70.  - Continue amlodipine .   Presyncope 2 weeks ZIO 12/01/2023 demonstrated HR 48, 94 bpm, average 71 bpm, 24 runs of NSVT longest 21 beats.  Patient's husband denies further presyncopal events.  - Continue to monitor.    Fatigue Patient continues to experience increased fatigue per her husband. Discussed a component of deconditioning given the procedures and testing patient had over the last few months. Patient's husband requests referral for cardiac rehab or physical therapy. Referral will be placed for physical therapy at [facility]. Discussed close follow up to assess improvement.  - Refer to  physical therapy for aerobic/strength training.  PAIN:  Are you having pain? No  PRECAUTIONS: Fall and Other: near syncope   RED FLAGS: Bowel or bladder incontinence: Yes: bladder management    WEIGHT BEARING RESTRICTIONS: No  FALLS: Has  patient fallen in last 6 months? Yes. Number of falls 2 near syncopal episodes.   LIVING ENVIRONMENT: Lives with: lives with their spouse Lives in: House/apartment Stairs: No Has following equipment at home: None  PLOF: Independent, Independent with basic ADLs, Independent with household mobility without device, and Independent with community mobility without device  PATIENT GOALS: improve endurance.   OBJECTIVE:  Note: Objective measures were completed at Evaluation unless otherwise noted.  DIAGNOSTIC FINDINGS:   US  Carotid  IMPRESSION: 1. Right carotid system: Mild atherosclerotic changes in the proximal right internal carotid artery is estimated stenosis measuring less than 50%. 2. Left carotid system: No evidence of significant atherosclerotic change or flow-limiting stenosis. Within normal limits.   CARDIAC CATHETERIZATION [[id number]] Resulted: 12/06/23 1437  Order Status: Completed Updated: 12/07/23 0754  Narrative:    Conclusions: Moderate single-vessel coronary artery disease, with sequential 50-60% lesions in the mid LAD.  The LAD is quite tortuous suggestive of hypertensive heart disease.  No significant disease involving the LMCA, LCX, and RCA. Normal left ventricular systolic function (LVEF 55-65%) and filling pressure (LVEDP 15 mmHg)..    COGNITION: Overall cognitive status: History of cognitive impairments - at baseline   SENSATION: WFL  COORDINATION: A to K - WFL     POSTURE: rounded shoulders and forward head  LOWER EXTREMITY ROM:     WFL  LOWER EXTREMITY MMT:    MMT Right Eval Left Eval  Hip flexion 4+ 4+  Hip extension    Hip abduction 4+ 4+  Hip adduction 5 5  Hip internal rotation    Hip  external rotation    Knee flexion 4+ 4+  Knee extension 5 5  Ankle dorsiflexion 5 5  Ankle plantarflexion 4 4  Ankle inversion    Ankle eversion    (Blank rows = not tested)  BED MOBILITY:  Not tested  TRANSFERS: Sit to stand: Complete Independence  Assistive device utilized: None     Stand to sit: Complete Independence  Assistive device utilized: None     Chair to chair: Complete Independence  Assistive device utilized: None       RAMP:  Not tested  CURB:  Findings: Mod I wit self selected UE support on rail   STAIRS: Findings: Level of Assistance: Modified independence, Number of Stairs: 4, Height of Stairs: 4   , and Comments: light UE support  GAIT: Findings: Gait Characteristics: decreased trunk rotation, Distance walked: 1581ft, Assistive device utilized:None, Level of assistance: Complete Independence, and Comments: reports mild SOB 2/10 in 6 min walk test.   FUNCTIONAL TESTS:  5 times sit to stand: 10.16  30 seconds chair stand test 16.5 HR increased to 105 decreased to 78 within 45 sec rest  6 minute walk test: 1540ft  PATIENT SURVEYS:  TREATMENT DATE: 02/15/2024 PT instructed pt in TE and TA to address activity intolerance, improve endurance, and increase strength for function mobility and increase community mobility   Circuit training. X 3 bouts with 30-45 sec between interventions and 1-2 min rest between bouts Weighted ambulation with 3# AW. X 574ft Sit<>stand with 3KG med ball press x 8  Standing march with 3# AW x 15 bil  Forward lunge with UE support x 8 bil 3# AW Fouth bout of weighted gait for 546ft with 3# AW   VS assessed after second bout as pt reported increased sleepiness  Sitting 136/55 HR 81 Standing 110/62 HR 89  Standing 2 min 107/60 HR 95  Following third bout of gait for 574ft 116/62 HR 106  Min cues for  posture and improved hip flexion and heel strike initially with all bouts of gait training. Supervision assist provided from PT throughout session unless otherwise noted.   PATIENT EDUCATION:  Education details: POC. Benefits of skilled PT to address endurance and strengthen deficits; will benefit with transition of care to personal training or self directed fitness program to return to PLOF within several weeks   Review of walking program and HEP review.    Person educated: Patient and Spouse Education method: Explanation and Demonstration Education comprehension: verbalized understanding and returned demonstration  HOME EXERCISE PROGRAM:  Access Code: [email or web address] URL: [email or web address] Date: 01/31/2024 Prepared by: Massie Dollar  Exercises - Seated March With Resistance at Feet  - 1 x daily - 5 x weekly - 3 sets - 10 reps - 2 seconds  hold - Standing Hip Extension with Resistance at Ankles and Counter Support  - 1 x daily - 5 x weekly - 3 sets - 10 reps - 2 seconds hold - Sit to Stand with Arms Crossed  - 1 x daily - 5 x weekly - 3 sets - 10 reps   GOALS: Goals reviewed with patient? Yes   SHORT TERM GOALS: Target date: 02/23/2024    Patient will be independent in home exercise program to improve strength/mobility for better functional independence with ADLs. Baseline: to be initiated at visit 2.  Goal status: INITIAL   LONG TERM GOALS: Target date: 03/01/2024    1.  Patient will increase 30 sec sit to stand by >2 full repetitions indicating an increased LE strength and improved balance. Baseline: 16.5 Goal status: INITIAL  2.  Patient will increase 6 min walk to at least 6 aged matched norm indicating improved function and tolerance to increased community activity  Baseline: 1535ft Goal status: INITIAL  4.  Patient will ascend a flight of stairs without pain and SOB only 2/10 to indicate improved access to community and family events.  Baseline:  to be assessed Goal status: INITIAL    ASSESSMENT:  CLINICAL IMPRESSION: Patient is a 78 y.o. female who was seen today for physical therapy treatment for deconditioning and fatigue due to CAD and carotid artery stenosis. PT continued to instruct pt in circuit training with weighted gait and TE to address strength and balance deficits. Tolerated well only mild SOB or fatigue noted. Noted mild orthostatiuc hypotension without s/s, but HR and BP responding appropriately to activity on this day.  Pt will benefit from Skilled PT treatment to address endurance and strength deficits to improve function and allow return to PLOF and improve overall QoL.   OBJECTIVE IMPAIRMENTS: Abnormal gait, cardiopulmonary status limiting activity, decreased activity tolerance, decreased cognition, decreased coordination, decreased endurance, decreased knowledge of condition, decreased knowledge  of use of DME, decreased mobility, difficulty walking, decreased ROM, and decreased strength.   ACTIVITY LIMITATIONS: stairs and locomotion level  PARTICIPATION LIMITATIONS: shopping, community activity, and church  PERSONAL FACTORS: 1-2 comorbidities: CAD, HTN are also affecting patient's functional outcome.   REHAB POTENTIAL: Excellent  CLINICAL DECISION MAKING: Stable/uncomplicated  EVALUATION COMPLEXITY: Low  PLAN:  PT FREQUENCY: 1-2x/week  PT DURATION: other: 5 weeks   PLANNED INTERVENTIONS: 97164- PT Re-evaluation, 97750- Physical Performance Testing, 97110-Therapeutic exercises, 97530- Therapeutic activity, [id number]- Neuromuscular re-education, 97535- Self Care, [id number]- Manual therapy, [phone]- Gait training, [phone]- Aquatic Therapy, Patient/Family education, Balance training, Stair training, Cognitive remediation, Cryotherapy, and Moist heat  PLAN FOR NEXT SESSION:   Continue activity tolerance training with and without weighted ambulation and nustep.    Massie FORBES Dollar, PT 02/15/2024, 8:00 AM

## 2024-02-17 ENCOUNTER — Ambulatory Visit: Admitting: Physical Therapy

## 2024-02-17 DIAGNOSIS — M6281 Muscle weakness (generalized): Secondary | ICD-10-CM

## 2024-02-17 DIAGNOSIS — R5381 Other malaise: Secondary | ICD-10-CM | POA: Diagnosis not present

## 2024-02-17 LAB — TSH: TSH: 1.12 m[IU]/L (ref 0.40–4.50)

## 2024-02-17 LAB — T4, FREE: Free T4: 1.4 ng/dL (ref 0.8–1.8)

## 2024-02-17 LAB — HEMOGLOBIN A1C
Hgb A1c MFr Bld: 5.6 % (ref <5.7)
Mean Plasma Glucose: 114 mg/dL
eAG (mmol/L): 6.3 mmol/L

## 2024-02-17 LAB — T3, FREE: T3, Free: 3.2 pg/mL (ref 2.3–4.2)

## 2024-02-17 LAB — C-REACTIVE PROTEIN: CRP: <3 mg/L (ref <8.0)

## 2024-02-17 NOTE — Therapy (Signed)
 OUTPATIENT PHYSICAL THERAPY NEURO Treatment,    Patient Name: Julia Snyder MRN: [id number] DOB:08-Sep-1945, 78 y.o., female Today's Date: 02/17/2024   PCP:    Laurence Locus, DO   REFERRING PROVIDER: Loistine Sober, NP   END OF SESSION:  PT End of Session - 02/17/24 0807     Visit Number 6    Number of Visits 10    Date for Recertification  03/01/24    PT Start Time 0804    PT Stop Time 0845    PT Time Calculation (min) 41 min    Equipment Utilized During Treatment Gait belt    Activity Tolerance Patient tolerated treatment well    Behavior During Therapy [facility] for tasks assessed/performed          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   Orthostatic hypotension 11/22/2022   Overactive bladder    Urge incontinence of urine    Vertigo 04/11/2018   Past Surgical History:  Procedure Laterality Date   BREAST BIOPSY Left 2004   benign   BUNIONECTOMY Bilateral    left ~2014, right 2018   COLONOSCOPY     ELBOW FRACTURE SURGERY Bilateral    right 2019, left ~2015   ENDARTERECTOMY Left 09/14/2023   Procedure: ENDARTERECTOMY, CAROTID;  Surgeon: Jama Cordella MATSU, MD;  Location: [facility] ORS;  Service: Vascular;  Laterality: Left;   FRACTURE SURGERY     KNEE ARTHROPLASTY Left 05/14/2022   Procedure: COMPUTER ASSISTED TOTAL KNEE ARTHROPLASTY - RNFA;  Surgeon: Mardee Lynwood SQUIBB, MD;  Location: [facility] ORS;  Service: Orthopedics;  Laterality: Left;   LEFT HEART CATH AND CORONARY ANGIOGRAPHY N/A 12/06/2023   Procedure: LEFT HEART CATH AND CORONARY ANGIOGRAPHY;  Surgeon: Mady Bruckner, MD;  Location: [facility] INVASIVE CV [facility];  Service: Cardiovascular;  Laterality: N/A;   ORIF ELBOW FRACTURE Right 12/01/2017   Procedure: OPEN REDUCTION INTERNAL  FIXATION (ORIF) ELBOW/OLECRANON FRACTURE;  Surgeon: Kathlynn Sharper, MD;  Location: [facility] ORS;  Service: Orthopedics;  Laterality: Right;   OTHER SURGICAL HISTORY  2015   left arm surgery with titanium plate   Patient Active Problem List   Diagnosis Date Noted   Essential hypertension 12/21/2023   Abnormal stress test 12/06/2023   Fatigue 12/06/2023   Near syncope 09/27/2023   Carotid stenosis, asymptomatic 09/14/2023   Carotid stenosis 07/17/2023   Hyperlipidemia 07/17/2023   Arthropathy of lumbar facet joint 06/20/2023   Lumbar spondylosis 06/20/2023   Low back pain 06/20/2023   Nocturnal cough 01/03/2023   Orthostatic hypotension 11/22/2022   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   Osteoarthritis of left knee 03/16/2022   MCI (mild cognitive impairment) with memory loss 12/28/2021   Mood disorder 02/26/2019   Vertigo 04/11/2018   Preventative health care 02/22/2018   Advance directive discussed with patient 02/22/2018   Urge incontinence of urine    Dyslipidemia 02/25/2017   Increased frequency of urination 02/25/2017   Hallux valgus, acquired 09/10/2016   Closed fracture of lower end of humerus 11/10/2011   Pain in elbow 06/08/2011    ONSET DATE: > 6 months   REFERRING DIAG:  Diagnosis  R53.83 (ICD-10-CM) - Fatigue, unspecified type    THERAPY DIAG:  Physical deconditioning  Muscle weakness (generalized)  Rationale for Evaluation and Treatment:  Rehabilitation  SUBJECTIVE:                                                                                                                                                                                             SUBJECTIVE STATEMENT:  Husband present at end of PT treatment   Pt states that she is doing well. Feeling excessively tired compared to prior sessions.    From Evaluation :   Reports hx of cardiac catheterization after stress test.   States that when she is walking, will get some SOB after 1/4 of a  mile. Husband states that she was walking >3 miles prior to cardiac issues.    Reports multiple bouts of near syncopal episodes within the last few weeks. Including once at PT at [facility] for 3-4 weeks for balance   Husband feels that Cardiac rehab may be more appropriate for management of endurance deficits.   Pt accompanied by: significant other  PERTINENT HISTORY:   From recent MD visit CAD Positive stress echo 11/24/2023.  LHC 12/06/2023 demonstrated moderate single-vessel CAD with recommendation for medical therapy.  Patient continues to have fatigue. Patient's husband inquired about cardiac rehab or physical therapy (see below). Discussed continuing to monitor symptoms and patient's husband is in agreement.  Right wrist cath site is well healed with very mild resolving ecchymosis and no erythema, edema, drainage or tenderness. 2+ radial pulse.  - Continue amlodipine , atorvastatin , aspirin .   Carotid artery stenosis S/p left carotid endarterectomy 09/14/2023.  Carotid ultrasound 09/28/2023 demonstrated no evidence of significant atherosclerotic changes or flow-limiting stenosis in the left carotid system, right ICA <50%. No further presyncopal or syncopal events.  - Continue aspirin , atorvastatin . - Continue to follow with vascular surgery.   Hypertension BP today 132/70.  - Continue amlodipine .   Presyncope 2 weeks ZIO 12/01/2023 demonstrated HR 48, 94 bpm, average 71 bpm, 24 runs of NSVT longest 21 beats.  Patient's husband denies further presyncopal events.  - Continue to monitor.    Fatigue Patient continues to experience increased fatigue per her husband. Discussed a component of deconditioning given the procedures and testing patient had over the last few months. Patient's husband requests referral for cardiac rehab or physical therapy. Referral will be placed for physical [facility] at [facility]. Discussed close follow up to assess improvement.  - Refer to physical therapy for  aerobic/strength training.  PAIN:  Are you having pain? No  PRECAUTIONS: Fall and Other: near syncope   RED FLAGS: Bowel or bladder incontinence: Yes: bladder management    WEIGHT BEARING RESTRICTIONS: No  FALLS: Has patient fallen in last 6 months? Yes.  Number of falls 2 near syncopal episodes.   LIVING ENVIRONMENT: Lives with: lives with their spouse Lives in: House/apartment Stairs: No Has following equipment at home: None  PLOF: Independent, Independent with basic ADLs, Independent with household mobility without device, and Independent with community mobility without device  PATIENT GOALS: improve endurance.   OBJECTIVE:  Note: Objective measures were completed at Evaluation unless otherwise noted.  DIAGNOSTIC FINDINGS:   US  Carotid  IMPRESSION: 1. Right carotid system: Mild atherosclerotic changes in the proximal right internal carotid artery is estimated stenosis measuring less than 50%. 2. Left carotid system: No evidence of significant atherosclerotic change or flow-limiting stenosis. Within normal limits.   CARDIAC CATHETERIZATION [[id number]] Resulted: 12/06/23 1437  Order Status: Completed Updated: 12/07/23 0754  Narrative:    Conclusions: Moderate single-vessel coronary artery disease, with sequential 50-60% lesions in the mid LAD.  The LAD is quite tortuous suggestive of hypertensive heart disease.  No significant disease involving the LMCA, LCX, and RCA. Normal left ventricular systolic function (LVEF 55-65%) and filling pressure (LVEDP 15 mmHg)..    COGNITION: Overall cognitive status: History of cognitive impairments - at baseline   SENSATION: WFL  COORDINATION: A to K - WFL     POSTURE: rounded shoulders and forward head  LOWER EXTREMITY ROM:     WFL  LOWER EXTREMITY MMT:    MMT Right Eval Left Eval  Hip flexion 4+ 4+  Hip extension    Hip abduction 4+ 4+  Hip adduction 5 5  Hip internal rotation    Hip external rotation     Knee flexion 4+ 4+  Knee extension 5 5  Ankle dorsiflexion 5 5  Ankle plantarflexion 4 4  Ankle inversion    Ankle eversion    (Blank rows = not tested)  BED MOBILITY:  Not tested  TRANSFERS: Sit to stand: Complete Independence  Assistive device utilized: None     Stand to sit: Complete Independence  Assistive device utilized: None     Chair to chair: Complete Independence  Assistive device utilized: None       RAMP:  Not tested  CURB:  Findings: Mod I wit self selected UE support on rail   STAIRS: Findings: Level of Assistance: Modified independence, Number of Stairs: 4, Height of Stairs: 4   , and Comments: light UE support  GAIT: Findings: Gait Characteristics: decreased trunk rotation, Distance walked: 1560ft, Assistive device utilized:None, Level of assistance: Complete Independence, and Comments: reports mild SOB 2/10 in 6 min walk test.   FUNCTIONAL TESTS:  5 times sit to stand: 10.16  30 seconds chair stand test 16.5 HR increased to 105 decreased to 78 within 45 sec rest  6 minute walk test: 1529ft  PATIENT SURVEYS:                                                                                                                                TREATMENT DATE: 02/17/2024  PT  assessed VS at start of PT treatment:  Sitting: 96/53(67) HR 79  Standing: 90/41 (55) HR 91  Standing 2 min: 78/60 (67) HR 99 mildly symptomatic.   PT located husband and asked him to come to PT gym. Educated husband and pt on indications of decreased BP and effects of prolonged low BP. Encouraged hydration and BP reassessment.  Instructed pt and husband to notify MD if low BP continues.  Due to decreasing BP with increased fatigue, PT treatment discontinued for today  PATIENT EDUCATION:  Education details: POC. Benefits of skilled PT to address endurance and strengthen deficits; will benefit with transition of care to personal training or self directed fitness program to return to PLOF  within several weeks   Effects of low BP and need to address if s/s persist.   Review of walking program and HEP review.    Person educated: Patient and Spouse Education method: Explanation and Demonstration Education comprehension: verbalized understanding and returned demonstration  HOME EXERCISE PROGRAM:  Access Code: [email or web address] URL: [email or web address] Date: 01/31/2024 Prepared by: Massie Dollar  Exercises - Seated March With Resistance at Feet  - 1 x daily - 5 x weekly - 3 sets - 10 reps - 2 seconds  hold - Standing Hip Extension with Resistance at Ankles and Counter Support  - 1 x daily - 5 x weekly - 3 sets - 10 reps - 2 seconds hold - Sit to Stand with Arms Crossed  - 1 x daily - 5 x weekly - 3 sets - 10 reps   GOALS: Goals reviewed with patient? Yes   SHORT TERM GOALS: Target date: 02/23/2024    Patient will be independent in home exercise program to improve strength/mobility for better functional independence with ADLs. Baseline: to be initiated at visit 2.  Goal status: INITIAL   LONG TERM GOALS: Target date: 03/01/2024    1.  Patient will increase 30 sec sit to stand by >2 full repetitions indicating an increased LE strength and improved balance. Baseline: 16.5 Goal status: INITIAL  2.  Patient will increase 6 min walk to at least 6 aged matched norm indicating improved function and tolerance to increased community activity  Baseline: 1564ft Goal status: INITIAL  4.  Patient will ascend a flight of stairs without pain and SOB only 2/10 to indicate improved access to community and family events.  Baseline: to be assessed Goal status: INITIAL    ASSESSMENT:  CLINICAL IMPRESSION: Patient is a 78 y.o. female who was seen today for physical therapy treatment for deconditioning and fatigue due to CAD and carotid artery stenosis. PT noted to have increased orthostatic hypotension on this day with standing BP after 2 min decreased to  78/60(67). Husband and patient educated on the effects of chronic LBP and instructed to notify MD if this persists as well as importance of proper hydration. No additional services provided on this day beyond Pt and family education due to low BP.  Pt will benefit from Skilled PT treatment to address endurance and strength deficits to improve function and allow return to PLOF and improve overall QoL.   OBJECTIVE IMPAIRMENTS: Abnormal gait, cardiopulmonary status limiting activity, decreased activity tolerance, decreased cognition, decreased coordination, decreased endurance, decreased knowledge of condition, decreased knowledge of use of DME, decreased mobility, difficulty walking, decreased ROM, and decreased strength.   ACTIVITY LIMITATIONS: stairs and locomotion level  PARTICIPATION LIMITATIONS: shopping, community activity, and church  PERSONAL FACTORS: 1-2 comorbidities: CAD, HTN are also affecting patient's functional outcome.  REHAB POTENTIAL: Excellent  CLINICAL DECISION MAKING: Stable/uncomplicated  EVALUATION COMPLEXITY: Low  PLAN:  PT FREQUENCY: 1-2x/week  PT DURATION: other: 5 weeks   PLANNED INTERVENTIONS: [id number]- PT Re-evaluation, 97750- Physical Performance Testing, 97110-Therapeutic exercises, 97530- Therapeutic activity, [id number]- Neuromuscular re-education, 97535- Self Care, [id number]- Manual therapy, [phone]- Gait training, [phone]- Aquatic Therapy, Patient/Family education, Balance training, Stair training, Cognitive remediation, Cryotherapy, and Moist heat  PLAN FOR NEXT SESSION:   Assess BP.  Follow up on low BP.  Continue activity tolerance training with and without weighted ambulation and nustep.    Massie FORBES Dollar, PT 02/17/2024, 8:08 AM

## 2024-02-20 ENCOUNTER — Ambulatory Visit: Admitting: Physical Therapy

## 2024-02-20 DIAGNOSIS — R41841 Cognitive communication deficit: Secondary | ICD-10-CM | POA: Diagnosis not present

## 2024-02-21 ENCOUNTER — Ambulatory Visit: Admitting: Physical Therapy

## 2024-02-21 ENCOUNTER — Encounter: Admitting: Nurse Practitioner

## 2024-02-21 DIAGNOSIS — M6281 Muscle weakness (generalized): Secondary | ICD-10-CM

## 2024-02-21 DIAGNOSIS — R5381 Other malaise: Secondary | ICD-10-CM

## 2024-02-21 NOTE — Therapy (Signed)
 OUTPATIENT PHYSICAL THERAPY NEURO Treatment,    Patient Name: Julia Snyder MRN: [id number] DOB:03-Apr-1945, 78 y.o., female Today's Date: 02/21/2024   PCP:    Laurence Locus, DO   REFERRING PROVIDER: Loistine Sober, NP   END OF SESSION:  PT End of Session - 02/21/24 0757     Visit Number 7    Number of Visits 10    Date for Recertification  03/01/24    PT Start Time 0803    PT Stop Time 0845    PT Time Calculation (min) 42 min    Equipment Utilized During Treatment Gait belt    Activity Tolerance Patient tolerated treatment well    Behavior During Therapy [facility] for tasks assessed/performed          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   Orthostatic hypotension 11/22/2022   Overactive bladder    Urge incontinence of urine    Vertigo 04/11/2018   Past Surgical History:  Procedure Laterality Date   BREAST BIOPSY Left 2004   benign   BUNIONECTOMY Bilateral    left ~2014, right 2018   COLONOSCOPY     ELBOW FRACTURE SURGERY Bilateral    right 2019, left ~2015   ENDARTERECTOMY Left 09/14/2023   Procedure: ENDARTERECTOMY, CAROTID;  Surgeon: Jama Cordella MATSU, MD;  Location: [facility] ORS;  Service: Vascular;  Laterality: Left;   FRACTURE SURGERY     KNEE ARTHROPLASTY Left 05/14/2022   Procedure: COMPUTER ASSISTED TOTAL KNEE ARTHROPLASTY - RNFA;  Surgeon: Mardee Lynwood SQUIBB, MD;  Location: [facility] ORS;  Service: Orthopedics;  Laterality: Left;   LEFT HEART CATH AND CORONARY ANGIOGRAPHY N/A 12/06/2023   Procedure: LEFT HEART CATH AND CORONARY ANGIOGRAPHY;  Surgeon: Mady Bruckner, MD;  Location: [facility] INVASIVE CV LAB;  Service: Cardiovascular;  Laterality: N/A;   ORIF ELBOW FRACTURE Right 12/01/2017   Procedure: OPEN REDUCTION INTERNAL  FIXATION (ORIF) ELBOW/OLECRANON FRACTURE;  Surgeon: Kathlynn Sharper, MD;  Location: [facility] ORS;  Service: Orthopedics;  Laterality: Right;   OTHER SURGICAL HISTORY  2015   left arm surgery with titanium plate   Patient Active Problem List   Diagnosis Date Noted   Essential hypertension 12/21/2023   Abnormal stress test 12/06/2023   Fatigue 12/06/2023   Near syncope 09/27/2023   Carotid stenosis, asymptomatic 09/14/2023   Carotid stenosis 07/17/2023   Hyperlipidemia 07/17/2023   Arthropathy of lumbar facet joint 06/20/2023   Lumbar spondylosis 06/20/2023   Low back pain 06/20/2023   Nocturnal cough 01/03/2023   Orthostatic hypotension 11/22/2022   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   Osteoarthritis of left knee 03/16/2022   MCI (mild cognitive impairment) with memory loss 12/28/2021   Mood disorder 02/26/2019   Vertigo 04/11/2018   Preventative health care 02/22/2018   Advance directive discussed with patient 02/22/2018   Urge incontinence of urine    Dyslipidemia 02/25/2017   Increased frequency of urination 02/25/2017   Hallux valgus, acquired 09/10/2016   Closed fracture of lower end of humerus 11/10/2011   Pain in elbow 06/08/2011    ONSET DATE: > 6 months   REFERRING DIAG:  Diagnosis  R53.83 (ICD-10-CM) - Fatigue, unspecified type    THERAPY DIAG:  Physical deconditioning  Muscle weakness (generalized)  Rationale for Evaluation and Treatment:  Rehabilitation  SUBJECTIVE:                                                                                                                                                                                             SUBJECTIVE STATEMENT:  Husband present throughout PT treatment.   States that she feels a little tired this morning. Husband reports that he re-checked BP after last session several times and states that it was significantly improved with    From Evaluation :   Reports hx of cardiac catheterization after  stress test.   States that when she is walking, will get some SOB after 1/4 of a mile. Husband states that she was walking >3 miles prior to cardiac issues.    Reports multiple bouts of near syncopal episodes within the last few weeks. Including once at PT at [facility] for 3-4 weeks for balance   Husband feels that Cardiac rehab may be more appropriate for management of endurance deficits.   Pt accompanied by: significant other  PERTINENT HISTORY:   From recent MD visit CAD Positive stress echo 11/24/2023.  LHC 12/06/2023 demonstrated moderate single-vessel CAD with recommendation for medical therapy.  Patient continues to have fatigue. Patient's husband inquired about cardiac rehab or physical therapy (see below). Discussed continuing to monitor symptoms and patient's husband is in agreement.  Right wrist cath site is well healed with very mild resolving ecchymosis and no erythema, edema, drainage or tenderness. 2+ radial pulse.  - Continue amlodipine , atorvastatin , aspirin .   Carotid artery stenosis S/p left carotid endarterectomy 09/14/2023.  Carotid ultrasound 09/28/2023 demonstrated no evidence of significant atherosclerotic changes or flow-limiting stenosis in the left carotid system, right ICA <50%. No further presyncopal or syncopal events.  - Continue aspirin , atorvastatin . - Continue to follow with vascular surgery.   Hypertension BP today 132/70.  - Continue amlodipine .   Presyncope 2 weeks ZIO 12/01/2023 demonstrated HR 48, 94 bpm, average 71 bpm, 24 runs of NSVT longest 21 beats.  Patient's husband denies further presyncopal events.  - Continue to monitor.    Fatigue Patient continues to experience increased fatigue per her husband. Discussed a component of deconditioning given the procedures and testing patient had over the last few months. Patient's husband requests referral for cardiac rehab or physical therapy. Referral will be placed for physical therapy at [facility].  Discussed close follow up to assess improvement.  - Refer to physical therapy for aerobic/strength training.  PAIN:  Are you having pain? No  PRECAUTIONS: Fall and Other: near syncope   RED FLAGS: Bowel or bladder incontinence: Yes: bladder management    WEIGHT BEARING  RESTRICTIONS: No  FALLS: Has patient fallen in last 6 months? Yes. Number of falls 2 near syncopal episodes.   LIVING ENVIRONMENT: Lives with: lives with their spouse Lives in: House/apartment Stairs: No Has following equipment at home: None  PLOF: Independent, Independent with basic ADLs, Independent with household mobility without device, and Independent with community mobility without device  PATIENT GOALS: improve endurance.   OBJECTIVE:  Note: Objective measures were completed at Evaluation unless otherwise noted.  DIAGNOSTIC FINDINGS:   US  Carotid  IMPRESSION: 1. Right carotid system: Mild atherosclerotic changes in the proximal right internal carotid artery is estimated stenosis measuring less than 50%. 2. Left carotid system: No evidence of significant atherosclerotic change or flow-limiting stenosis. Within normal limits.   CARDIAC CATHETERIZATION [[id number]] Resulted: 12/06/23 1437  Order Status: Completed Updated: 12/07/23 0754  Narrative:    Conclusions: Moderate single-vessel coronary artery disease, with sequential 50-60% lesions in the mid LAD.  The LAD is quite tortuous suggestive of hypertensive heart disease.  No significant disease involving the LMCA, LCX, and RCA. Normal left ventricular systolic function (LVEF 55-65%) and filling pressure (LVEDP 15 mmHg)..    COGNITION: Overall cognitive status: History of cognitive impairments - at baseline   SENSATION: WFL  COORDINATION: A to K - WFL     POSTURE: rounded shoulders and forward head  LOWER EXTREMITY ROM:     WFL  LOWER EXTREMITY MMT:    MMT Right Eval Left Eval  Hip flexion 4+ 4+  Hip extension    Hip  abduction 4+ 4+  Hip adduction 5 5  Hip internal rotation    Hip external rotation    Knee flexion 4+ 4+  Knee extension 5 5  Ankle dorsiflexion 5 5  Ankle plantarflexion 4 4  Ankle inversion    Ankle eversion    (Blank rows = not tested)  BED MOBILITY:  Not tested  TRANSFERS: Sit to stand: Complete Independence  Assistive device utilized: None     Stand to sit: Complete Independence  Assistive device utilized: None     Chair to chair: Complete Independence  Assistive device utilized: None       RAMP:  Not tested  CURB:  Findings: Mod I wit self selected UE support on rail   STAIRS: Findings: Level of Assistance: Modified independence, Number of Stairs: 4, Height of Stairs: 4   , and Comments: light UE support  GAIT: Findings: Gait Characteristics: decreased trunk rotation, Distance walked: 1524ft, Assistive device utilized:None, Level of assistance: Complete Independence, and Comments: reports mild SOB 2/10 in 6 min walk test.   FUNCTIONAL TESTS:  5 times sit to stand: 10.16  30 seconds chair stand test 16.5 HR increased to 105 decreased to 78 within 45 sec rest  6 minute walk test: 1559ft  PATIENT SURVEYS:   PT Vital signs   02/17/2024  PT assessed VS at start of PT treatment:  Sitting: 96/53(67) HR 79  Standing: 90/41 (55) HR 91  Standing 2 min: 78/60 (67) HR 99 mildly symptomatic.  02/21/2024 PT assessed VS at start of PT treatment:  Sitting: 117/67 HR 78 Standing 0 min:  107/58 HR 76 Standing 2 min: 109/51 HR 76 Reassessed in standing after 6 min walking:  124/62 HR 91.  VS post treatment:  Sitting: 117/66 HR 70 Standing: 114/67 HR 84  TREATMENT DATE: 02/21/2024  PT assessed VS at start of PT treatment:  Sitting: 117/67 HR 78 Standing 0 min:  107/58 HR 76 Standing 2 min: 109/51 HR 76 Reassessed in standing after 6 min walking:  124/62  HR 91.   6 min gait without resistance reports no SOB, and able to maintain conversation throughout.  2 min rest break  6 min gait without resistance, reports that she is breathing heavy, buy no SOB. Borg RPE 10/20.  2 min rest break.  Martrix resisted gait with 12.5# forward x 5 and reverse x 5. Rated RPE 12  Single limb step up with contralateral UE hold of 7# db x 12 bil. Cues for improved knee position to improve gluteal activation in the LLE.   VS post treatment:  Sitting: 117/66 HR 70 Standing: 114/67 HR 84  PATIENT EDUCATION:  Education details: POC. Benefits of skilled PT to address endurance and strengthen deficits; will benefit with transition of care to personal training or self directed fitness program to return to PLOF within several weeks   Effects of low BP and need to address if s/s persist.   Review of walking program and HEP review.    Person educated: Patient and Spouse Education method: Explanation and Demonstration Education comprehension: verbalized understanding and returned demonstration  HOME EXERCISE PROGRAM:  Access Code: [email or web address] URL: [email or web address] Date: 01/31/2024 Prepared by: Massie Dollar  Exercises - Seated March With Resistance at Feet  - 1 x daily - 5 x weekly - 3 sets - 10 reps - 2 seconds  hold - Standing Hip Extension with Resistance at Ankles and Counter Support  - 1 x daily - 5 x weekly - 3 sets - 10 reps - 2 seconds hold - Sit to Stand with Arms Crossed  - 1 x daily - 5 x weekly - 3 sets - 10 reps   GOALS: Goals reviewed with patient? Yes   SHORT TERM GOALS: Target date: 02/23/2024    Patient will be independent in home exercise program to improve strength/mobility for better functional independence with ADLs. Baseline: to be initiated at visit 2.  Goal status: INITIAL   LONG TERM GOALS: Target date: 03/01/2024    1.  Patient will increase 30 sec sit to stand by >2 full repetitions indicating an  increased LE strength and improved balance. Baseline: 16.5 Goal status: INITIAL  2.  Patient will increase 6 min walk to at least 6 aged matched norm indicating improved function and tolerance to increased community activity  Baseline: 1582ft Goal status: INITIAL  4.  Patient will ascend a flight of stairs without pain and SOB only 2/10 to indicate improved access to community and family events.  Baseline: to be assessed Goal status: INITIAL    ASSESSMENT:  CLINICAL IMPRESSION: Patient is a 78 y.o. female who was seen today for physical therapy treatment for deconditioning and fatigue due to CAD and carotid artery stenosis. Improved BP noted on this day. Allowing increased prolonged bouts of ambulatory endurance training. Pt rates Borg RPE as only 10 for each bout of ambulatory training, and 12/20 on resisted gait. Was noted initially to have difficulty with gluteal activation on the L side with resisted gait and step ups. But improved with repetitions. Pt will benefit from Skilled PT treatment to address endurance and strength deficits to improve function and allow return to PLOF and improve overall QoL.   OBJECTIVE IMPAIRMENTS: Abnormal gait, cardiopulmonary status limiting activity, decreased activity tolerance, decreased cognition, decreased coordination, decreased  endurance, decreased knowledge of condition, decreased knowledge of use of DME, decreased mobility, difficulty walking, decreased ROM, and decreased strength.   ACTIVITY LIMITATIONS: stairs and locomotion level  PARTICIPATION LIMITATIONS: shopping, community activity, and church  PERSONAL FACTORS: 1-2 comorbidities: CAD, HTN are also affecting patient's functional outcome.   REHAB POTENTIAL: Excellent  CLINICAL DECISION MAKING: Stable/uncomplicated  EVALUATION COMPLEXITY: Low  PLAN:  PT FREQUENCY: 1-2x/week  PT DURATION: other: 5 weeks   PLANNED INTERVENTIONS: [id number]- PT Re-evaluation, 97750- Physical Performance  Testing, 97110-Therapeutic exercises, 97530- Therapeutic activity, [id number]- Neuromuscular re-education, 97535- Self Care, [id number]- Manual therapy, [phone]- Gait training, [phone]- Aquatic Therapy, Patient/Family education, Balance training, Stair training, Cognitive remediation, Cryotherapy, and Moist heat  PLAN FOR NEXT SESSION:   Assess BP.  Continue activity tolerance training with and without weighted ambulation and nustep.  Consider decrease PT frequency to 1x per week.    Massie FORBES Dollar, PT 02/21/2024, 7:58 AM

## 2024-02-22 ENCOUNTER — Ambulatory Visit: Admitting: Physical Therapy

## 2024-02-22 DIAGNOSIS — R5381 Other malaise: Secondary | ICD-10-CM

## 2024-02-22 DIAGNOSIS — M6281 Muscle weakness (generalized): Secondary | ICD-10-CM

## 2024-02-22 NOTE — Therapy (Signed)
 OUTPATIENT PHYSICAL THERAPY NEURO Treatment,    Patient Name: Julia Snyder MRN: [id number] DOB:May 10, 1945, 78 y.o., female Today's Date: 02/22/2024   PCP:    Laurence Locus, DO   REFERRING PROVIDER: Loistine Sober, NP   END OF SESSION:  PT End of Session - 02/22/24 0806     Visit Number 8    Number of Visits 10    Date for Recertification  03/01/24    PT Start Time 0804    PT Stop Time 0845    PT Time Calculation (min) 41 min    Equipment Utilized During Treatment Gait belt    Activity Tolerance Patient tolerated treatment well    Behavior During Therapy [facility] for tasks assessed/performed          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   Orthostatic hypotension 11/22/2022   Overactive bladder    Urge incontinence of urine    Vertigo 04/11/2018   Past Surgical History:  Procedure Laterality Date   BREAST BIOPSY Left 2004   benign   BUNIONECTOMY Bilateral    left ~2014, right 2018   COLONOSCOPY     ELBOW FRACTURE SURGERY Bilateral    right 2019, left ~2015   ENDARTERECTOMY Left 09/14/2023   Procedure: ENDARTERECTOMY, CAROTID;  Surgeon: Jama Cordella MATSU, MD;  Location: [facility] ORS;  Service: Vascular;  Laterality: Left;   FRACTURE SURGERY     KNEE ARTHROPLASTY Left 05/14/2022   Procedure: COMPUTER ASSISTED TOTAL KNEE ARTHROPLASTY - RNFA;  Surgeon: Mardee Lynwood SQUIBB, MD;  Location: [facility] ORS;  Service: Orthopedics;  Laterality: Left;   LEFT HEART CATH AND CORONARY ANGIOGRAPHY N/A 12/06/2023   Procedure: LEFT HEART CATH AND CORONARY ANGIOGRAPHY;  Surgeon: Mady Bruckner, MD;  Location: [facility] INVASIVE CV LAB;  Service: Cardiovascular;  Laterality: N/A;   ORIF ELBOW FRACTURE Right 12/01/2017   Procedure: OPEN REDUCTION INTERNAL  FIXATION (ORIF) ELBOW/OLECRANON FRACTURE;  Surgeon: Kathlynn Sharper, MD;  Location: [facility] ORS;  Service: Orthopedics;  Laterality: Right;   OTHER SURGICAL HISTORY  2015   left arm surgery with titanium plate   Patient Active Problem List   Diagnosis Date Noted   Essential hypertension 12/21/2023   Abnormal stress test 12/06/2023   Fatigue 12/06/2023   Near syncope 09/27/2023   Carotid stenosis, asymptomatic 09/14/2023   Carotid stenosis 07/17/2023   Hyperlipidemia 07/17/2023   Arthropathy of lumbar facet joint 06/20/2023   Lumbar spondylosis 06/20/2023   Low back pain 06/20/2023   Nocturnal cough 01/03/2023   Orthostatic hypotension 11/22/2022   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   Osteoarthritis of left knee 03/16/2022   MCI (mild cognitive impairment) with memory loss 12/28/2021   Mood disorder 02/26/2019   Vertigo 04/11/2018   Preventative health care 02/22/2018   Advance directive discussed with patient 02/22/2018   Urge incontinence of urine    Dyslipidemia 02/25/2017   Increased frequency of urination 02/25/2017   Hallux valgus, acquired 09/10/2016   Closed fracture of lower end of humerus 11/10/2011   Pain in elbow 06/08/2011    ONSET DATE: > 6 months   REFERRING DIAG:  Diagnosis  R53.83 (ICD-10-CM) - Fatigue, unspecified type    THERAPY DIAG:  Physical deconditioning  Muscle weakness (generalized)  Rationale for Evaluation and Treatment:  Rehabilitation  SUBJECTIVE:                                                                                                                                                                                             SUBJECTIVE STATEMENT:  Husband present at end of PT treatment. Reports that she is doing well this AM. No significant updates.    From Evaluation :   Reports hx of cardiac catheterization after stress test.   States that when she is walking, will get some SOB after 1/4 of a mile. Husband states that she  was walking >3 miles prior to cardiac issues.    Reports multiple bouts of near syncopal episodes within the last few weeks. Including once at PT at [facility] for 3-4 weeks for balance   Husband feels that Cardiac rehab may be more appropriate for management of endurance deficits.   Pt accompanied by: significant other  PERTINENT HISTORY:   From recent MD visit CAD Positive stress echo 11/24/2023.  LHC 12/06/2023 demonstrated moderate single-vessel CAD with recommendation for medical therapy.  Patient continues to have fatigue. Patient's husband inquired about cardiac rehab or physical therapy (see below). Discussed continuing to monitor symptoms and patient's husband is in agreement.  Right wrist cath site is well healed with very mild resolving ecchymosis and no erythema, edema, drainage or tenderness. 2+ radial pulse.  - Continue amlodipine , atorvastatin , aspirin .   Carotid artery stenosis S/p left carotid endarterectomy 09/14/2023.  Carotid ultrasound 09/28/2023 demonstrated no evidence of significant atherosclerotic changes or flow-limiting stenosis in the left carotid system, right ICA <50%. No further presyncopal or syncopal events.  - Continue aspirin , atorvastatin . - Continue to follow with vascular surgery.   Hypertension BP today 132/70.  - Continue amlodipine .   Presyncope 2 weeks ZIO 12/01/2023 demonstrated HR 48, 94 bpm, average 71 bpm, 24 runs of NSVT longest 21 beats.  Patient's husband denies further presyncopal events.  - Continue to monitor.    Fatigue Patient continues to experience increased fatigue per her husband. Discussed a component of deconditioning given the procedures and testing patient had over the last few months. Patient's husband requests referral for cardiac rehab or physical therapy. Referral will be placed for physical therapy at [facility]. Discussed close follow up to assess improvement.  - Refer to physical therapy for aerobic/strength  training.  PAIN:  Are you having pain? No  PRECAUTIONS: Fall and Other: near syncope   RED FLAGS: Bowel or bladder incontinence: Yes: bladder management    WEIGHT BEARING RESTRICTIONS: No  FALLS: Has patient fallen in last 6 months? Yes. Number of falls 2 near  syncopal episodes.   LIVING ENVIRONMENT: Lives with: lives with their spouse Lives in: House/apartment Stairs: No Has following equipment at home: None  PLOF: Independent, Independent with basic ADLs, Independent with household mobility without device, and Independent with community mobility without device  PATIENT GOALS: improve endurance.   OBJECTIVE:  Note: Objective measures were completed at Evaluation unless otherwise noted.  DIAGNOSTIC FINDINGS:   US  Carotid  IMPRESSION: 1. Right carotid system: Mild atherosclerotic changes in the proximal right internal carotid artery is estimated stenosis measuring less than 50%. 2. Left carotid system: No evidence of significant atherosclerotic change or flow-limiting stenosis. Within normal limits.   CARDIAC CATHETERIZATION [[id number]] Resulted: 12/06/23 1437  Order Status: Completed Updated: 12/07/23 0754  Narrative:    Conclusions: Moderate single-vessel coronary artery disease, with sequential 50-60% lesions in the mid LAD.  The LAD is quite tortuous suggestive of hypertensive heart disease.  No significant disease involving the LMCA, LCX, and RCA. Normal left ventricular systolic function (LVEF 55-65%) and filling pressure (LVEDP 15 mmHg)..    COGNITION: Overall cognitive status: History of cognitive impairments - at baseline   SENSATION: WFL  COORDINATION: A to K - WFL     POSTURE: rounded shoulders and forward head  LOWER EXTREMITY ROM:     WFL  LOWER EXTREMITY MMT:    MMT Right Eval Left Eval  Hip flexion 4+ 4+  Hip extension    Hip abduction 4+ 4+  Hip adduction 5 5  Hip internal rotation    Hip external rotation    Knee flexion 4+  4+  Knee extension 5 5  Ankle dorsiflexion 5 5  Ankle plantarflexion 4 4  Ankle inversion    Ankle eversion    (Blank rows = not tested)  BED MOBILITY:  Not tested  TRANSFERS: Sit to stand: Complete Independence  Assistive device utilized: None     Stand to sit: Complete Independence  Assistive device utilized: None     Chair to chair: Complete Independence  Assistive device utilized: None       RAMP:  Not tested  CURB:  Findings: Mod I wit self selected UE support on rail   STAIRS: Findings: Level of Assistance: Modified independence, Number of Stairs: 4, Height of Stairs: 4   , and Comments: light UE support  GAIT: Findings: Gait Characteristics: decreased trunk rotation, Distance walked: 1571ft, Assistive device utilized:None, Level of assistance: Complete Independence, and Comments: reports mild SOB 2/10 in 6 min walk test.   FUNCTIONAL TESTS:  5 times sit to stand: 10.16  30 seconds chair stand test 16.5 HR increased to 105 decreased to 78 within 45 sec rest  6 minute walk test: 152ft  PATIENT SURVEYS:   PT Vital signs   02/17/2024  PT assessed VS at start of PT treatment:  Sitting: 96/53(67) HR 79  Standing: 90/41 (55) HR 91  Standing 2 min: 78/60 (67) HR 99 mildly symptomatic.  02/21/2024 PT assessed VS at start of PT treatment:  Sitting: 117/67 HR 78 Standing 0 min:  107/58 HR 76 Standing 2 min: 109/51 HR 76 Reassessed in standing after 6 min walking:  124/62 HR 91.  VS post treatment:  Sitting: 117/66 HR 70 Standing: 114/67 HR 84  TREATMENT DATE: 02/22/2024  PT assessed VS at start of PT treatment:  Sitting: 117/71 HR 70 Standing: 112/53 HR 82  Endurance ambulation training on treadmill x 10 min at 1.18mph. pt was noted to have difficulty with step length and posture initially, but improved with cues from PT for increased heel  contact on each step as well as increased step length.  Pt was noted to have moderate SOB for last 4 min with breathy voice when talking.   BP assessed upon completion:  Standing 75/57  Sitting 108/60  Sitting 2 min 120/64.   Pt reports no s/s throughout gait or upon completion and was noted to be engaged in conversion without change in arousal despite low initial BP assessed reading upon completion.   5 min rest rest break   Overground ambulatory training x 10 min for > 2079ft. Reduced SOB noted with gait over ground. BP assessed upon completion 112/60 standing. Sitting: 115/65. No orthostatic s/s reported.   3 min rest break.   6 min over ground endurance training x ~1500. Min cues for attention to task due to mild lateral veer with head turns.  No additional reported orthostatic s/s. Requested hydration break between bouts.   PATIENT EDUCATION:  Education details: POC. Benefits of skilled PT to address endurance and strengthen deficits; will benefit with transition of care to personal training or self directed fitness program to return to PLOF within several weeks   Importance of walking program .    Person educated: Patient and Spouse Education method: Explanation and Demonstration Education comprehension: verbalized understanding and returned demonstration  HOME EXERCISE PROGRAM:  Access Code: [email or web address] URL: [email or web address] Date: 01/31/2024 Prepared by: Massie Dollar  Exercises - Seated March With Resistance at Feet  - 1 x daily - 5 x weekly - 3 sets - 10 reps - 2 seconds  hold - Standing Hip Extension with Resistance at Ankles and Counter Support  - 1 x daily - 5 x weekly - 3 sets - 10 reps - 2 seconds hold - Sit to Stand with Arms Crossed  - 1 x daily - 5 x weekly - 3 sets - 10 reps   GOALS: Goals reviewed with patient? Yes   SHORT TERM GOALS: Target date: 02/23/2024    Patient will be independent in home exercise program to improve  strength/mobility for better functional independence with ADLs. Baseline: to be initiated at visit 2.  Goal status: INITIAL   LONG TERM GOALS: Target date: 03/01/2024    1.  Patient will increase 30 sec sit to stand by >2 full repetitions indicating an increased LE strength and improved balance. Baseline: 16.5 Goal status: INITIAL  2.  Patient will increase 6 min walk to at least 6 aged matched norm indicating improved function and tolerance to increased community activity  Baseline: 1598ft Goal status: INITIAL  4.  Patient will ascend a flight of stairs without pain and SOB only 2/10 to indicate improved access to community and family events.  Baseline: to be assessed Goal status: INITIAL    ASSESSMENT:  CLINICAL IMPRESSION: Patient is a 78 y.o. female who was seen today for physical therapy treatment for deconditioning and fatigue due to CAD and carotid artery stenosis. Improved BP noted on this day. Allowing increased prolonged bouts of ambulatory endurance training including treadmill training. CGA for safety due to difficulty initially with full step height and length on treadmill resulting In mild trip. Was noted to have low BP initially when completing first bout of  gait training, but no reported s/s of orthostatic hypotension. Pt will benefit from Skilled PT treatment to address endurance and strength deficits to improve function and allow return to PLOF and improve overall QoL.   OBJECTIVE IMPAIRMENTS: Abnormal gait, cardiopulmonary status limiting activity, decreased activity tolerance, decreased cognition, decreased coordination, decreased endurance, decreased knowledge of condition, decreased knowledge of use of DME, decreased mobility, difficulty walking, decreased ROM, and decreased strength.   ACTIVITY LIMITATIONS: stairs and locomotion level  PARTICIPATION LIMITATIONS: shopping, community activity, and church  PERSONAL FACTORS: 1-2 comorbidities: CAD, HTN are also  affecting patient's functional outcome.   REHAB POTENTIAL: Excellent  CLINICAL DECISION MAKING: Stable/uncomplicated  EVALUATION COMPLEXITY: Low  PLAN:  PT FREQUENCY: 1-2x/week  PT DURATION: other: 5 weeks   PLANNED INTERVENTIONS: [id number]- PT Re-evaluation, 97750- Physical Performance Testing, 97110-Therapeutic exercises, 97530- Therapeutic activity, [id number]- Neuromuscular re-education, 97535- Self Care, [id number]- Manual therapy, [phone]- Gait training, [phone]- Aquatic Therapy, Patient/Family education, Balance training, Stair training, Cognitive remediation, Cryotherapy, and Moist heat  PLAN FOR NEXT SESSION:   Assess BP.  Continue activity tolerance training with and without weighted ambulation and nustep.     Massie FORBES Dollar, PT 02/22/2024, 8:07 AM

## 2024-02-23 ENCOUNTER — Non-Acute Institutional Stay: Payer: Self-pay | Admitting: Internal Medicine

## 2024-02-23 ENCOUNTER — Encounter: Payer: Self-pay | Admitting: Internal Medicine

## 2024-02-23 VITALS — BP 124/68 | HR 73 | Temp 97.6°F | Ht 63.0 in | Wt 138.0 lb

## 2024-02-23 DIAGNOSIS — I2510 Atherosclerotic heart disease of native coronary artery without angina pectoris: Secondary | ICD-10-CM | POA: Insufficient documentation

## 2024-02-23 DIAGNOSIS — M545 Low back pain, unspecified: Secondary | ICD-10-CM | POA: Diagnosis not present

## 2024-02-23 DIAGNOSIS — F0280 Dementia in other diseases classified elsewhere without behavioral disturbance: Secondary | ICD-10-CM | POA: Diagnosis not present

## 2024-02-23 DIAGNOSIS — I6523 Occlusion and stenosis of bilateral carotid arteries: Secondary | ICD-10-CM

## 2024-02-23 DIAGNOSIS — G8929 Other chronic pain: Secondary | ICD-10-CM | POA: Diagnosis not present

## 2024-02-23 DIAGNOSIS — G301 Alzheimer's disease with late onset: Secondary | ICD-10-CM

## 2024-02-23 DIAGNOSIS — R5382 Chronic fatigue, unspecified: Secondary | ICD-10-CM | POA: Diagnosis not present

## 2024-02-23 DIAGNOSIS — Z0000 Encounter for general adult medical examination without abnormal findings: Secondary | ICD-10-CM | POA: Diagnosis not present

## 2024-02-23 DIAGNOSIS — E785 Hyperlipidemia, unspecified: Secondary | ICD-10-CM | POA: Diagnosis not present

## 2024-02-23 DIAGNOSIS — I10 Essential (primary) hypertension: Secondary | ICD-10-CM | POA: Diagnosis not present

## 2024-02-23 MED ORDER — AMLODIPINE BESYLATE 2.5 MG PO TABS: 2.5000 mg | ORAL_TABLET | Freq: Every day | ORAL | 3 refills | Status: DC

## 2024-02-23 MED ORDER — ATORVASTATIN CALCIUM 40 MG PO TABS: 40.0000 mg | ORAL_TABLET | Freq: Every day | ORAL | 3 refills | Status: AC

## 2024-02-23 MED ORDER — ALENDRONATE SODIUM 70 MG PO TABS: 70.0000 mg | ORAL_TABLET | ORAL | 3 refills | Status: AC

## 2024-02-23 MED ORDER — MEMANTINE HCL 10 MG PO TABS: 10.0000 mg | ORAL_TABLET | Freq: Two times a day (BID) | ORAL | 3 refills | Status: AC

## 2024-02-23 NOTE — Assessment & Plan Note (Addendum)
 She is status post left carotid endarterectomy due to a 90% stenosis found by her dentist.  She has regular follow-up with her vascular surgeon.

## 2024-02-23 NOTE — Assessment & Plan Note (Addendum)
 Patient has established with Dr. Maree with neurology.  She had a elevated P tau 217 protein.  She has not yet had a Amyvid PET scan.  I recommended that she follow-up with Dr. Maree to get this ordered.  Patient's husband is very interested in having the patient start any therapy that may delay the onset of worsening dementia.  Patient has not had any side effects of Namenda.  She is only on 5 mg twice a day.  I suggested that she increase her dose to 5 mg twice daily.  He will use the remaining supply of 5 mg tablets for her and then increase to 10 mg twice daily.  She has not had any dizziness, headache, diarrhea.  She has not had any gait imbalance.  She has not had any bradycardia.  Cognitive impairment with short-term memory issues. 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 Neurologist suspects primarily vascular dementia with potential amyloid involvement. PET scan discussed to confirm Alzheimer's plaques, which would qualify for Leqembi or Kesunla if positive. Current treatment with Namenda 5 mg daily, no side effects reported. Increasing dose to 10 mg twice daily recommended to achieve therapeutic levels. - Increased Namenda to 10 mg twice daily. - Discuss PET scan with neurologist to confirm Alzheimer's plaques. - Continue speech therapy and use of to-do lists and journaling for memory support.

## 2024-02-23 NOTE — Assessment & Plan Note (Addendum)
 Patient has chronic fatigue.  This precedes her left total knee arthroplasty that she had done in March 2024.  It

## 2024-02-23 NOTE — Assessment & Plan Note (Addendum)
 Engaged in physical therapy and exercise to improve stamina. On a plant-based diet, beneficial for overall health and cholesterol management. Encouraged to increase exercise frequency to improve longevity and cognitive function. - Continue plant-based diet. - Increase exercise frequency to improve stamina and cognitive function. - Will coordinate with physical therapist for exercise program and potential resistance training.

## 2024-02-23 NOTE — Assessment & Plan Note (Addendum)
 Patient needs to be on a regular exercise program.  I have reached out to Lorn Dimitri who is the nurse, children's at [facility].  She will engage with the patient and her husband to see what fitness programs she would want to participate in and complete a fitness assessment.

## 2024-02-23 NOTE — Assessment & Plan Note (Addendum)
 Patient with left heart catheterization in September 2025.  She had a 60% mid LAD lesion.  She had a tortuous LAD.  Cardiology placed her on 2.5 mg of Norvasc  for blood pressure control and antianginal therapy.  She remains on aspirin  81 mg daily, Lipitor 40 mg daily, omega-3 fatty acid 2500 mg daily.

## 2024-02-23 NOTE — Assessment & Plan Note (Addendum)
 Continue with Lipitor 40 mg daily, omega-3 fatty acid 2500 mg daily.

## 2024-02-23 NOTE — Progress Notes (Signed)
 [facility] Outpatient Progress Note      Careteam: Patient Care Team: Laurence Locus, DO as PCP - General (Internal Medicine) Perla Evalene PARAS, MD as PCP - Cardiology (Cardiology) PLACE OF SERVICE: [facility]   Advanced Directive information Does Patient Have a Medical Advance Directive?: Yes, Type of Advance Directive: Healthcare Power of [place];Living will;Out of facility DNR (pink MOST or yellow form), Does patient want to make changes to medical advance directive?: No - Patient declined   Allergies[1]   Chief Complaint  Patient presents with   Medical Management of Chronic Issues    Medical Management of Chronic Issues. 2 Month follow up with Labs.      HPI: Patient is a 78 y.o. female seen in today in [facility] outpatient clinic.  Discussed the use of AI scribe software for clinical note transcription with the patient, who gave verbal consent to proceed.  History of Present Illness   Julia Snyder is a 78 year old female with carotid stenosis and cognitive impairment who presents with extreme fatigue and memory issues. She is accompanied by her husband.  Fatigue and exercise intolerance - Extreme fatigue since carotid surgery and knee replacement approximately 1.5 years ago - Unable to maintain previous activity level of walking three miles daily due to fatigue - Currently participating in physical therapy to improve stamina  Syncope and hypotension - Fainting spells following carotid surgery - Low blood pressure, typically 100-105 mmHg, sometimes lower - On low-dose amlodipine  for angina  Cognitive impairment - Short-term memory deficits - Neurologist evaluation with MRI showing 11% brain atrophy and slight elevation in TA - On memantine (Namenda) 5 mg daily for 3-4 months without side effects - Attends weekly speech therapy for memory strategies  Coronary artery disease and angina - History of carotid stenosis with >90% blockage, status post  carotid surgery in January - Stress test with below normal results - Cardiac catheterization revealing 50-60% LAD stenosis, no intervention performed - No chest pain, shortness of breath, or palpitations  Glycemic control and diet - Plant-based diet resulting in improved cholesterol: total cholesterol decreased from 241 to 143 mg/dL, LDL from 851 to 68 mg/dL - Concern about sugar intake - Hemoglobin A1c 5.6% - No diabetes  Gastrointestinal and neurological symptoms - No nausea, vomiting, or diarrhea - No balance issues, neuropathy, or burning/tingling in legs and feet      Review of Systems: Review of Systems  Constitutional:  Positive for malaise/fatigue.       Chronic fatigue. Precedes her carotid disease. Had fatigue when she had her right knee replacement.  HENT: Negative.    Eyes: Negative.   Respiratory: Negative.    Cardiovascular: Negative.   Gastrointestinal: Negative.   Genitourinary: Negative.   Musculoskeletal: Negative.   Skin: Negative.   Neurological:        Short term memory forgetfulness  Endo/Heme/Allergies: Negative.   Psychiatric/Behavioral:  Positive for depression.   All other systems reviewed and are negative.    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   Urge incontinence of  urine    Vertigo 04/11/2018   Past Surgical History:  Procedure Laterality Date   BREAST BIOPSY Left 2004   benign   BUNIONECTOMY Bilateral    left ~2014, right 2018   COLONOSCOPY     ELBOW FRACTURE SURGERY Bilateral    right 2019, left ~2015   ENDARTERECTOMY  Left 09/14/2023   Procedure: ENDARTERECTOMY, CAROTID;  Surgeon: Jama Cordella MATSU, MD;  Location: [facility] ORS;  Service: Vascular;  Laterality: Left;   FRACTURE SURGERY     KNEE ARTHROPLASTY Left 05/14/2022   Procedure: COMPUTER ASSISTED TOTAL KNEE ARTHROPLASTY - RNFA;  Surgeon: Mardee Lynwood SQUIBB, MD;  Location: [facility] ORS;  Service: Orthopedics;  Laterality: Left;   LEFT HEART CATH AND CORONARY ANGIOGRAPHY N/A 12/06/2023   Procedure: LEFT HEART CATH AND CORONARY ANGIOGRAPHY;  Surgeon: Mady Bruckner, MD;  Location: [facility] INVASIVE CV LAB;  Service: Cardiovascular;  Laterality: N/A;   ORIF ELBOW FRACTURE Right 12/01/2017   Procedure: OPEN REDUCTION INTERNAL FIXATION (ORIF) ELBOW/OLECRANON FRACTURE;  Surgeon: Kathlynn Sharper, MD;  Location: [facility] ORS;  Service: Orthopedics;  Laterality: Right;   OTHER SURGICAL HISTORY  2015   left arm surgery with titanium plate   Social History:  reports that she has quit smoking. Her smoking use included cigarettes. She has been exposed to tobacco smoke. She has never used smokeless tobacco. She reports current alcohol use. She reports that she does not use drugs.   Family History  Problem Relation Age of Onset   Stroke Mother    Heart disease Mother    Parkinson's disease Father    Diabetes Neg Hx    Cancer Neg Hx      Medications: Patient's Medications  New Prescriptions   No medications on file  Previous Medications   ASCORBIC ACID (VITAMIN C) 1000 MG TABLET    Take 1,000 mg by mouth daily.   ASPIRIN  EC 81 MG TABLET    Take 1 tablet (81 mg total) by mouth daily at 6 (six) AM. Swallow whole.   CALCIUM  CARBONATE-VIT D-MIN (CALCIUM  1200 PO)    Take 1,200 mg by mouth daily. Vit D3 25 mcg   CYANOCOBALAMIN  2000 MCG TABLET    Take 2,000 mcg by mouth daily. Vit B12   MULTIPLE VITAMINS-MINERALS (MULTIPLE VITAMINS/WOMENS PO)    Take 1 tablet by mouth daily. Woman 50+   OMEGA-3 FATTY ACIDS (FISH OIL PO)    Take 2,500 mg by mouth daily.   VITAMIN K 100 MCG TABLET     Take 100 mcg by mouth daily. Vit k7  Modified Medications   Modified Medication Previous Medication   ALENDRONATE (FOSAMAX) 70 MG TABLET alendronate (FOSAMAX) 70 MG tablet      Take 1 tablet (70 mg total) by mouth once a week.    Take 70 mg by mouth once a week.   AMLODIPINE  (NORVASC ) 2.5 MG TABLET amLODipine  (NORVASC ) 2.5 MG tablet      Take 1 tablet (2.5 mg total) by mouth daily.    Take 1 tablet (2.5 mg total) by mouth daily.   ATORVASTATIN  (LIPITOR) 40 MG TABLET atorvastatin  (LIPITOR) 40 MG tablet      Take 1 tablet (40 mg total) by mouth daily.    Take 1 tablet (40 mg total) by mouth daily.   MEMANTINE (NAMENDA) 10 MG TABLET memantine (NAMENDA) 5 MG tablet      Take 1 tablet (10 mg total) by mouth 2 (two) times daily.    Take 5 mg by mouth 2 (two) times  daily.  Discontinued Medications   No medications on file     Physical Exam:   Vitals:   02/23/24 0853  BP: 124/68  Pulse: 73  Temp: 97.6 F (36.4 C)  SpO2: 97%  Weight: 138 lb (62.6 kg)  Height: 5' 3 (1.6 m)   Body mass index is 24.45 kg/m. Wt Readings from Last 3 Encounters:  02/23/24 138 lb (62.6 kg)  01/02/24 136 lb 9.6 oz (62 kg)  12/29/23 135 lb 9.6 oz (61.5 kg)     Physical Exam Vitals and nursing note reviewed.  Constitutional:      General: She is not in acute distress.    Appearance: She is normal weight. She is not toxic-appearing.  HENT:     Head: Normocephalic and atraumatic.     Nose: Nose normal.  Eyes:     General: No scleral icterus. Cardiovascular:     Rate and Rhythm: Normal rate and regular rhythm.  Pulmonary:     Effort: Pulmonary effort is normal.     Breath sounds: Normal breath sounds.  Abdominal:     General: Abdomen is flat. Bowel sounds are normal. There is no distension.     Palpations: Abdomen is soft.  Musculoskeletal:     Right lower leg: No edema.     Left lower leg: No edema.  Skin:    General: Skin is warm and dry.     Capillary Refill: Capillary refill takes less than 2  seconds.  Neurological:     Mental Status: She is alert.     Cranial Nerves: No cranial nerve deficit.     Comments: Forgot name of her neurologist.    Physical Exam   VITALS: P- 74, BP- 124/60       Labs reviewed: Basic Metabolic Panel: Recent Labs    03/24/23 0850 08/31/23 0852 09/27/23 1152 09/28/23 0356 12/01/23 1626 02/16/24 0745  NA 139   < > 135 138 137  --   K 3.9   < > 4.4 3.8 4.5  --   CL 100   < > 98 104 98  --   CO2 29   < > 26 25 23   --   GLUCOSE 101*   < > 138* 101* 92  --   BUN 13   < > 14 15 16   --   CREATININE 0.71   < > 0.65 0.61 0.73  --   CALCIUM  9.5   < > 9.3 9.0 10.2  --   TSH 1.47  --  0.709  --   --  1.12   < > = values in this interval not displayed.   Liver Function Tests: Recent Labs    03/24/23 0850 09/27/23 1152 09/28/23 0356  AST 22 27 27   ALT 17 28 24   ALKPHOS 93 97 95  BILITOT 0.6 0.4 0.5  PROT 6.8 6.9 6.4*  ALBUMIN 4.5 4.1 3.7    CBC: Recent Labs    08/31/23 0852 09/15/23 0548 09/27/23 1152 09/28/23 0356 12/01/23 1626  WBC 5.1   < > 7.0 6.1 7.5  NEUTROABS 2.7  --   --   --   --   HGB 13.8   < > 13.0 12.3 13.6  HCT 42.9   < > 40.6 38.4 42.3  MCV 89.0   < > 90.4 89.5 90  PLT 308   < > 366 318 319   < > = values in this interval not displayed.   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 Maree with neurology.  She had a elevated P tau 217 protein.  She has not yet had a Amyvid PET scan.  I recommended that she follow-up with Dr. Maree to get this ordered.  Patient's husband is very interested in having the patient start any therapy that may  delay the onset of worsening dementia.  Patient has not had any side effects of Namenda.  She is only on 5 mg twice a day.  I suggested that she increase her dose to 5 mg twice daily.  He will use the remaining supply of 5 mg tablets for her and then increase to 10 mg twice daily.  She has not had any dizziness, headache, diarrhea.  She has not had any gait imbalance.  She has not had any bradycardia.  Cognitive impairment with short-term memory issues. 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 Neurologist suspects primarily vascular dementia with potential amyloid involvement. PET scan discussed to confirm Alzheimer's plaques, which would qualify for Leqembi or Kesunla if positive. Current treatment with Namenda 5 mg daily, no side effects reported. Increasing dose to 10 mg twice daily recommended to achieve therapeutic levels. - Increased Namenda to 10 mg twice daily. - Discuss PET scan with neurologist to confirm Alzheimer's plaques. - Continue speech therapy and use of to-do lists and journaling for memory support.     Chronic midline low back pain without sciatica Patient needs to be on a regular exercise program.  I have reached out to Lorn Dimitri who is the nurse, children's at [facility].  She will engage with the patient and her husband to see what fitness programs she would want to participate in and complete a fitness assessment.    Coronary artery disease involving native coronary artery of native heart without angina pectoris Patient with left heart catheterization in September 2025.  She had a 60% mid LAD lesion.  She had a tortuous LAD.  Cardiology placed her on 2.5 mg of Norvasc  for blood pressure control and antianginal therapy.  She remains on aspirin  81 mg daily, Lipitor 40 mg daily, omega-3 fatty acid 2500 mg daily.     Dyslipidemia Continue with Lipitor 40 mg daily, omega-3 fatty acid 2500 mg daily.    Bilateral carotid artery  stenosis She is status post left carotid endarterectomy due to a 90% stenosis found by her dentist.  She has regular follow-up with her vascular surgeon.     Chronic fatigue Patient has chronic fatigue.  This precedes her left total knee arthroplasty that she had done in March 2024.  It    Essential hypertension Patient is to continue Norvasc  2.5 mg daily.  This was also started for its antianginal effects by cardiology.     Encounter for health maintenance examination in adult Engaged in physical therapy and exercise to improve stamina. On a plant-based diet, beneficial for overall health and cholesterol management. Encouraged to increase exercise frequency to improve longevity and cognitive function. - Continue plant-based diet. - Increase exercise frequency to improve stamina  and cognitive function. - Will coordinate with physical therapist for exercise program and potential resistance training.         Meds ordered this encounter  Medications   alendronate (FOSAMAX) 70 MG tablet    Sig: Take 1 tablet (70 mg total) by mouth once a week.    Dispense:  12 tablet    Refill:  3   amLODipine  (NORVASC ) 2.5 MG tablet    Sig: Take 1 tablet (2.5 mg total) by mouth daily.    Dispense:  90 tablet    Refill:  3   atorvastatin  (LIPITOR) 40 MG tablet    Sig: Take 1 tablet (40 mg total) by mouth daily.    Dispense:  90 tablet    Refill:  3   memantine (NAMENDA) 10 MG tablet    Sig: Take 1 tablet (10 mg total) by mouth 2 (two) times daily.    Dispense:  180 tablet    Refill:  3    No orders of the defined types were placed in this encounter.  Next appt: 3 months to discuss neurology plans about her dementia.  Julia Door, DO [facility] [phone]     [1]  Allergies Allergen Reactions   Donepezil Other (See Comments)    Sedation.

## 2024-02-23 NOTE — Assessment & Plan Note (Addendum)
 Patient is to continue Norvasc  2.5 mg daily.  This was also started for its antianginal effects by cardiology.

## 2024-02-27 ENCOUNTER — Ambulatory Visit: Admitting: Physical Therapy

## 2024-02-29 ENCOUNTER — Ambulatory Visit: Admitting: Physical Therapy

## 2024-02-29 DIAGNOSIS — R5381 Other malaise: Secondary | ICD-10-CM | POA: Diagnosis not present

## 2024-02-29 DIAGNOSIS — M6281 Muscle weakness (generalized): Secondary | ICD-10-CM

## 2024-02-29 NOTE — Therapy (Signed)
 OUTPATIENT PHYSICAL THERAPY NEURO Treatment, / re-certification    Patient Name: Julia Snyder MRN: [id number] DOB:Jun 19, 1945, 78 y.o., female Today's Date: 02/29/2024   PCP:    Laurence Locus, DO   REFERRING PROVIDER: Loistine Sober, NP   END OF SESSION:  PT End of Session - 02/29/24 0805     Visit Number 9    Number of Visits 15    Date for Recertification  03/29/24    PT Start Time 0803    PT Stop Time 0845    PT Time Calculation (min) 42 min    Equipment Utilized During Treatment Gait belt    Activity Tolerance Patient tolerated treatment well    Behavior During Therapy [facility] for tasks assessed/performed          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   Urge incontinence of urine    Vertigo 04/11/2018   Past Surgical History:  Procedure Laterality Date   BREAST BIOPSY Left 2004   benign   BUNIONECTOMY Bilateral    left ~2014, right 2018   COLONOSCOPY     ELBOW FRACTURE SURGERY Bilateral    right 2019, left ~2015   ENDARTERECTOMY Left 09/14/2023   Procedure: ENDARTERECTOMY, CAROTID;  Surgeon: Jama Cordella MATSU, MD;  Location: [facility] ORS;  Service: Vascular;  Laterality: Left;   FRACTURE SURGERY     KNEE ARTHROPLASTY Left 05/14/2022   Procedure: COMPUTER ASSISTED TOTAL KNEE ARTHROPLASTY - RNFA;  Surgeon: Mardee Lynwood SQUIBB, MD;  Location: [facility] ORS;  Service: Orthopedics;  Laterality: Left;   LEFT HEART CATH AND CORONARY ANGIOGRAPHY N/A 12/06/2023   Procedure: LEFT HEART CATH AND CORONARY ANGIOGRAPHY;  Surgeon: Mady Bruckner, MD;  Location: [facility]  INVASIVE CV LAB;  Service: Cardiovascular;  Laterality: N/A;   ORIF ELBOW FRACTURE Right 12/01/2017   Procedure: OPEN REDUCTION INTERNAL FIXATION (ORIF) ELBOW/OLECRANON FRACTURE;  Surgeon: Kathlynn Sharper, MD;  Location: [facility] ORS;  Service: Orthopedics;  Laterality: Right;   OTHER SURGICAL HISTORY  2015   left arm surgery with titanium plate   Patient Active Problem List   Diagnosis Date Noted   CAD (coronary artery disease), native coronary artery 02/23/2024   Essential hypertension 12/21/2023   Chronic fatigue 12/06/2023   Carotid stenosis - s/p left CEA 07/17/2023   Arthropathy of lumbar facet joint 06/20/2023   Lumbar spondylosis 06/20/2023   Low back pain 06/20/2023   Orthostatic hypotension 11/22/2022   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) 12/28/2021   Mood disorder 02/26/2019   Vertigo 04/11/2018   Encounter for health maintenance examination in adult 02/22/2018   Advance directive discussed with patient 02/22/2018   Urge incontinence of urine    Dyslipidemia 02/25/2017   Increased frequency of urination 02/25/2017   Hallux valgus, acquired 09/10/2016    ONSET DATE: > 6 months   REFERRING DIAG:  Diagnosis  R53.83 (ICD-10-CM) - Fatigue, unspecified type    THERAPY DIAG:  Physical deconditioning  Muscle weakness (generalized)  Rationale for Evaluation and Treatment: Rehabilitation  SUBJECTIVE:  SUBJECTIVE STATEMENT:  Husband present at end of PT treatment. Reports that she is doing well this AM. No significant updates.  Reports that she will be meeting with fitness coordinator at [facility] in 2 weeks.    From Evaluation :   Reports hx of cardiac catheterization after stress test.   States that when she is walking, will get some SOB  after 1/4 of a mile. Husband states that she was walking >3 miles prior to cardiac issues.    Reports multiple bouts of near syncopal episodes within the last few weeks. Including once at PT at [facility] for 3-4 weeks for balance   Husband feels that Cardiac rehab may be more appropriate for management of endurance deficits.   Pt accompanied by: significant other  PERTINENT HISTORY:   From recent MD visit CAD Positive stress echo 11/24/2023.  LHC 12/06/2023 demonstrated moderate single-vessel CAD with recommendation for medical therapy.  Patient continues to have fatigue. Patient's husband inquired about cardiac rehab or physical therapy (see below). Discussed continuing to monitor symptoms and patient's husband is in agreement.  Right wrist cath site is well healed with very mild resolving ecchymosis and no erythema, edema, drainage or tenderness. 2+ radial pulse.  - Continue amlodipine , atorvastatin , aspirin .   Carotid artery stenosis S/p left carotid endarterectomy 09/14/2023.  Carotid ultrasound 09/28/2023 demonstrated no evidence of significant atherosclerotic changes or flow-limiting stenosis in the left carotid system, right ICA <50%. No further presyncopal or syncopal events.  - Continue aspirin , atorvastatin . - Continue to follow with vascular surgery.   Hypertension BP today 132/70.  - Continue amlodipine .   Presyncope 2 weeks ZIO 12/01/2023 demonstrated HR 48, 94 bpm, average 71 bpm, 24 runs of NSVT longest 21 beats.  Patient's husband denies further presyncopal events.  - Continue to monitor.    Fatigue Patient continues to experience increased fatigue per her husband. Discussed a component of deconditioning given the procedures and testing patient had over the last few months. Patient's husband requests referral for cardiac rehab or physical therapy. Referral will be placed for physical therapy at [facility]. Discussed close follow up to assess improvement.  - Refer to  physical therapy for aerobic/strength training.  PAIN:  Are you having pain? No  PRECAUTIONS: Fall and Other: near syncope   RED FLAGS: Bowel or bladder incontinence: Yes: bladder management    WEIGHT BEARING RESTRICTIONS: No  FALLS: Has patient fallen in last 6 months? Yes. Number of falls 2 near syncopal episodes.   LIVING ENVIRONMENT: Lives with: lives with their spouse Lives in: House/apartment Stairs: No Has following equipment at home: None  PLOF: Independent, Independent with basic ADLs, Independent with household mobility without device, and Independent with community mobility without device  PATIENT GOALS: improve endurance.   OBJECTIVE:  Note: Objective measures were completed at Evaluation unless otherwise noted.  DIAGNOSTIC FINDINGS:   US  Carotid  IMPRESSION: 1. Right carotid system: Mild atherosclerotic changes in the proximal right internal carotid artery is estimated stenosis measuring less than 50%. 2. Left carotid system: No evidence of significant atherosclerotic change or flow-limiting stenosis. Within normal limits.   CARDIAC CATHETERIZATION [[id number]] Resulted: 12/06/23 1437  Order Status: Completed Updated: 12/07/23 0754  Narrative:    Conclusions: Moderate single-vessel coronary artery disease, with sequential 50-60% lesions in the mid LAD.  The LAD is quite tortuous suggestive of hypertensive heart disease.  No significant disease involving the LMCA, LCX, and RCA. Normal left ventricular systolic function (LVEF 55-65%) and filling pressure (LVEDP 15  mmHg)..    COGNITION: Overall cognitive status: History of cognitive impairments - at baseline   SENSATION: WFL  COORDINATION: A to K - WFL     POSTURE: rounded shoulders and forward head  LOWER EXTREMITY ROM:     WFL  LOWER EXTREMITY MMT:    MMT Right Eval Left Eval  Hip flexion 4+ 4+  Hip extension    Hip abduction 4+ 4+  Hip adduction 5 5  Hip internal rotation    Hip  external rotation    Knee flexion 4+ 4+  Knee extension 5 5  Ankle dorsiflexion 5 5  Ankle plantarflexion 4 4  Ankle inversion    Ankle eversion    (Blank rows = not tested)  BED MOBILITY:  Not tested  TRANSFERS: Sit to stand: Complete Independence  Assistive device utilized: None     Stand to sit: Complete Independence  Assistive device utilized: None     Chair to chair: Complete Independence  Assistive device utilized: None       RAMP:  Not tested  CURB:  Findings: Mod I wit self selected UE support on rail   STAIRS: Findings: Level of Assistance: Modified independence, Number of Stairs: 4, Height of Stairs: 4   , and Comments: light UE support  GAIT: Findings: Gait Characteristics: decreased trunk rotation, Distance walked: 1565ft, Assistive device utilized:None, Level of assistance: Complete Independence, and Comments: reports mild SOB 2/10 in 6 min walk test.   FUNCTIONAL TESTS:  5 times sit to stand: 10.16  30 seconds chair stand test 16.5 HR increased to 105 decreased to 78 within 45 sec rest  6 minute walk test: 1593ft  PATIENT SURVEYS:   PT Vital signs   02/17/2024  PT assessed VS at start of PT treatment:  Sitting: 96/53(67) HR 79  Standing: 90/41 (55) HR 91  Standing 2 min: 78/60 (67) HR 99 mildly symptomatic.  02/21/2024 PT assessed VS at start of PT treatment:  Sitting: 117/67 HR 78 Standing 0 min:  107/58 HR 76 Standing 2 min: 109/51 HR 76 Reassessed in standing after 6 min walking:  124/62 HR 91.  VS post treatment:  Sitting: 117/66 HR 70 Standing: 114/67 HR 84 02/22/24 Sitting: 117/71 HR 70 Standing: 112/53 HR 82                                                                                                                               TREATMENT DATE: 02/29/2024  PT assessed VS at start of PT treatment:  Sitting: 107/56 HR 71 Standing: 99/66 HR 90 Standing 2 min 90/64 HR 87   Gait for 3 min (682ft without cues for assist from PT) able to  maintain conversation without issue throughout entire 673ft. But states mild SOB upon completion  VS re-assessment following gait 123/56 HR 96  Performed x 5 bouts  Sit<>stand with 1KG ball overhead press x 10  Gait x 3 min (670ft)  Hydration brake  of 1-2 min max between sets; pt reports onlt mild SOB after each bout of 3 min ambulation.   PATIENT EDUCATION:  Education details: POC. Benefits of skilled PT to address endurance and strengthen deficits; will benefit with transition of care to personal training or self directed fitness program to return to PLOF within several weeks   Transition of gym based fitness program in the next few weeks.    Person educated: Patient and Spouse Education method: Explanation and Demonstration Education comprehension: verbalized understanding and returned demonstration  HOME EXERCISE PROGRAM:  Access Code: [email or web address] URL: [email or web address] Date: 01/31/2024 Prepared by: Massie Dollar  Exercises - Seated March With Resistance at Feet  - 1 x daily - 5 x weekly - 3 sets - 10 reps - 2 seconds  hold - Standing Hip Extension with Resistance at Ankles and Counter Support  - 1 x daily - 5 x weekly - 3 sets - 10 reps - 2 seconds hold - Sit to Stand with Arms Crossed  - 1 x daily - 5 x weekly - 3 sets - 10 reps   GOALS: Goals reviewed with patient? Yes   SHORT TERM GOALS: Target date: 03/15/24    Patient will be independent in home exercise program to improve strength/mobility for better functional independence with ADLs. Baseline: to be initiated at visit 2.  Goal status: INITIAL   LONG TERM GOALS: Target date: 03/29/24    1.  Patient will increase 30 sec sit to stand by >2 full repetitions indicating an increased LE strength and improved balance. Baseline: 16.5 Goal status: INITIAL  2.  Patient will increase 6 min walk to at least 6 aged matched norm indicating improved function and tolerance to increased community activity   Baseline: 1531ft Goal status: INITIAL  4.  Patient will ascend a flight of stairs without pain and SOB only 2/10 to indicate improved access to community and family events.  Baseline: to be assessed Goal status: INITIAL    ASSESSMENT:  CLINICAL IMPRESSION: Patient is a 78 y.o. female who was seen today for physical therapy treatment for deconditioning and fatigue due to CAD and carotid artery stenosis. Improved BP noted on this day. Progress note to be completed at next PT treatment to assess progress towards LTGs. PT treatment focused on circuit based power and and endurance training to improve cardiovascular function. Pt's BP response to activity was appropriate with increased HR and BP with physical demand. Eduction to transition to community fitness center within the next few weeks.  Pt will benefit from Skilled PT treatment to address endurance and strength deficits to improve function and allow return to PLOF and improve overall QoL.   OBJECTIVE IMPAIRMENTS: Abnormal gait, cardiopulmonary status limiting activity, decreased activity tolerance, decreased cognition, decreased coordination, decreased endurance, decreased knowledge of condition, decreased knowledge of use of DME, decreased mobility, difficulty walking, decreased ROM, and decreased strength.   ACTIVITY LIMITATIONS: stairs and locomotion level  PARTICIPATION LIMITATIONS: shopping, community activity, and church  PERSONAL FACTORS: 1-2 comorbidities: CAD, HTN are also affecting patient's functional outcome.   REHAB POTENTIAL: Excellent  CLINICAL DECISION MAKING: Stable/uncomplicated  EVALUATION COMPLEXITY: Low  PLAN:  PT FREQUENCY: 1-2x/week  PT DURATION: other: 5 weeks   PLANNED INTERVENTIONS: 97164- PT Re-evaluation, 97750- Physical Performance Testing, 97110-Therapeutic exercises, 97530- Therapeutic activity, [id number]- Neuromuscular re-education, 97535- Self Care, [id number]- Manual therapy, [phone]- Gait training, [phone]-  Aquatic Therapy, Patient/Family education, Balance training, Stair training, Cognitive remediation, Cryotherapy, and Moist heat  PLAN FOR NEXT SESSION:  Progress note.  Assess BP.  Continue activity tolerance training with and without weighted ambulation and nustep.     Massie FORBES Dollar, PT 02/29/2024, 4:48 PM

## 2024-03-05 ENCOUNTER — Telehealth: Payer: Self-pay

## 2024-03-05 ENCOUNTER — Ambulatory Visit: Admitting: Physical Therapy

## 2024-03-05 DIAGNOSIS — R5381 Other malaise: Secondary | ICD-10-CM | POA: Diagnosis not present

## 2024-03-05 DIAGNOSIS — M6281 Muscle weakness (generalized): Secondary | ICD-10-CM

## 2024-03-05 NOTE — Telephone Encounter (Signed)
 Copied from CRM #[id number]. Topic: Clinical - Prescription Issue >> Mar 05, 2024 10:50 AM Rosina BIRCH wrote: Reason for CRM: [facility] called requesting a refill-oxybutynin  for the patient whose provider has retired. The patient went to [facility], but It looks the patient has another provider with [facility]. The patient called the pharmacy for a refill on a medication that is not on her current list   ----------------------------------------------------------------------- From previous Reason for Contact - Medication Refill: Medication: oxybutynin  ( did not see on current medication list)  Has the patient contacted their pharmacy? Yes (Agent: If no, request that the patient contact the pharmacy for the refill. If patient does not wish to contact the pharmacy document the reason why and proceed with request.) (Agent: If yes, when and what did the pharmacy advise?)  This is the patient's preferred pharmacy:  [id number] - [place], [place] - [address] AT [facility] [address] [place] [address] Phone: [phone] Fax: [phone]  Is this the correct pharmacy for this prescription? Yes If no, delete pharmacy and type the correct one.   Has the prescription been filled recently? No  Is the patient out of the medication? yes  Has the patient been seen for an appointment in the last year OR does the patient have an upcoming appointment? Yes  Can we respond through MyChart? Yes  Agent: Please be advised that Rx refills may take up to 3 business days. We ask that you follow-up with your pharmacy.

## 2024-03-05 NOTE — Telephone Encounter (Signed)
 Call was placed to [facility] spoke with Dyane ,and informed her patient is no longer taking Oxybutynin .

## 2024-03-05 NOTE — Therapy (Signed)
 " OUTPATIENT PHYSICAL THERAPY NEURO Treatment, / re-certification    Patient Name: Julia Snyder MRN: [id number] DOB:06/08/1945, 78 y.o., female Today's Date: 03/05/2024   PCP:    Laurence Locus, DO   REFERRING PROVIDER: Loistine Sober, NP   END OF SESSION:  PT End of Session - 03/05/24 0939     Visit Number 10    Number of Visits 15    Date for Recertification  03/29/24    PT Start Time 0936    PT Stop Time 1015    PT Time Calculation (min) 39 min    Equipment Utilized During Treatment Gait belt    Activity Tolerance Patient tolerated treatment well    Behavior During Therapy [facility] for tasks assessed/performed          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   Urge incontinence of urine    Vertigo 04/11/2018   Past Surgical History:  Procedure Laterality Date   BREAST BIOPSY Left 2004   benign   BUNIONECTOMY Bilateral    left ~2014, right 2018   COLONOSCOPY     ELBOW FRACTURE SURGERY Bilateral    right 2019, left ~2015   ENDARTERECTOMY Left 09/14/2023   Procedure: ENDARTERECTOMY, CAROTID;  Surgeon: Jama Cordella MATSU, MD;  Location: [facility] ORS;  Service: Vascular;  Laterality: Left;   FRACTURE SURGERY     KNEE ARTHROPLASTY Left 05/14/2022   Procedure: COMPUTER ASSISTED TOTAL KNEE ARTHROPLASTY - RNFA;  Surgeon: Mardee Lynwood SQUIBB, MD;  Location: [facility] ORS;  Service: Orthopedics;  Laterality: Left;   LEFT HEART CATH AND CORONARY ANGIOGRAPHY N/A 12/06/2023   Procedure: LEFT HEART CATH AND CORONARY ANGIOGRAPHY;  Surgeon: Mady Bruckner, MD;  Location: [facility]  INVASIVE CV LAB;  Service: Cardiovascular;  Laterality: N/A;   ORIF ELBOW FRACTURE Right 12/01/2017   Procedure: OPEN REDUCTION INTERNAL FIXATION (ORIF) ELBOW/OLECRANON FRACTURE;  Surgeon: Kathlynn Sharper, MD;  Location: [facility] ORS;  Service: Orthopedics;  Laterality: Right;   OTHER SURGICAL HISTORY  2015   left arm surgery with titanium plate   Patient Active Problem List   Diagnosis Date Noted   CAD (coronary artery disease), native coronary artery 02/23/2024   Essential hypertension 12/21/2023   Chronic fatigue 12/06/2023   Carotid stenosis - s/p left CEA 07/17/2023   Arthropathy of lumbar facet joint 06/20/2023   Lumbar spondylosis 06/20/2023   Low back pain 06/20/2023   Orthostatic hypotension 11/22/2022   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) 12/28/2021   Mood disorder 02/26/2019   Vertigo 04/11/2018   Encounter for health maintenance examination in adult 02/22/2018   Advance directive discussed with patient 02/22/2018   Urge incontinence of urine    Dyslipidemia 02/25/2017   Increased frequency of urination 02/25/2017   Hallux valgus, acquired 09/10/2016    ONSET DATE: > 6 months   REFERRING DIAG:  Diagnosis  R53.83 (ICD-10-CM) - Fatigue, unspecified type    THERAPY DIAG:  Physical deconditioning  Muscle weakness (generalized)  Rationale for Evaluation and Treatment: Rehabilitation  SUBJECTIVE:  SUBJECTIVE STATEMENT:  Husband present at end of PT treatment. Pt states that she is doing well. No significant updates.   No pain reported.    From Evaluation :   Reports hx of cardiac catheterization after stress test.   States that when she is walking, will get some SOB after 1/4 of a mile. Husband states that she was walking >3 miles prior to  cardiac issues.    Reports multiple bouts of near syncopal episodes within the last few weeks. Including once at PT at [facility] for 3-4 weeks for balance   Husband feels that Cardiac rehab may be more appropriate for management of endurance deficits.   Pt accompanied by: significant other  PERTINENT HISTORY:   From recent MD visit CAD Positive stress echo 11/24/2023.  LHC 12/06/2023 demonstrated moderate single-vessel CAD with recommendation for medical therapy.  Patient continues to have fatigue. Patient's husband inquired about cardiac rehab or physical therapy (see below). Discussed continuing to monitor symptoms and patient's husband is in agreement.  Right wrist cath site is well healed with very mild resolving ecchymosis and no erythema, edema, drainage or tenderness. 2+ radial pulse.  - Continue amlodipine , atorvastatin , aspirin .   Carotid artery stenosis S/p left carotid endarterectomy 09/14/2023.  Carotid ultrasound 09/28/2023 demonstrated no evidence of significant atherosclerotic changes or flow-limiting stenosis in the left carotid system, right ICA <50%. No further presyncopal or syncopal events.  - Continue aspirin , atorvastatin . - Continue to follow with vascular surgery.   Hypertension BP today 132/70.  - Continue amlodipine .   Presyncope 2 weeks ZIO 12/01/2023 demonstrated HR 48, 94 bpm, average 71 bpm, 24 runs of NSVT longest 21 beats.  Patient's husband denies further presyncopal events.  - Continue to monitor.    Fatigue Patient continues to experience increased fatigue per her husband. Discussed a component of deconditioning given the procedures and testing patient had over the last few months. Patient's husband requests referral for cardiac rehab or physical therapy. Referral will be placed for physical therapy at [facility]. Discussed close follow up to assess improvement.  - Refer to physical therapy for aerobic/strength training.  PAIN:  Are you having pain?  No  PRECAUTIONS: Fall and Other: near syncope   RED FLAGS: Bowel or bladder incontinence: Yes: bladder management    WEIGHT BEARING RESTRICTIONS: No  FALLS: Has patient fallen in last 6 months? Yes. Number of falls 2 near syncopal episodes.   LIVING ENVIRONMENT: Lives with: lives with their spouse Lives in: House/apartment Stairs: No Has following equipment at home: None  PLOF: Independent, Independent with basic ADLs, Independent with household mobility without device, and Independent with community mobility without device  PATIENT GOALS: improve endurance.   OBJECTIVE:  Note: Objective measures were completed at Evaluation unless otherwise noted.  DIAGNOSTIC FINDINGS:   US  Carotid  IMPRESSION: 1. Right carotid system: Mild atherosclerotic changes in the proximal right internal carotid artery is estimated stenosis measuring less than 50%. 2. Left carotid system: No evidence of significant atherosclerotic change or flow-limiting stenosis. Within normal limits.   CARDIAC CATHETERIZATION [[id number]] Resulted: 12/06/23 1437  Order Status: Completed Updated: 12/07/23 0754  Narrative:    Conclusions: Moderate single-vessel coronary artery disease, with sequential 50-60% lesions in the mid LAD.  The LAD is quite tortuous suggestive of hypertensive heart disease.  No significant disease involving the LMCA, LCX, and RCA. Normal left ventricular systolic function (LVEF 55-65%) and filling pressure (LVEDP 15 mmHg)..    COGNITION: Overall cognitive status: History of cognitive impairments -  at baseline   SENSATION: WFL  COORDINATION: A to K - WFL     POSTURE: rounded shoulders and forward head  LOWER EXTREMITY ROM:     WFL  LOWER EXTREMITY MMT:    MMT Right Eval Left Eval  Hip flexion 4+ 4+  Hip extension    Hip abduction 4+ 4+  Hip adduction 5 5  Hip internal rotation    Hip external rotation    Knee flexion 4+ 4+  Knee extension 5 5  Ankle  dorsiflexion 5 5  Ankle plantarflexion 4 4  Ankle inversion    Ankle eversion    (Blank rows = not tested)  BED MOBILITY:  Not tested  TRANSFERS: Sit to stand: Complete Independence  Assistive device utilized: None     Stand to sit: Complete Independence  Assistive device utilized: None     Chair to chair: Complete Independence  Assistive device utilized: None       RAMP:  Not tested  CURB:  Findings: Mod I wit self selected UE support on rail   STAIRS: Findings: Level of Assistance: Modified independence, Number of Stairs: 4, Height of Stairs: 4   , and Comments: light UE support  GAIT: Findings: Gait Characteristics: decreased trunk rotation, Distance walked: 1552ft, Assistive device utilized:None, Level of assistance: Complete Independence, and Comments: reports mild SOB 2/10 in 6 min walk test.   FUNCTIONAL TESTS:  5 times sit to stand: 10.16  30 seconds chair stand test 16.5 HR increased to 105 decreased to 78 within 45 sec rest  6 minute walk test: 1542ft  PATIENT SURVEYS:   PT Vital signs   02/17/2024  PT assessed VS at start of PT treatment:  Sitting: 96/53(67) HR 79  Standing: 90/41 (55) HR 91  Standing 2 min: 78/60 (67) HR 99 mildly symptomatic.  02/21/2024 PT assessed VS at start of PT treatment:  Sitting: 117/67 HR 78 Standing 0 min:  107/58 HR 76 Standing 2 min: 109/51 HR 76 Reassessed in standing after 6 min walking:  124/62 HR 91.  VS post treatment:  Sitting: 117/66 HR 70 Standing: 114/67 HR 84 02/22/24 Sitting: 117/71 HR 70 Standing: 112/53 HR 82                                                                                                                               TREATMENT DATE: 03/05/2024    30 sec sit<>stand: 20 Below average norms: 60-64 M< 14 F< 12; 65-69 M< 12 F< 11; 70-74 M< 12 F< 10; 75-79 M< 11 F< 10; 80-84 M< 10 F< 9; 85-89 M< 8 M< 8; 90-94 M< 7 F< 4.  A below average score indicates a risk for falls.  [facility] STEADI tools and  resources  6 Min Walk Test:  Instructed patient to ambulate as quickly and as safely as possible for 6 minutes using LRAD. Patient was allowed to take standing rest breaks without stopping the test, but  if the patient required a sitting rest break the clock would be stopped and the test would be over.  Results: 1655 feet (504 meters, Avg speed 1.48m/s) using no with no assist from PT; mild SOB noted after 4 min, but able to complete full 6 min. Results indicate that the patient has reduced endurance with ambulation compared to age matched norms.  Age Matched Norms: 56-69 yo M: 68 F: 28, 62-79 yo M: 26 F: 471, 74-89 yo M: 417 F: 392 MDC: 58.21 meters (190.98 feet) or 50 meters (ANPTA Core Set of Outcome Measures for Adults with Neurologic Conditions, 2018)  PT assessed VS following 6 min walk test after 2 min rest break  111/63 HR 72  Stair management to ascend/descend 1.5 flights (30 steps) no UE support on ascent. Pt rates SOB 5/10 upon completion. Required use of UE support on descent due to fear of heights.  VS assessed upon completion: 126/68 HR 87  Leg press BLE; 85# 3 x 8 1 min rest break between bouts.     PATIENT EDUCATION:  Education details: POC. Benefits of skilled PT to address endurance and strengthen deficits; will benefit with transition of care to personal training or self directed fitness program to return to PLOF within several weeks   Continued education on  benefits of Transition of gym based fitness program in the next few weeks.    Person educated: Patient and Spouse Education method: Explanation and Demonstration Education comprehension: verbalized understanding and returned demonstration  HOME EXERCISE PROGRAM:  Access Code: [email or web address] URL: [email or web address] Date: 01/31/2024 Prepared by: Massie Dollar  Exercises - Seated March With Resistance at Feet  - 1 x daily - 5 x weekly - 3 sets - 10 reps - 2 seconds  hold - Standing Hip Extension  with Resistance at Ankles and Counter Support  - 1 x daily - 5 x weekly - 3 sets - 10 reps - 2 seconds hold - Sit to Stand with Arms Crossed  - 1 x daily - 5 x weekly - 3 sets - 10 reps   GOALS: Goals reviewed with patient? Yes   SHORT TERM GOALS: Target date: 03/15/24    Patient will be independent in home exercise program to improve strength/mobility for better functional independence with ADLs. Baseline: to be initiated at visit 2.  12/25: consistent per husband report. Goal status: MET   LONG TERM GOALS: Target date: 03/29/24    1.  Patient will increase 30 sec sit to stand by >2 full repetitions indicating an increased LE strength and improved balance. Baseline: 16.5 03/05/2024: 20  Goal status: MET  2.  Patient will increase 6 min walk to at least  aged matched norm(1545) indicating improved function and tolerance to increased community activity  Baseline: 1568ft 03/05/2024: 1655 Goal status: MET  4.  Patient will ascend a flight of stairs without pain and SOB only 2/10 to indicate improved access to community and family events.  Baseline: to be assessed 03/08/2024: 5/10 Goal status: INITIAL    ASSESSMENT:  CLINICAL IMPRESSION: Patient is a 78 y.o. female who was seen today for physical therapy treatment for deconditioning and fatigue due to CAD and carotid artery stenosis. Improved BP following activity continued on this day., Progress note completed at this PT treatment to assess progress towards LTGs. Pt has met 2 of 3 LTG and 1 of 1 STG. Increased activity tolerance noted with 6 min walk test beyond aged matched norms as well as significantly improved 30 sec  sit<>stand to 20 reps. Pt still reports SOB with stair management but upon further questioning, states that it was not that bad. Will d/c after next PT treatment with plan to transition to community based fitness program.  Patient's condition has the potential to improve in response to therapy. Maximum  improvement is yet to be obtained. The anticipated improvement is attainable and reasonable in a generally predictable time.Pt will benefit from Skilled PT treatment to address endurance and strength deficits to improve function and allow return to PLOF and improve overall QoL.   OBJECTIVE IMPAIRMENTS: Abnormal gait, cardiopulmonary status limiting activity, decreased activity tolerance, decreased cognition, decreased coordination, decreased endurance, decreased knowledge of condition, decreased knowledge of use of DME, decreased mobility, difficulty walking, decreased ROM, and decreased strength.   ACTIVITY LIMITATIONS: stairs and locomotion level  PARTICIPATION LIMITATIONS: shopping, community activity, and church  PERSONAL FACTORS: 1-2 comorbidities: CAD, HTN are also affecting patient's functional outcome.   REHAB POTENTIAL: Excellent  CLINICAL DECISION MAKING: Stable/uncomplicated  EVALUATION COMPLEXITY: Low  PLAN:  PT FREQUENCY: 1-2x/week  PT DURATION: other: 5 weeks   PLANNED INTERVENTIONS: 97164- PT Re-evaluation, 97750- Physical Performance Testing, 97110-Therapeutic exercises, 97530- Therapeutic activity, [id number]- Neuromuscular re-education, 97535- Self Care, [id number]- Manual therapy, [phone]- Gait training, [phone]- Aquatic Therapy, Patient/Family education, Balance training, Stair training, Cognitive remediation, Cryotherapy, and Moist heat  PLAN FOR NEXT SESSION:   D/c with plans for community fitness program.     Massie FORBES Dollar, PT 03/05/2024, 9:39 AM        "

## 2024-03-07 ENCOUNTER — Ambulatory Visit: Admitting: Physical Therapy

## 2024-03-12 ENCOUNTER — Ambulatory Visit: Admitting: Physical Therapy

## 2024-03-12 DIAGNOSIS — M6281 Muscle weakness (generalized): Secondary | ICD-10-CM

## 2024-03-12 DIAGNOSIS — R5381 Other malaise: Secondary | ICD-10-CM

## 2024-03-12 NOTE — Therapy (Signed)
 " OUTPATIENT PHYSICAL THERAPY NEURO Treatment/  PT Discharge Summary.    Patient Name: Julia Snyder MRN: [id number] DOB:03/02/1946, 78 y.o., female Today's Date: 03/12/2024   PCP:    Laurence Locus, DO   REFERRING PROVIDER: Loistine Sober, NP   END OF SESSION:  PT End of Session - 03/12/24 0940     Visit Number 11    Number of Visits 15    Date for Recertification  03/29/24    PT Start Time 0937    PT Stop Time 1015    PT Time Calculation (min) 38 min    Equipment Utilized During Treatment Gait belt    Activity Tolerance Patient tolerated treatment well    Behavior During Therapy [facility] for tasks assessed/performed          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   Urge incontinence of urine    Vertigo 04/11/2018   Past Surgical History:  Procedure Laterality Date   BREAST BIOPSY Left 2004   benign   BUNIONECTOMY Bilateral    left ~2014, right 2018   COLONOSCOPY     ELBOW FRACTURE SURGERY Bilateral    right 2019, left ~2015   ENDARTERECTOMY Left 09/14/2023   Procedure: ENDARTERECTOMY, CAROTID;  Surgeon: Jama Cordella MATSU, MD;  Location: [facility] ORS;  Service: Vascular;  Laterality: Left;   FRACTURE SURGERY     KNEE ARTHROPLASTY Left 05/14/2022   Procedure: COMPUTER ASSISTED TOTAL KNEE ARTHROPLASTY - RNFA;  Surgeon: Mardee Lynwood SQUIBB, MD;  Location: [facility] ORS;  Service: Orthopedics;  Laterality: Left;   LEFT HEART CATH AND CORONARY ANGIOGRAPHY N/A 12/06/2023   Procedure: LEFT HEART CATH AND CORONARY ANGIOGRAPHY;  Surgeon: Mady Bruckner, MD;  Location: [facility]  INVASIVE CV LAB;  Service: Cardiovascular;  Laterality: N/A;   ORIF ELBOW FRACTURE Right 12/01/2017   Procedure: OPEN REDUCTION INTERNAL FIXATION (ORIF) ELBOW/OLECRANON FRACTURE;  Surgeon: Kathlynn Sharper, MD;  Location: [facility] ORS;  Service: Orthopedics;  Laterality: Right;   OTHER SURGICAL HISTORY  2015   left arm surgery with titanium plate   Patient Active Problem List   Diagnosis Date Noted   CAD (coronary artery disease), native coronary artery 02/23/2024   Essential hypertension 12/21/2023   Chronic fatigue 12/06/2023   Carotid stenosis - s/p left CEA 07/17/2023   Arthropathy of lumbar facet joint 06/20/2023   Lumbar spondylosis 06/20/2023   Low back pain 06/20/2023   Orthostatic hypotension 11/22/2022   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) 12/28/2021   Mood disorder 02/26/2019   Vertigo 04/11/2018   Encounter for health maintenance examination in adult 02/22/2018   Advance directive discussed with patient 02/22/2018   Urge incontinence of urine    Dyslipidemia 02/25/2017   Increased frequency of urination 02/25/2017   Hallux valgus, acquired 09/10/2016    ONSET DATE: > 6 months   REFERRING DIAG:  Diagnosis  R53.83 (ICD-10-CM) - Fatigue, unspecified type    THERAPY DIAG:  Physical deconditioning  Muscle weakness (generalized)  Rationale for Evaluation and Treatment: Rehabilitation  SUBJECTIVE:  SUBJECTIVE STATEMENT:  Husband present at end of PT treatment. Pt reports that she is doing well, but feels a little off more tired than usual.   Had a good Christmas holiday. Was able to see daughter.  Feels ready for d/c from PT and transition to community fitness program.    From Evaluation :   Reports hx of cardiac catheterization after  stress test.   States that when she is walking, will get some SOB after 1/4 of a mile. Husband states that she was walking >3 miles prior to cardiac issues.    Reports multiple bouts of near syncopal episodes within the last few weeks. Including once at PT at [facility] for 3-4 weeks for balance   Husband feels that Cardiac rehab may be more appropriate for management of endurance deficits.   Pt accompanied by: significant other  PERTINENT HISTORY:   From recent MD visit CAD Positive stress echo 11/24/2023.  LHC 12/06/2023 demonstrated moderate single-vessel CAD with recommendation for medical therapy.  Patient continues to have fatigue. Patient's husband inquired about cardiac rehab or physical therapy (see below). Discussed continuing to monitor symptoms and patient's husband is in agreement.  Right wrist cath site is well healed with very mild resolving ecchymosis and no erythema, edema, drainage or tenderness. 2+ radial pulse.  - Continue amlodipine , atorvastatin , aspirin .   Carotid artery stenosis S/p left carotid endarterectomy 09/14/2023.  Carotid ultrasound 09/28/2023 demonstrated no evidence of significant atherosclerotic changes or flow-limiting stenosis in the left carotid system, right ICA <50%. No further presyncopal or syncopal events.  - Continue aspirin , atorvastatin . - Continue to follow with vascular surgery.   Hypertension BP today 132/70.  - Continue amlodipine .   Presyncope 2 weeks ZIO 12/01/2023 demonstrated HR 48, 94 bpm, average 71 bpm, 24 runs of NSVT longest 21 beats.  Patient's husband denies further presyncopal events.  - Continue to monitor.    Fatigue Patient continues to experience increased fatigue per her husband. Discussed a component of deconditioning given the procedures and testing patient had over the last few months. Patient's husband requests referral for cardiac rehab or physical therapy. Referral will be placed for physical therapy at [facility].  Discussed close follow up to assess improvement.  - Refer to physical therapy for aerobic/strength training.  PAIN:  Are you having pain? No  PRECAUTIONS: Fall and Other: near syncope   RED FLAGS: Bowel or bladder incontinence: Yes: bladder management    WEIGHT BEARING RESTRICTIONS: No  FALLS: Has patient fallen in last 6 months? Yes. Number of falls 2 near syncopal episodes.   LIVING ENVIRONMENT: Lives with: lives with their spouse Lives in: House/apartment Stairs: No Has following equipment at home: None  PLOF: Independent, Independent with basic ADLs, Independent with household mobility without device, and Independent with community mobility without device  PATIENT GOALS: improve endurance.   OBJECTIVE:  Note: Objective measures were completed at Evaluation unless otherwise noted.  DIAGNOSTIC FINDINGS:   US  Carotid  IMPRESSION: 1. Right carotid system: Mild atherosclerotic changes in the proximal right internal carotid artery is estimated stenosis measuring less than 50%. 2. Left carotid system: No evidence of significant atherosclerotic change or flow-limiting stenosis. Within normal limits.   CARDIAC CATHETERIZATION [[id number]] Resulted: 12/06/23 1437  Order Status: Completed Updated: 12/07/23 0754  Narrative:    Conclusions: Moderate single-vessel coronary artery disease, with sequential 50-60% lesions in the mid LAD.  The LAD is quite tortuous suggestive of hypertensive heart disease.  No significant disease involving the LMCA, LCX,  and RCA. Normal left ventricular systolic function (LVEF 55-65%) and filling pressure (LVEDP 15 mmHg)..    COGNITION: Overall cognitive status: History of cognitive impairments - at baseline   SENSATION: WFL  COORDINATION: A to K - WFL     POSTURE: rounded shoulders and forward head  LOWER EXTREMITY ROM:     WFL  LOWER EXTREMITY MMT:    MMT Right Eval Left Eval  Hip flexion 4+ 4+  Hip extension    Hip  abduction 4+ 4+  Hip adduction 5 5  Hip internal rotation    Hip external rotation    Knee flexion 4+ 4+  Knee extension 5 5  Ankle dorsiflexion 5 5  Ankle plantarflexion 4 4  Ankle inversion    Ankle eversion    (Blank rows = not tested)  BED MOBILITY:  Not tested  TRANSFERS: Sit to stand: Complete Independence  Assistive device utilized: None     Stand to sit: Complete Independence  Assistive device utilized: None     Chair to chair: Complete Independence  Assistive device utilized: None       RAMP:  Not tested  CURB:  Findings: Mod I wit self selected UE support on rail   STAIRS: Findings: Level of Assistance: Modified independence, Number of Stairs: 4, Height of Stairs: 4   , and Comments: light UE support  GAIT: Findings: Gait Characteristics: decreased trunk rotation, Distance walked: 1542ft, Assistive device utilized:None, Level of assistance: Complete Independence, and Comments: reports mild SOB 2/10 in 6 min walk test.   FUNCTIONAL TESTS:  5 times sit to stand: 10.16  30 seconds chair stand test 16.5 HR increased to 105 decreased to 78 within 45 sec rest  6 minute walk test: 1525ft  PATIENT SURVEYS:   PT Vital signs   02/17/2024  PT assessed VS at start of PT treatment:  Sitting: 96/53(67) HR 79  Standing: 90/41 (55) HR 91  Standing 2 min: 78/60 (67) HR 99 mildly symptomatic.  02/21/2024 PT assessed VS at start of PT treatment:  Sitting: 117/67 HR 78 Standing 0 min:  107/58 HR 76 Standing 2 min: 109/51 HR 76 Reassessed in standing after 6 min walking:  124/62 HR 91.  VS post treatment:  Sitting: 117/66 HR 70 Standing: 114/67 HR 84 02/22/24 Sitting: 117/71 HR 70 Standing: 112/53 HR 82                                                                                                                               TREATMENT DATE: 03/12/2024  Education for walking program based on time, rather than distance to allow progressive overload.   UE/LE aerobic  priming x 4 min on nustep; educated pt to maintain speed with SOB rated only 2-3/10.   Strength assessment for max reps on fitness equipment:   Hoist Leg press: 173# x 10 reps  Hoist knee extension: 54lbs x 10 reps  Hoist hamstring curl: 32lbs x 10reps  Hoist Shoulder press 17lbs x6 reps Hoist chest press 17lbs x 7reps  Reviewed progressive walking program.     PATIENT EDUCATION:  Education details: POC. Benefits of skilled PT to address endurance and strengthen deficits; will benefit with transition of care to personal training or self directed fitness program to return to PLOF within several weeks   Continued education on  benefits of Transition of gym based fitness program in the next few weeks.    Person educated: Patient and Spouse Education method: Explanation and Demonstration Education comprehension: verbalized understanding and returned demonstration  HOME EXERCISE PROGRAM:  Access Code: [email or web address] URL: [email or web address] Date: 01/31/2024 Prepared by: Massie Dollar  Exercises - Seated March With Resistance at Feet  - 1 x daily - 5 x weekly - 3 sets - 10 reps - 2 seconds  hold - Standing Hip Extension with Resistance at Ankles and Counter Support  - 1 x daily - 5 x weekly - 3 sets - 10 reps - 2 seconds hold - Sit to Stand with Arms Crossed  - 1 x daily - 5 x weekly - 3 sets - 10 reps   GOALS: Goals reviewed with patient? Yes   SHORT TERM GOALS: Target date: 03/15/24    Patient will be independent in home exercise program to improve strength/mobility for better functional independence with ADLs. Baseline: to be initiated at visit 2.  12/25: consistent per husband report. Goal status: MET   LONG TERM GOALS: Target date: 03/29/24    1.  Patient will increase 30 sec sit to stand by >2 full repetitions indicating an increased LE strength and improved balance. Baseline: 16.5 03/05/2024: 20  Goal status: MET  2.  Patient will increase 6 min  walk to at least  aged matched norm(1545) indicating improved function and tolerance to increased community activity  Baseline: 1535ft 03/05/2024: 1655 Goal status: MET  4.  Patient will ascend a flight of stairs without pain and SOB only 2/10 to indicate improved access to community and family events.  Baseline: to be assessed 03/08/2024: 5/10 Goal status: IN PROGRESS    ASSESSMENT:  CLINICAL IMPRESSION: Patient is a 78 y.o. female who was seen today for physical therapy treatment for deconditioning and fatigue due to CAD and carotid artery stenosis.  Improved BP following activity aver the last few sessions. Pt noted to have met 3 of 4 LTG in last session for progress note. Will be transitioning into community fitness program. Weight machine max reps calculated on this day for initiation of progressive overload fitness program. Also continued to educate on progress walking program with emphasis on time spent training, rather than distance focused. Due to improved BP and HR response to fitness program and increased functional capacity found in progress note assessment, pt will no longer benefit from skilled PT.    OBJECTIVE IMPAIRMENTS: Abnormal gait, cardiopulmonary status limiting activity, decreased activity tolerance, decreased cognition, decreased coordination, decreased endurance, decreased knowledge of condition, decreased knowledge of use of DME, decreased mobility, difficulty walking, decreased ROM, and decreased strength.   ACTIVITY LIMITATIONS: stairs and locomotion level  PARTICIPATION LIMITATIONS: shopping, community activity, and church  PERSONAL FACTORS: 1-2 comorbidities: CAD, HTN are also affecting patient's functional outcome.   REHAB POTENTIAL: Excellent  CLINICAL DECISION MAKING: Stable/uncomplicated  EVALUATION COMPLEXITY: Low  PLAN:  PT FREQUENCY: 1-2x/week  PT DURATION: other: 5 weeks   PLANNED INTERVENTIONS: 97164- PT Re-evaluation, 97750- Physical  Performance Testing, 97110-Therapeutic exercises, 97530- Therapeutic activity, [id number]- Neuromuscular re-education, 97535- Self Care, 02859- Manual  therapy, [phone]- Gait training, 02886- Aquatic Therapy, Patient/Family education, Balance training, Stair training, Cognitive remediation, Cryotherapy, and Moist heat  PLAN FOR NEXT SESSION:  N/A: D/C'ed from PT.   Massie FORBES Dollar, PT 03/12/2024, 9:40 AM        "

## 2024-03-14 ENCOUNTER — Ambulatory Visit: Admitting: Physical Therapy

## 2024-03-19 ENCOUNTER — Ambulatory Visit: Admitting: Physical Therapy

## 2024-03-21 ENCOUNTER — Ambulatory Visit: Admitting: Physical Therapy

## 2024-03-26 ENCOUNTER — Ambulatory Visit

## 2024-03-28 ENCOUNTER — Ambulatory Visit: Admitting: Physical Therapy

## 2024-04-02 ENCOUNTER — Ambulatory Visit: Admitting: Physical Therapy

## 2024-04-04 ENCOUNTER — Ambulatory Visit: Admitting: Physical Therapy

## 2024-04-04 NOTE — Progress Notes (Unsigned)
 "  Cardiology Clinic Note   Date: 04/04/2024 ID: Julia Snyder, DOB [birth year], MRN [id number]  Primary Cardiologist:  Evalene Lunger, MD  Chief Complaint   Julia Snyder is a 79 y.o. female who presents to the clinic today for ***  Patient Profile   Julia Snyder is followed by *** for the history outlined below.       Past medical history significant for: CAD. Stress echo 11/24/2023: Hypokinesis of the apical septal segment and apex at peak stress concerning for LAD ischemia.  Normal HR/BP response to exercise.  Positive stress echo for ischemia in the distribution of the LAD.  Intermediate risk study.  LHC 12/06/2023: Moderate single-vessel coronary artery disease with sequential 50 to 60% lesion in the mid LAD.  LAD is quite tortuous suggestive of hypertensive heart disease.  No significant disease involving the LMCA, LCx, and RCA. Carotid artery stenosis. CTA neck 07/05/2023: Approximately 75% stenosis of proximal left ICA. Left carotid endarterectomy 09/14/2023. Carotid ultrasound 09/28/2023: Mild atherosclerotic changes in the proximal right ICA <50%.  No evidence of significant atherosclerotic changes or flow-limiting stenosis left carotid system. Hypertension. Hyperlipidemia. Mild cognitive impairment with memory loss. Presyncope. 2 weeks ZIO 12/01/2023: HR 48 to 194 bpm, average 71 bpm.  24 runs of NSVT longest 21 beats.  No A-fib detected.  In summary, patient has a history of carotid artery stenosis with critical stenosis of left ICA.  She underwent carotid endarterectomy on 09/14/2023.  Patient was first evaluated by Dr. Gollan on 12/01/2023 for positive stress test.  She had been seen by [facility] cardiology on 11/17/2023 with reports of fatigue and episode of near syncope.  She reported 2 weeks post endarterectomy she broke out in a cold sweat for about 10 to 15 minutes and was 90% passed out per her husband.  She rested in bed and about 30 minutes later she felt better.  She saw the  nurse at [facility] and was told to present to the ER.  She was kept overnight with no significant findings.  3 weeks prior to visit with Dr. Gollan she was in physical therapy when she became extremely weak and could barely talk prompting EMS to be called.  She did not lose consciousness but felt very weak with persistent fatigue but declined ER.  She underwent stress echo on 11/24/2023 which was intermediate risk with concern for ischemia in the distribution of the LAD.  She agreed to [facility] which was performed on 12/06/2023 showing moderate single-vessel CAD as detailed above.  Near syncope was attributed to possible vasovagal event.   Patient was last seen in the office by me on 01/02/2024 for follow up after testing. She reported continued fatigue with husband reporting patient previously walking 1-2 miles daily and currently giving out after 1/4-1/2 mile. She was referred to PT.      History of Present Illness    Today, patient ***  CAD Positive stress echo 11/24/2023.  LHC 12/06/2023 demonstrated moderate single-vessel CAD with recommendation for medical therapy.  Patient *** - Continue amlodipine , atorvastatin , aspirin .   Carotid artery stenosis S/p left carotid endarterectomy 09/14/2023.  Carotid ultrasound 09/28/2023 demonstrated no evidence of significant atherosclerotic changes or flow-limiting stenosis in the left carotid system, right ICA <50%. Patient *** - Continue aspirin , atorvastatin . - Continue to follow with vascular surgery.   Hypertension BP today ***.  - Continue amlodipine .   Presyncope 2 weeks ZIO 12/01/2023 demonstrated HR 48, 94 bpm, average 71 bpm, 24 runs of NSVT  longest 21 beats.  Patient *** - Continue to monitor.    Fatigue Patient ***  ROS: All other systems reviewed and are otherwise negative except as noted in History of Present Illness.  EKGs/Labs Reviewed        09/28/2023: ALT 24; AST 27 12/01/2023: BUN 16; Creatinine, Ser 0.73; Potassium 4.5; Sodium  137   12/01/2023: Hemoglobin 13.6; WBC 7.5   02/16/2024: TSH 1.12   No results found for requested labs within last 365 days.  ***  Risk Assessment/Calculations    {Does this patient have ATRIAL FIBRILLATION?[phone]} No BP recorded.  {Refresh Note OR Click here to enter BP  :1}***        Physical Exam    VS:  There were no vitals taken for this visit. , BMI There is no height or weight on file to calculate BMI.  GEN: Well nourished, well developed, in no acute distress. Neck: No JVD or carotid bruits. Cardiac: *** RRR. *** No murmur. No rubs or gallops.   Respiratory:  Respirations regular and unlabored. Clear to auscultation without rales, wheezing or rhonchi. GI: Soft, nontender, nondistended. Extremities: Radials/DP/PT 2+ and equal bilaterally. No clubbing or cyanosis. No edema ***  Skin: Warm and dry, no rash. Neuro: Strength intact.  Assessment & Plan   ***  Disposition: ***     {Are you ordering a CV Procedure (e.g. stress test, cath, DCCV, TEE, etc)?   Press F2        :[id number]}   Signed, Barnie HERO. Jules Vidovich, DNP, NP-C  "

## 2024-04-09 ENCOUNTER — Ambulatory Visit: Admitting: Physical Therapy

## 2024-04-09 ENCOUNTER — Ambulatory Visit: Admitting: Student

## 2024-04-11 ENCOUNTER — Ambulatory Visit: Admitting: Physical Therapy

## 2024-04-13 ENCOUNTER — Ambulatory Visit: Attending: Student | Admitting: Student

## 2024-04-13 ENCOUNTER — Encounter: Payer: Self-pay | Admitting: Student

## 2024-04-13 VITALS — BP 100/60 | HR 81 | Resp 18 | Ht 63.0 in | Wt 141.4 lb

## 2024-04-13 DIAGNOSIS — I6523 Occlusion and stenosis of bilateral carotid arteries: Secondary | ICD-10-CM | POA: Insufficient documentation

## 2024-04-13 DIAGNOSIS — R55 Syncope and collapse: Secondary | ICD-10-CM | POA: Diagnosis present

## 2024-04-13 DIAGNOSIS — Z79899 Other long term (current) drug therapy: Secondary | ICD-10-CM | POA: Diagnosis present

## 2024-04-13 DIAGNOSIS — R5383 Other fatigue: Secondary | ICD-10-CM | POA: Diagnosis present

## 2024-04-13 DIAGNOSIS — I959 Hypotension, unspecified: Secondary | ICD-10-CM | POA: Insufficient documentation

## 2024-04-13 DIAGNOSIS — I2510 Atherosclerotic heart disease of native coronary artery without angina pectoris: Secondary | ICD-10-CM | POA: Insufficient documentation

## 2024-04-13 DIAGNOSIS — Z9889 Other specified postprocedural states: Secondary | ICD-10-CM | POA: Diagnosis present

## 2024-04-13 MED ORDER — AMLODIPINE BESYLATE 2.5 MG PO TABS: 2.5000 mg | ORAL_TABLET | Freq: Every day | ORAL | 3 refills | Status: AC

## 2024-04-13 NOTE — Patient Instructions (Signed)
 Medication Instructions:   Your physician recommends the following medication changes.  Hold Amlodipine  2.5 mg daily dose for blood pressures less than 105/60   *If you need a refill on your cardiac medications before your next appointment, please call your pharmacy*  Lab Work:  Your provider would like for you to have following labs drawn today BMet, CBC.    If you have labs (blood work) drawn today and your tests are completely normal, you will receive your results only by:  MyChart Message (if you have MyChart) OR  A paper copy in the mail If you have any lab test that is abnormal or we need to change your treatment, we will call you to review the results.  Testing/Procedures:  None ordered at this time   Referrals:  None ordered at this time   Follow-Up:  At [facility], you and your health needs are our priority.  As part of our continuing mission to provide you with exceptional heart care, our providers are all part of one team.  This team includes your primary Cardiologist (physician) and Advanced Practice Providers or APPs (Physician Assistants and Nurse Practitioners) who all work together to provide you with the care you need, when you need it.  Your next appointment:   3 month(s)  Provider:    Timothy Gollan, MD    We recommend signing up for the patient portal called MyChart.  Sign up information is provided on this After Visit Summary.  MyChart is used to connect with patients for Virtual Visits (Telemedicine).  Patients are able to view lab/test results, encounter notes, upcoming appointments, etc.  Non-urgent messages can be sent to your provider as well.   To learn more about what you can do with MyChart, go to [email or web address].

## 2024-04-14 LAB — BASIC METABOLIC PANEL WITH GFR
BUN/Creatinine Ratio: 28 (ref 12–28)
BUN: 23 mg/dL (ref 8–27)
CO2: 24 mmol/L (ref 20–29)
Calcium: 9.2 mg/dL (ref 8.7–10.3)
Chloride: 102 mmol/L (ref 96–106)
Creatinine, Ser: 0.81 mg/dL (ref 0.57–1.00)
Glucose: 70 mg/dL (ref 70–99)
Potassium: 4.4 mmol/L (ref 3.5–5.2)
Sodium: 143 mmol/L (ref 134–144)
eGFR: 74 mL/min/{1.73_m2}

## 2024-04-14 LAB — CBC
Hematocrit: 42.9 % (ref 34.0–46.6)
Hemoglobin: 13.4 g/dL (ref 11.1–15.9)
MCH: 28.2 pg (ref 26.6–33.0)
MCHC: 31.2 g/dL — ABNORMAL LOW (ref 31.5–35.7)
MCV: 90 fL (ref 79–97)
Platelets: 290 10*3/uL (ref 150–450)
RBC: 4.75 x10E6/uL (ref 3.77–5.28)
RDW: 12.9 % (ref 11.7–15.4)
WBC: 5.7 10*3/uL (ref 3.4–10.8)

## 2024-04-15 ENCOUNTER — Ambulatory Visit: Payer: Self-pay | Admitting: Student

## 2024-04-16 ENCOUNTER — Ambulatory Visit: Admitting: Physical Therapy

## 2024-04-18 ENCOUNTER — Ambulatory Visit: Admitting: Physical Therapy

## 2024-04-20 NOTE — Progress Notes (Signed)
 Letter Sent

## 2024-04-23 ENCOUNTER — Ambulatory Visit: Admitting: Physical Therapy

## 2024-04-25 ENCOUNTER — Ambulatory Visit: Admitting: Physical Therapy

## 2024-05-24 ENCOUNTER — Encounter: Admitting: Nurse Practitioner

## 2024-06-28 ENCOUNTER — Ambulatory Visit (INDEPENDENT_AMBULATORY_CARE_PROVIDER_SITE_OTHER): Admitting: Vascular Surgery

## 2024-06-28 ENCOUNTER — Encounter (INDEPENDENT_AMBULATORY_CARE_PROVIDER_SITE_OTHER)

## 2024-07-19 ENCOUNTER — Ambulatory Visit: Admitting: Student
